# Patient Record
Sex: Female | Born: 1945 | Race: White | Hispanic: No | Marital: Married | Smoking: Former smoker
Health system: Southern US, Community
[De-identification: ages and names within clinical notes are randomized; demographics above are authoritative.]

## PROBLEM LIST (undated history)

## (undated) DIAGNOSIS — C801 Malignant (primary) neoplasm, unspecified: Secondary | ICD-10-CM

## (undated) DIAGNOSIS — R945 Abnormal results of liver function studies: Secondary | ICD-10-CM

## (undated) DIAGNOSIS — K5730 Diverticulosis of large intestine without perforation or abscess without bleeding: Secondary | ICD-10-CM

## (undated) DIAGNOSIS — R7989 Other specified abnormal findings of blood chemistry: Secondary | ICD-10-CM

## (undated) DIAGNOSIS — I10 Essential (primary) hypertension: Secondary | ICD-10-CM

## (undated) DIAGNOSIS — Z853 Personal history of malignant neoplasm of breast: Secondary | ICD-10-CM

## (undated) DIAGNOSIS — K219 Gastro-esophageal reflux disease without esophagitis: Secondary | ICD-10-CM

## (undated) DIAGNOSIS — I719 Aortic aneurysm of unspecified site, without rupture: Secondary | ICD-10-CM

## (undated) DIAGNOSIS — E785 Hyperlipidemia, unspecified: Secondary | ICD-10-CM

## (undated) HISTORY — PX: UPPER GASTROINTESTINAL ENDOSCOPY: SHX188

## (undated) HISTORY — DX: Gastro-esophageal reflux disease without esophagitis: K21.9

## (undated) HISTORY — DX: Aortic aneurysm of unspecified site, without rupture: I71.9

## (undated) HISTORY — PX: COLONOSCOPY: SHX174

## (undated) HISTORY — DX: Essential (primary) hypertension: I10

## (undated) HISTORY — DX: Personal history of malignant neoplasm of breast: Z85.3

## (undated) HISTORY — DX: Hyperlipidemia, unspecified: E78.5

## (undated) HISTORY — DX: Other specified abnormal findings of blood chemistry: R79.89

## (undated) HISTORY — DX: Abnormal results of liver function studies: R94.5

## (undated) HISTORY — DX: Diverticulosis of large intestine without perforation or abscess without bleeding: K57.30

---

## 1993-09-02 DIAGNOSIS — Z853 Personal history of malignant neoplasm of breast: Secondary | ICD-10-CM

## 1993-09-02 HISTORY — DX: Personal history of malignant neoplasm of breast: Z85.3

## 1993-09-02 HISTORY — PX: MASTECTOMY: SHX3

## 1998-07-05 ENCOUNTER — Other Ambulatory Visit: Admission: RE | Admit: 1998-07-05 | Discharge: 1998-07-05 | Payer: Self-pay | Admitting: *Deleted

## 1998-12-27 ENCOUNTER — Encounter: Admission: RE | Admit: 1998-12-27 | Discharge: 1999-03-27 | Payer: Self-pay | Admitting: Orthopedic Surgery

## 1999-06-06 ENCOUNTER — Other Ambulatory Visit: Admission: RE | Admit: 1999-06-06 | Discharge: 1999-06-06 | Payer: Self-pay | Admitting: *Deleted

## 1999-08-16 ENCOUNTER — Encounter: Payer: Self-pay | Admitting: *Deleted

## 1999-08-16 ENCOUNTER — Encounter: Admission: RE | Admit: 1999-08-16 | Discharge: 1999-08-16 | Payer: Self-pay | Admitting: *Deleted

## 2000-06-11 ENCOUNTER — Other Ambulatory Visit: Admission: RE | Admit: 2000-06-11 | Discharge: 2000-06-11 | Payer: Self-pay | Admitting: *Deleted

## 2001-05-25 ENCOUNTER — Other Ambulatory Visit: Admission: RE | Admit: 2001-05-25 | Discharge: 2001-05-25 | Payer: Self-pay | Admitting: *Deleted

## 2002-06-25 ENCOUNTER — Other Ambulatory Visit: Admission: RE | Admit: 2002-06-25 | Discharge: 2002-06-25 | Payer: Self-pay | Admitting: *Deleted

## 2002-07-27 ENCOUNTER — Encounter: Payer: Self-pay | Admitting: Internal Medicine

## 2002-07-27 ENCOUNTER — Encounter: Admission: RE | Admit: 2002-07-27 | Discharge: 2002-07-27 | Payer: Self-pay | Admitting: Internal Medicine

## 2003-08-09 ENCOUNTER — Encounter: Admission: RE | Admit: 2003-08-09 | Discharge: 2003-08-09 | Payer: Self-pay | Admitting: Sports Medicine

## 2003-08-12 ENCOUNTER — Other Ambulatory Visit: Admission: RE | Admit: 2003-08-12 | Discharge: 2003-08-12 | Payer: Self-pay | Admitting: *Deleted

## 2003-09-20 ENCOUNTER — Encounter: Admission: RE | Admit: 2003-09-20 | Discharge: 2003-09-20 | Payer: Self-pay | Admitting: Sports Medicine

## 2004-06-28 ENCOUNTER — Encounter: Payer: Self-pay | Admitting: Internal Medicine

## 2004-07-18 ENCOUNTER — Other Ambulatory Visit: Admission: RE | Admit: 2004-07-18 | Discharge: 2004-07-18 | Payer: Self-pay | Admitting: *Deleted

## 2004-08-08 ENCOUNTER — Ambulatory Visit: Payer: Self-pay | Admitting: Internal Medicine

## 2004-10-29 ENCOUNTER — Encounter: Admission: RE | Admit: 2004-10-29 | Discharge: 2004-10-29 | Payer: Self-pay | Admitting: Orthopaedic Surgery

## 2005-06-06 ENCOUNTER — Other Ambulatory Visit: Admission: RE | Admit: 2005-06-06 | Discharge: 2005-06-06 | Payer: Self-pay | Admitting: *Deleted

## 2005-07-04 ENCOUNTER — Ambulatory Visit: Payer: Self-pay | Admitting: Internal Medicine

## 2005-07-17 ENCOUNTER — Ambulatory Visit: Payer: Self-pay | Admitting: Internal Medicine

## 2005-12-23 ENCOUNTER — Ambulatory Visit: Payer: Self-pay | Admitting: Internal Medicine

## 2006-06-02 LAB — CONVERTED CEMR LAB: Pap Smear: NORMAL

## 2006-06-17 ENCOUNTER — Ambulatory Visit: Payer: Self-pay | Admitting: Internal Medicine

## 2006-06-17 LAB — CONVERTED CEMR LAB
ALT: 29 units/L (ref 0–40)
AST: 32 units/L (ref 0–37)
Albumin: 4 g/dL (ref 3.5–5.2)
Alkaline Phosphatase: 51 units/L (ref 39–117)
BUN: 17 mg/dL (ref 6–23)
Basophils Absolute: 0 10*3/uL (ref 0.0–0.1)
Basophils Relative: 0.6 % (ref 0.0–1.0)
CO2: 27 meq/L (ref 19–32)
Calcium: 9.6 mg/dL (ref 8.4–10.5)
Chloride: 106 meq/L (ref 96–112)
Chol/HDL Ratio, serum: 3.2
Cholesterol: 237 mg/dL (ref 0–200)
Creatinine, Ser: 1 mg/dL (ref 0.4–1.2)
Eosinophil percent: 1.6 % (ref 0.0–5.0)
GFR calc non Af Amer: 60 mL/min
Glomerular Filtration Rate, Af Am: 73 mL/min/{1.73_m2}
Glucose, Bld: 82 mg/dL (ref 70–99)
HCT: 43.1 % (ref 36.0–46.0)
HDL: 73.7 mg/dL (ref >39.0)
Hemoglobin: 14.6 g/dL (ref 12.0–15.0)
LDL DIRECT: 147.7 mg/dL
Lymphocytes Relative: 30.9 % (ref 12.0–46.0)
MCHC: 33.9 g/dL (ref 30.0–36.0)
MCV: 92.7 fL (ref 78.0–100.0)
Monocytes Absolute: 0.2 10*3/uL (ref 0.2–0.7)
Monocytes Relative: 6 % (ref 3.0–11.0)
Neutro Abs: 1.9 10*3/uL (ref 1.4–7.7)
Neutrophils Relative %: 60.9 % (ref 43.0–77.0)
Platelets: 210 10*3/uL (ref 150–400)
Potassium: 4.3 meq/L (ref 3.5–5.1)
RBC: 4.65 M/uL (ref 3.87–5.11)
RDW: 11.9 % (ref 11.5–14.6)
Sodium: 141 meq/L (ref 135–145)
TSH: 1.88 microintl units/mL (ref 0.35–5.50)
Total Bilirubin: 1.7 mg/dL — ABNORMAL HIGH (ref 0.3–1.2)
Total Protein: 6.6 g/dL (ref 6.0–8.3)
Triglyceride fasting, serum: 48 mg/dL (ref 0–149)
VLDL: 10 mg/dL (ref 0–40)
WBC: 3.2 10*3/uL — ABNORMAL LOW (ref 4.5–10.5)

## 2006-06-23 ENCOUNTER — Ambulatory Visit: Payer: Self-pay | Admitting: Internal Medicine

## 2006-06-23 DIAGNOSIS — E785 Hyperlipidemia, unspecified: Secondary | ICD-10-CM | POA: Insufficient documentation

## 2006-06-23 DIAGNOSIS — Z853 Personal history of malignant neoplasm of breast: Secondary | ICD-10-CM | POA: Insufficient documentation

## 2006-07-28 ENCOUNTER — Other Ambulatory Visit: Admission: RE | Admit: 2006-07-28 | Discharge: 2006-07-28 | Payer: Self-pay | Admitting: Obstetrics & Gynecology

## 2007-06-17 ENCOUNTER — Ambulatory Visit: Payer: Self-pay | Admitting: Internal Medicine

## 2007-06-17 LAB — CONVERTED CEMR LAB
ALT: 25 units/L (ref 0–35)
AST: 30 units/L (ref 0–37)
Albumin: 3.9 g/dL (ref 3.5–5.2)
Alkaline Phosphatase: 66 units/L (ref 39–117)
BUN: 14 mg/dL (ref 6–23)
Basophils Absolute: 0 10*3/uL (ref 0.0–0.1)
Basophils Relative: 0.6 % (ref 0.0–1.0)
Bilirubin Urine: NEGATIVE
Bilirubin, Direct: 0.3 mg/dL (ref 0.0–0.3)
Blood in Urine, dipstick: NEGATIVE
CO2: 26 meq/L (ref 19–32)
Calcium: 9.2 mg/dL (ref 8.4–10.5)
Chloride: 107 meq/L (ref 96–112)
Cholesterol: 209 mg/dL (ref 0–200)
Creatinine, Ser: 1 mg/dL (ref 0.4–1.2)
Direct LDL: 123.8 mg/dL
Eosinophils Absolute: 0.3 10*3/uL (ref 0.0–0.6)
Eosinophils Relative: 9.2 % — ABNORMAL HIGH (ref 0.0–5.0)
GFR calc Af Amer: 72 mL/min
GFR calc non Af Amer: 60 mL/min
Glucose, Bld: 87 mg/dL (ref 70–99)
Glucose, Urine, Semiquant: NEGATIVE
HCT: 41.4 % (ref 36.0–46.0)
HDL: 68 mg/dL (ref >39.0)
Hemoglobin: 14.2 g/dL (ref 12.0–15.0)
Ketones, urine, test strip: NEGATIVE
Lymphocytes Relative: 43.4 % (ref 12.0–46.0)
MCHC: 34.4 g/dL (ref 30.0–36.0)
MCV: 92 fL (ref 78.0–100.0)
Monocytes Absolute: 0.3 10*3/uL (ref 0.2–0.7)
Monocytes Relative: 6.8 % (ref 3.0–11.0)
Neutro Abs: 1.5 10*3/uL (ref 1.4–7.7)
Neutrophils Relative %: 40 % — ABNORMAL LOW (ref 43.0–77.0)
Nitrite: NEGATIVE
Platelets: 176 10*3/uL (ref 150–400)
Potassium: 4.3 meq/L (ref 3.5–5.1)
Protein, U semiquant: NEGATIVE
RBC: 4.5 M/uL (ref 3.87–5.11)
RDW: 11.8 % (ref 11.5–14.6)
Sodium: 141 meq/L (ref 135–145)
Specific Gravity, Urine: 1.02
TSH: 2.52 microintl units/mL (ref 0.35–5.50)
Total Bilirubin: 1.8 mg/dL — ABNORMAL HIGH (ref 0.3–1.2)
Total CHOL/HDL Ratio: 3.1
Total Protein: 5.9 g/dL — ABNORMAL LOW (ref 6.0–8.3)
Triglycerides: 63 mg/dL (ref 0–149)
Urobilinogen, UA: 0.2
VLDL: 13 mg/dL (ref 0–40)
WBC: 3.7 10*3/uL — ABNORMAL LOW (ref 4.5–10.5)
pH: 6

## 2007-06-25 ENCOUNTER — Ambulatory Visit: Payer: Self-pay | Admitting: Internal Medicine

## 2007-06-29 ENCOUNTER — Other Ambulatory Visit: Admission: RE | Admit: 2007-06-29 | Discharge: 2007-06-29 | Payer: Self-pay | Admitting: Obstetrics & Gynecology

## 2007-07-08 ENCOUNTER — Encounter: Admission: RE | Admit: 2007-07-08 | Discharge: 2007-07-08 | Payer: Self-pay | Admitting: Obstetrics & Gynecology

## 2007-07-08 ENCOUNTER — Encounter: Payer: Self-pay | Admitting: Internal Medicine

## 2007-09-14 ENCOUNTER — Ambulatory Visit: Payer: Self-pay | Admitting: Gastroenterology

## 2007-09-24 ENCOUNTER — Ambulatory Visit: Payer: Self-pay | Admitting: Gastroenterology

## 2007-09-24 ENCOUNTER — Encounter: Payer: Self-pay | Admitting: Internal Medicine

## 2008-05-30 ENCOUNTER — Ambulatory Visit: Payer: Self-pay | Admitting: Family Medicine

## 2008-06-23 ENCOUNTER — Ambulatory Visit: Payer: Self-pay | Admitting: Internal Medicine

## 2008-06-23 LAB — CONVERTED CEMR LAB
Albumin: 3.9 g/dL (ref 3.5–5.2)
Alkaline Phosphatase: 63 units/L (ref 39–117)
BUN: 16 mg/dL (ref 6–23)
Bilirubin Urine: NEGATIVE
Calcium: 9.3 mg/dL (ref 8.4–10.5)
Cholesterol: 216 mg/dL (ref 0–200)
Direct LDL: 106.3 mg/dL
Eosinophils Absolute: 0.2 10*3/uL (ref 0.0–0.7)
Eosinophils Relative: 5.5 % — ABNORMAL HIGH (ref 0.0–5.0)
GFR calc Af Amer: 81 mL/min
GFR calc non Af Amer: 67 mL/min
Glucose, Bld: 83 mg/dL (ref 70–99)
HCT: 38.2 % (ref 36.0–46.0)
Hemoglobin: 13.6 g/dL (ref 12.0–15.0)
Ketones, urine, test strip: NEGATIVE
MCV: 90 fL (ref 78.0–100.0)
Monocytes Absolute: 0.3 10*3/uL (ref 0.1–1.0)
Neutro Abs: 1.7 10*3/uL (ref 1.4–7.7)
Platelets: 155 10*3/uL (ref 150–400)
Potassium: 3.8 meq/L (ref 3.5–5.1)
RDW: 11.9 % (ref 11.5–14.6)
Specific Gravity, Urine: 1.02
TSH: 2.25 microintl units/mL (ref 0.35–5.50)
Total Protein: 6.5 g/dL (ref 6.0–8.3)
Triglycerides: 47 mg/dL (ref 0–149)
WBC: 3.7 10*3/uL — ABNORMAL LOW (ref 4.5–10.5)

## 2008-06-30 ENCOUNTER — Ambulatory Visit: Payer: Self-pay | Admitting: Internal Medicine

## 2008-06-30 ENCOUNTER — Other Ambulatory Visit: Admission: RE | Admit: 2008-06-30 | Discharge: 2008-06-30 | Payer: Self-pay | Admitting: Obstetrics & Gynecology

## 2009-06-13 ENCOUNTER — Ambulatory Visit: Payer: Self-pay | Admitting: Internal Medicine

## 2009-06-14 LAB — CONVERTED CEMR LAB
Albumin: 3.9 g/dL (ref 3.5–5.2)
Alkaline Phosphatase: 45 units/L (ref 39–117)
BUN: 16 mg/dL (ref 6–23)
Basophils Absolute: 0 10*3/uL (ref 0.0–0.1)
Basophils Relative: 1 % (ref 0.0–3.0)
Bilirubin, Direct: 0.3 mg/dL (ref 0.0–0.3)
CO2: 26 meq/L (ref 19–32)
Calcium: 9.1 mg/dL (ref 8.4–10.5)
Chloride: 106 meq/L (ref 96–112)
Cholesterol: 220 mg/dL — ABNORMAL HIGH (ref 0–200)
Creatinine, Ser: 0.9 mg/dL (ref 0.4–1.2)
Eosinophils Absolute: 0.3 10*3/uL (ref 0.0–0.7)
Glucose, Bld: 83 mg/dL (ref 70–99)
Hemoglobin, Urine: NEGATIVE
Lymphocytes Relative: 51.1 % — ABNORMAL HIGH (ref 12.0–46.0)
MCHC: 34.3 g/dL (ref 30.0–36.0)
MCV: 93.3 fL (ref 78.0–100.0)
Monocytes Absolute: 0.3 10*3/uL (ref 0.1–1.0)
Neutrophils Relative %: 30.2 % — ABNORMAL LOW (ref 43.0–77.0)
Nitrite: NEGATIVE
RBC: 4.33 M/uL (ref 3.87–5.11)
RDW: 11.9 % (ref 11.5–14.6)
Specific Gravity, Urine: 1.015 (ref 1.000–1.030)
Total Protein, Urine: NEGATIVE mg/dL
Total Protein: 6.4 g/dL (ref 6.0–8.3)
Triglycerides: 45 mg/dL (ref 0.0–149.0)
Urine Glucose: NEGATIVE mg/dL
Urobilinogen, UA: 0.2 (ref 0.0–1.0)

## 2009-06-19 ENCOUNTER — Ambulatory Visit: Payer: Self-pay | Admitting: Internal Medicine

## 2009-06-20 ENCOUNTER — Encounter: Payer: Self-pay | Admitting: Internal Medicine

## 2009-06-21 ENCOUNTER — Ambulatory Visit: Payer: Self-pay | Admitting: Internal Medicine

## 2010-09-02 DIAGNOSIS — I10 Essential (primary) hypertension: Secondary | ICD-10-CM

## 2010-09-02 HISTORY — DX: Essential (primary) hypertension: I10

## 2010-09-22 ENCOUNTER — Encounter: Payer: Self-pay | Admitting: Orthopaedic Surgery

## 2010-11-08 ENCOUNTER — Encounter: Payer: Self-pay | Admitting: Gastroenterology

## 2010-11-14 ENCOUNTER — Telehealth: Payer: Self-pay | Admitting: Gastroenterology

## 2010-11-19 ENCOUNTER — Telehealth: Payer: Self-pay | Admitting: Gastroenterology

## 2010-11-20 ENCOUNTER — Ambulatory Visit (INDEPENDENT_AMBULATORY_CARE_PROVIDER_SITE_OTHER): Payer: BC Managed Care – PPO | Admitting: Gastroenterology

## 2010-11-20 ENCOUNTER — Encounter: Payer: Self-pay | Admitting: Gastroenterology

## 2010-11-20 ENCOUNTER — Other Ambulatory Visit (INDEPENDENT_AMBULATORY_CARE_PROVIDER_SITE_OTHER): Payer: BC Managed Care – PPO | Admitting: Gastroenterology

## 2010-11-20 DIAGNOSIS — I10 Essential (primary) hypertension: Secondary | ICD-10-CM

## 2010-11-20 DIAGNOSIS — R079 Chest pain, unspecified: Secondary | ICD-10-CM

## 2010-11-20 DIAGNOSIS — K589 Irritable bowel syndrome without diarrhea: Secondary | ICD-10-CM

## 2010-11-20 DIAGNOSIS — K219 Gastro-esophageal reflux disease without esophagitis: Secondary | ICD-10-CM

## 2010-11-20 MED ORDER — DEXLANSOPRAZOLE 60 MG PO CPDR: 60.0000 mg | DELAYED_RELEASE_CAPSULE | Freq: Every day | ORAL | Status: DC

## 2010-11-20 NOTE — Patient Instructions (Addendum)
We have referred you to [facility] your appt is with Dr Valera Castle on 12/10/2010 at 10:00am, If this appt is not good for you and you need to reschedule please call there office at [phone]. Their address is [address] Your Patricia Flynn is scheduled for 11/22/2010 at 3pm please have nothing to eat or drink 6 hours prior to your abdominal ultrasound, Please go to [facility]. Please go to the basement today for your labs.  Take Dexilant once a day.

## 2010-11-20 NOTE — Progress Notes (Signed)
History of Present Illness:  This is a  close friend of mine who is a 65 year old patient female. 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  She  Been evaluated by Dr. Waynard Edwards  in internal medicine and placed   on Bystolic  10 mg a day with good control of her hypertension. Previously   she had a negative colonoscopy with Dr. Arlyce Dice several years ago. He is very active and exercises regularly. There is a history of coronary artery disease in her father,andshe does have a history of hyperlipidemia. She has not had previous exercise treadmill testing.    Her atypical chest pain is associated with some xiphoid discomfort  and regurgitation and a sensation of burping. There is no history of dysphasia or any specific hepatobiliary complaints. Because of abdominal gas and bloating she has been placed on Phillip's Colon Health  Probiotics with good success. She specifically denies melena or  rectal bleeding, anorexia or weight loss. There are no associated skin rashes, joint pains, oral stomatitis, or hepatobiliary symptoms. She denies abuse of cigarettes,NSAID's  and uses alcohol socially. There is no previous history of hepatitis or pancreatitis.  The remainder of the 8 point ROS is negative but she does describe dysthesia of her skin, but no palpitations, or other cardiopulmonary symptoms.    Physical Exam: General well developed well nourished patient in no acute distress, appearing their stated age Eyes PERRLA, no icterus fundoscopic exam per opthamologist Skin no lesions noted Neck supple, no adenopathy, no thyroid enlargement, no tenderness Chest clear to percussion and auscultation Heart no significant murmurs, gallops or rubs noted Abdomen no hepatosplenomegaly masses or tenderness, BS normal.   Extremities no acute joint lesions,  edema, phlebitis or evidence of cellulitis. Neurologic patient oriented x 3, cranial nerves intact, no focal neurologic deficits noted. Psychological mental status normal and normal affect.Teary eyed.Marland KitchenMarland KitchenAnxious.  assessment and plan: I suspect some of her symptomatology is related to a hiatal hernia and gastroesophageal reflux disease. I placed her on daily PPI therapy with standard antireflux maneuvers and will check upper abdominal ultrasound exam and screening laboratory parameters to exclude other possible etiologies of her abdominal pain. Her gas and bloating has responded nicely to probiotic therapy, and she does travel extensively. In fact, she recently returned from a prolonged trip to [place]. I'm concerned about her children and hypertension and have ordered 24-hour urines for metanephrines and catecholamines exams. Have advised her to continue her antihypertensive medication and also to use Ativan 1-2 times a day for probable free-floating anxiety. Because of her cardiology appointment possible exercise stress testing to exclude atypical presentation of coronary artery disease despite the fact she had a recent electrocardiogram. She may need upper GI panendoscopy to exclude H. Pylori infection. H. Pylori antibodies have be en ordered. Amylase, lipase, and celiac serologies also ordered. Past Medical History  Diagnosis Date  . Diverticulosis of colon (without mention of hemorrhage)   . History of breast cancer 1995  . Hypertension 2012    newly diagnosed   Past Surgical History  Procedure Date  . Mastectomy 1995    left    reports that she quit smoking about 32 years ago. Her smoking use included Cigarettes. She does not have any smokeless tobacco history on file. She reports that she drinks alcohol. She reports that she does not use illicit drugs. family history  includes Breast cancer (age of onset:82) in her mother and Heart disease in her father. No Known Allergies

## 2010-11-20 NOTE — Progress Notes (Addendum)
Summary: Appts   Phone Note Call from Patient Call back at [phone]   Caller: Patient Call For: Dr. Jarold Motto Reason for Call: Talk to Nurse Summary of Call: Patient says that she is a personal friend of Dr. Jarold Motto and wants to come in to see him on Monday 10/21/10 since he is out of town this week, says she is having some GI issues and needs to discuss them with  him, looks like patient had a colon with Dr. Arlyce Dice in 2009 Initial call taken by: [place] Johnson,  November 14, 2010 1:15 PM  Follow-up for Phone Call        Left a message on patients machine to call back. Harlow Mares CMA (AAMA)  November 14, 2010 1:21 PM Left a message on patients machine to call back. Harlow Mares CMA Duncan Dull)  November 14, 2010 3:07 PM  patient had a colonoscopy with Dr. Arlyce Dice and she states that she is a personal friend of Dr. Janese Banks and she is demanding to be seen on 11/19/2010 by Dr Jarold Motto I have advised her if she is in need of a sooner appt I can send her message to the Nurse to have her triaged and maybe get her in with an extender next week she refuses and states that Dr. Jarold Motto told her to call and get in next week. i have advised her per our Director that we can not over book since we are going live on a new computer system and that we would have to work on the change of provider for her since she is a Magazine features editor patient. She states that she would like me to start the process of changing providers and she will discuss being seen next week with Dr. Jarold Motto since she is a good friend. I told  his first open slot of 12/20/2010 i would be glad to put her in that slot and send her to a nurse to be triaged and she refused.   Dr Arlyce Dice do you ok the switch?? Follow-up by: Harlow Mares CMA Duncan Dull),  November 14, 2010 3:18 PM  Additional Follow-up for Phone Call Additional follow up Details #1::        yes Additional Follow-up by: Louis Meckel MD,  November 16, 2010 2:24 PM     Appended Document: Appts Dr  Jarold Motto do you accept this change to You fom Arlyce Dice?  Appended Document: Appts fdit her in to see me 4;00.Marland KitchenMarland KitchenI will change dr.appt

## 2010-11-21 ENCOUNTER — Ambulatory Visit: Payer: BC Managed Care – PPO

## 2010-11-21 ENCOUNTER — Ambulatory Visit (INDEPENDENT_AMBULATORY_CARE_PROVIDER_SITE_OTHER): Payer: BC Managed Care – PPO | Admitting: Gastroenterology

## 2010-11-21 DIAGNOSIS — E538 Deficiency of other specified B group vitamins: Secondary | ICD-10-CM

## 2010-11-21 DIAGNOSIS — R7989 Other specified abnormal findings of blood chemistry: Secondary | ICD-10-CM

## 2010-11-21 LAB — IBC PANEL: Saturation Ratios: 24.8 % (ref 20.0–50.0)

## 2010-11-21 MED ORDER — CYANOCOBALAMIN 1000 MCG/ML IJ SOLN: 1000.0000 ug | INTRAMUSCULAR | Status: DC

## 2010-11-21 MED ORDER — CYANOCOBALAMIN 1000 MCG/ML IJ SOLN
1000.0000 ug | INTRAMUSCULAR | Status: AC
  Administered 2010-11-21 – 2010-12-05 (×3): 1000 ug via INTRAMUSCULAR

## 2010-11-21 NOTE — Progress Notes (Signed)
Addended by: Harlow Mares on: 11/21/2010 08:38 AM   Modules accepted: Orders

## 2010-11-22 ENCOUNTER — Encounter: Payer: Self-pay | Admitting: Gastroenterology

## 2010-11-22 ENCOUNTER — Ambulatory Visit (HOSPITAL_COMMUNITY)
Admission: RE | Admit: 2010-11-22 | Discharge: 2010-11-22 | Disposition: A | Discharge status: Home / Self Care | Payer: BC Managed Care – PPO | Source: Ambulatory Visit | Attending: Gastroenterology | Admitting: Gastroenterology

## 2010-11-22 ENCOUNTER — Telehealth: Payer: Self-pay | Admitting: Gastroenterology

## 2010-11-22 ENCOUNTER — Other Ambulatory Visit (HOSPITAL_COMMUNITY): Payer: BC Managed Care – PPO

## 2010-11-22 DIAGNOSIS — R079 Chest pain, unspecified: Secondary | ICD-10-CM

## 2010-11-22 DIAGNOSIS — K8020 Calculus of gallbladder without cholecystitis without obstruction: Secondary | ICD-10-CM | POA: Insufficient documentation

## 2010-11-22 DIAGNOSIS — K219 Gastro-esophageal reflux disease without esophagitis: Secondary | ICD-10-CM

## 2010-11-22 DIAGNOSIS — R143 Flatulence: Secondary | ICD-10-CM | POA: Insufficient documentation

## 2010-11-22 DIAGNOSIS — R141 Gas pain: Secondary | ICD-10-CM | POA: Insufficient documentation

## 2010-11-22 DIAGNOSIS — Z853 Personal history of malignant neoplasm of breast: Secondary | ICD-10-CM | POA: Insufficient documentation

## 2010-11-22 DIAGNOSIS — R142 Eructation: Secondary | ICD-10-CM | POA: Insufficient documentation

## 2010-11-22 DIAGNOSIS — K589 Irritable bowel syndrome without diarrhea: Secondary | ICD-10-CM

## 2010-11-22 DIAGNOSIS — I10 Essential (primary) hypertension: Secondary | ICD-10-CM | POA: Insufficient documentation

## 2010-11-22 LAB — CBC WITH DIFFERENTIAL/PLATELET
Basophils Absolute: 0 10*3/uL (ref 0.0–0.1)
Eosinophils Absolute: 0.1 10*3/uL (ref 0.0–0.7)
HCT: 40.3 % (ref 36.0–46.0)
Lymphs Abs: 1.3 10*3/uL (ref 0.7–4.0)
MCV: 92.3 fl (ref 78.0–100.0)
Monocytes Absolute: 0.3 10*3/uL (ref 0.1–1.0)
Platelets: 187 10*3/uL (ref 150.0–400.0)
RDW: 12.7 % (ref 11.5–14.6)

## 2010-11-22 LAB — TSH: TSH: 2.31 u[IU]/mL (ref 0.35–5.50)

## 2010-11-22 LAB — FOLATE: Folate: 19.6 ng/mL (ref >5.9)

## 2010-11-22 LAB — SEDIMENTATION RATE: Sed Rate: 7 mm/hr (ref 0–22)

## 2010-11-22 LAB — HIGH SENSITIVITY CRP: CRP, High Sensitivity: 0.29 mg/L (ref 0.00–5.00)

## 2010-11-22 LAB — BASIC METABOLIC PANEL
Chloride: 104 mEq/L (ref 96–112)
GFR: 66.7 mL/min (ref >60.00)
Glucose, Bld: 90 mg/dL (ref 70–99)
Potassium: 4.2 mEq/L (ref 3.5–5.1)
Sodium: 137 mEq/L (ref 135–145)

## 2010-11-22 LAB — HEPATIC FUNCTION PANEL
ALT: 82 U/L — ABNORMAL HIGH (ref 0–35)
AST: 58 U/L — ABNORMAL HIGH (ref 0–37)
Alkaline Phosphatase: 116 U/L (ref 39–117)
Total Bilirubin: 1.9 mg/dL — ABNORMAL HIGH (ref 0.3–1.2)

## 2010-11-22 NOTE — Telephone Encounter (Signed)
I spoke at length with Patricia Flynn about her labs and radiographic findings. Also spoke with her primary care physician Dr.Perini  And he agrees with our plans. Labs are pending , and we will repeat her liver profile after she avoids ethanol completely for the next 2 weeks.

## 2010-11-22 NOTE — Progress Notes (Signed)
  This encounter was created in error - please disregard This encounter was created in error - please disregard. This encounter was created in error - please disregard. This encounter was created in error - please disregard. This encounter was created in error - please disregard. 

## 2010-11-23 ENCOUNTER — Telehealth: Payer: Self-pay | Admitting: *Deleted

## 2010-11-23 NOTE — Telephone Encounter (Signed)
Per Dr Jarold Motto patient needs LFTs when she comes in for her 3rd b12 injection. I have entered the order.

## 2010-11-28 ENCOUNTER — Ambulatory Visit (INDEPENDENT_AMBULATORY_CARE_PROVIDER_SITE_OTHER): Payer: BC Managed Care – PPO | Admitting: Gastroenterology

## 2010-11-28 DIAGNOSIS — E538 Deficiency of other specified B group vitamins: Secondary | ICD-10-CM

## 2010-11-29 ENCOUNTER — Encounter: Payer: Self-pay | Admitting: Physician Assistant

## 2010-11-29 NOTE — Progress Notes (Signed)
Summary: appt   ---- Converted from flag ---- ---- 11/19/2010 10:58 AM, Mardella Layman MD [facility] wrote: TOMORROW 3;30  ---- 11/19/2010 9:39 AM, Patricia Flynn CMA (AAMA) wrote: per Aurea Graff you can see her tomorrow at 11:30 and you have 6 patients on for that morning or you can see her tomorrow afternoon at 3:15 or 3:30pm since your schedule stopped at 2:45pm...not sure if your off tomorrow afternoon for a meeting. You do not have time today. When would you like to see her tomorrow??  Patricia Flynn ------------------------------  Phone Note Outgoing Call Call back at [phone]   Call placed by: Patricia Flynn CMA Patricia Flynn),  November 19, 2010 11:16 AM Call placed to: Patient Summary of Call: Left a message on patients machine to call back if she has questions but Dr. Jarold Motto will see her on 11/20/2010 at 3:30pm. she was advised to be here at 3:15pm for new pt paperwork.  Initial call taken by: Patricia Flynn CMA (AAMA),  November 19, 2010 11:16 AM

## 2010-11-30 ENCOUNTER — Encounter: Payer: Self-pay | Admitting: Internal Medicine

## 2010-11-30 ENCOUNTER — Encounter: Payer: Self-pay | Admitting: Physician Assistant

## 2010-11-30 ENCOUNTER — Other Ambulatory Visit: Payer: Self-pay | Admitting: *Deleted

## 2010-11-30 ENCOUNTER — Ambulatory Visit (INDEPENDENT_AMBULATORY_CARE_PROVIDER_SITE_OTHER): Payer: BC Managed Care – PPO | Admitting: Physician Assistant

## 2010-11-30 ENCOUNTER — Encounter: Payer: Self-pay | Admitting: Gastroenterology

## 2010-11-30 DIAGNOSIS — I10 Essential (primary) hypertension: Secondary | ICD-10-CM

## 2010-11-30 DIAGNOSIS — R079 Chest pain, unspecified: Secondary | ICD-10-CM | POA: Insufficient documentation

## 2010-11-30 NOTE — Progress Notes (Signed)
History of Present Illness: Primary Cardiologist:  Dr. Valera Castle  Patricia Flynn is a 65 y.o. female referred by Dr. Jarold Motto for chest pain.  She has no known h/o CAD.  She recently returned from a trip to [place] a few weeks ago.  A few days after returning, she developed stomach discomfort with radiation up into her chest.  She felt anxious at times and a feeling of "surging" in her body.  She eventually saw her PCP and was given xanax as she was quite anxious about her symptoms.  Her blood pressure was high.  When she returned for follow up, her BP was still high (140/100) and she was placed on bystolic.  She saw Dr. Jarold Motto and was placed on probiotics and Dexilant.  Her symptoms improved with this.  She is quite active and exercises daily.  She denies any exertional chest pain or dyspnea.  No syncope.  No orthopnea or pnd.  No edema.  She had symptoms again yesterday that occurred shortly after eating.  She is anxious about all of her symptoms.  She has a long h/o left chest pain that she has had since her left mastectomy for breast cancer.  She had blood work with Dr. Jarold Motto and her B12 is low and she is now on replacement.  Her LFTs are also elevated and she has been asked to discontinue all etoh with repeat labs in a couple weeks.  Of note, her creatinine, hgb and tsh were all normal.  Also, ESR and CRP were normal.  Past Medical History  Diagnosis Date  . Diverticulosis of colon (without mention of hemorrhage)   . History of breast cancer 1995  . Hypertension 2012    newly diagnosed  . Elevated LFTs     Current Outpatient Prescriptions  Medication Sig Dispense Refill  . ALPRAZolam (XANAX) 0.5 MG tablet Take 0.5 mg by mouth at bedtime as needed. Take  1/2 (half) qhs prn       . dexlansoprazole (DEXILANT) 60 MG capsule Take 1 capsule (60 mg total) by mouth daily.  30 capsule  6  . Multiple Vitamin (MULTIVITAMIN) tablet Take 1 tablet by mouth as needed.        . nebivolol (BYSTOLIC)  5 MG tablet Take 5 mg by mouth daily.        . Probiotic Product (PHILLIPS COLON HEALTH) CAPS Take by mouth. Take 1 capsule by mouth once daily       . Estradiol 10 MCG TABS Place vaginally as directed. Use two times weekly       . ibuprofen (ADVIL,MOTRIN) 200 MG tablet Take 200 mg by mouth 2 (two) times daily. Take 2 tablets bid        Current Facility-Administered Medications  Medication Dose Route Frequency Provider Last Rate Last Dose  . cyanocobalamin ((VITAMIN B-12)) injection 1,000 mcg  1,000 mcg Intramuscular Weekly Sheryn Bison, MD   1,000 mcg at 11/28/10 0908  . cyanocobalamin ((VITAMIN B-12)) injection 1,000 mcg  1,000 mcg Intramuscular Q30 days Sheryn Bison, MD        No Known Allergies  History  Substance Use Topics  . Smoking status: Former Smoker    Types: Cigarettes    Quit date: 09/02/1978  . Smokeless tobacco: Not on file  . Alcohol Use: 0.0 oz/week     2  0.4 ounces glass red wine per day   Family History  Problem Relation Age of Onset  . Breast cancer Mother 69  . Heart  disease Father     2 MIs    ROS:  See HPI.  No fever, chills, cough, melena, hematochezia, weight changes, skin changes.  All other systems reviewed and negative.  Vital Signs: BP 127/85  Pulse 65  Ht 5\' 6"  (1.676 m)  Wt 139 lb (63.05 kg)  BMI 22.44 kg/m2  PHYSICAL EXAM: Well nourished, well developed, in no acute distress HEENT: normal Neck: no JVD Endocrine: no thyromegaly Lymph: no cervical adenopathy Cardiac:  normal S1, S2; RRR; no murmur Lungs:  clear to auscultation bilaterally, no wheezing, rhonchi or rales Abd: soft, nontender, no hepatomegaly Ext: no edema Skin: warm and dry Neuro:  CNs 2-12 intact, no focal abnormalities noted Vascular: no carotid bruits; DP/PT 2+ bilaterally Psych: normal affect  EKG:  NSR, HR 65, normal axis, NSSTTW changes, RSR prime.  ASSESSMENT AND PLAN:

## 2010-11-30 NOTE — Patient Instructions (Signed)
Your physician has requested that you have en exercise stress myoview. For further information please visit [email or web address]. Please follow instruction sheet, as given.  Needs to have this ASAP per Dr. Daleen Squibb.  Patient prefers Monday or Tuesday of next week.  Your physician recommends that you return for lab work in: D-Dimer to be drawn today.

## 2010-11-30 NOTE — Assessment & Plan Note (Signed)
The patient was seen and evaluated with Dr. Daleen Squibb today.  Her symptoms of chest discomfort are atypical for ischemia.  However, she does have cardiac risk factors of prior smoking, hypertension and family history.  We will proceed with a stress Myoview study to rule out ischemic heart disease.  She does travel quite a bit and her symptoms did start after returning from [place].  We will also get a d-dimer.  If this is abnormal, she will require a chest CT with contrast rule out pulmonary embolism.

## 2010-12-03 ENCOUNTER — Telehealth: Payer: Self-pay | Admitting: Gastroenterology

## 2010-12-03 ENCOUNTER — Ambulatory Visit (HOSPITAL_COMMUNITY): Payer: BC Managed Care – PPO | Attending: Internal Medicine | Admitting: Radiology

## 2010-12-03 ENCOUNTER — Telehealth: Payer: Self-pay | Admitting: *Deleted

## 2010-12-03 DIAGNOSIS — R0789 Other chest pain: Secondary | ICD-10-CM

## 2010-12-03 DIAGNOSIS — R079 Chest pain, unspecified: Secondary | ICD-10-CM | POA: Insufficient documentation

## 2010-12-03 MED ORDER — TECHNETIUM TC 99M TETROFOSMIN IV KIT
11.0000 | PACK | Freq: Once | INTRAVENOUS | Status: AC | PRN
  Administered 2010-12-03: 11 via INTRAVENOUS

## 2010-12-03 MED ORDER — REGADENOSON 0.4 MG/5ML IV SOLN
0.4000 mg | Freq: Once | INTRAVENOUS | Status: AC
  Administered 2010-12-03: 0.4 mg via INTRAVENOUS

## 2010-12-03 MED ORDER — TECHNETIUM TC 99M TETROFOSMIN IV KIT
33.0000 | PACK | Freq: Once | INTRAVENOUS | Status: AC | PRN
  Administered 2010-12-03: 33 via INTRAVENOUS

## 2010-12-03 NOTE — Progress Notes (Signed)
[facility] SITE 3 NUCLEAR MED [address] [place] [address] [id number]  Cardiology Nuclear Med Study  Patricia Flynn is a 65 y.o. female [id number] [birth year] 66 y.o.   Nuclear Med Background Indication for Stress Test:  Evaluation for Ischemia History:  No documented CAD Cardiac Risk Factors: Family History - CAD, History of Smoking, Hypertension and Lipids  Symptoms:  Chest Pain/Pressure with Exertion (last date of chest discomfort was three days ago)   Nuclear Pre-Procedure Caffeine/Decaff Intake:  None NPO After: 8:00pm   Lungs:  Clear IV 0.9% NS with Angio Cath:  20g  IV Site: R Forearm  IV Started by:  Stanton Kidney, EMT-P  Chest Size (in):  34 Cup Size: B  Height: 5\' 6"  (1.676 m)  Weight:  140 lb (63.504 kg)  BMI:  Body mass index is 22.60 kg/(m^2). Tech Comments:  Bystolic held > 36 hours, per patient.    Nuclear Med Study 1 or 2 day study: 1 day  Stress Test Type:  Treadmill/Lexiscan  Reading MD: Dietrich Pates, MD  Order Authorizing Provider:  Valera Castle, MD  Resting Radionuclide: Technetium 61m Tetrofosmin  Resting Radionuclide Dose11 mCi   Stress Radionuclide:  Technetium 69m Tetrofosmin  Stress Radionuclide Dose: 33 mCi           Stress Protocol Rest HR: 60 Stress HR: 97  Rest BP: 133/91 Stress BP: 175/110  Exercise Time (min): 7:31 METS: 4.6          Dose of Adenosine (mg):  n/a Dose of Lexiscan: 0.4 mg  Dose of Atropine (mg): n/a Dose of Dobutamine:  n/a  Stress Test Technologist: [employer], CMA-N  Nuclear Technologist:  Doyne Keel, CNMT     Rest Procedure:  Myocardial perfusion imaging was performed at rest 45 minutes following the intravenous administration of Technetium 81m Tetrofosmin. Rest ECG: Nonspecific T-wave changes.  Stress Procedure:  The patient initially walked the treadmill utilizing the Bruce protocol for 5:30, but was unable to obtain her target heart rate due to a hypertensive response, 175/110.  She  then received IV Lexiscan 0.4 mg over 15-seconds with concurrent low level exercise and then Technetium 63m Tetrofosmin was injected at 30-seconds while the patient continued walking one more minute.  There were no significant changes with Lexiscan.  Quantitative spect images were obtained after a 45-minute delay. Stress ECG: No significant ST segment change suggestive of ischemia.  QPS Raw Data Images:  soft tissue (bowel activity, diaphragm, breast) surround heart. Stress Images:  Normal homogeneous uptake in all areas of the myocardium. Rest Images:  Normal homogeneous uptake in all areas of the myocardium. Subtraction (SDS):  No evidence of ischemia. Transient Ischemic Dilatation (Normal <1.22):  1.09 Lung/Heart Ratio (Normal <0.45):  0.33  Quantitative Gated Spect Images QGS EDV:  86 ml QGS ESV:  30 ml QGS cine images:  NL LV Function; NL Wall Motion QGS EF: 65%  Impression Exercise Capacity:  Lexiscan with low level exercise. BP Response:  Normal blood pressure response. Clinical Symptoms:  No chest pain. ECG Impression:  No significant ST segment change suggestive of ischemia. Comparison with Prior Nuclear Study: No previous nuclear study performed  Overall Impression:  Normal stress nuclear study.  Dietrich Pates

## 2010-12-03 NOTE — Telephone Encounter (Signed)
Pt was waiting for a scheduled Stress Test when called on her cell phone. [phone]. Pt reports muscle weakness and fatigue and twitching since starting Dexilant- she thinks. Pt stated this is the 1st PPI she's been on and that Dr Jarold Motto mentioned [facility] while she was here.  She then asked if the B12 injections could have caused the symptoms. Please advise.

## 2010-12-03 NOTE — Telephone Encounter (Signed)
I DOUBT FROM MEDS.SCHEDULE NEURO APPT ASAP.Marland KitchenMarland KitchenDRP

## 2010-12-04 ENCOUNTER — Telehealth: Payer: Self-pay | Admitting: *Deleted

## 2010-12-04 NOTE — Telephone Encounter (Signed)
Dr Jarold Motto referred pt to Dr Anne Hahn, Neurologist.

## 2010-12-04 NOTE — Progress Notes (Signed)
NUC REPORT ROUTED TO DR. WALL.Mirna Mires

## 2010-12-04 NOTE — Telephone Encounter (Signed)
Pt returning your call

## 2010-12-05 ENCOUNTER — Ambulatory Visit (INDEPENDENT_AMBULATORY_CARE_PROVIDER_SITE_OTHER): Payer: BC Managed Care – PPO | Admitting: Gastroenterology

## 2010-12-05 ENCOUNTER — Other Ambulatory Visit: Payer: Self-pay | Admitting: Gastroenterology

## 2010-12-05 ENCOUNTER — Other Ambulatory Visit (INDEPENDENT_AMBULATORY_CARE_PROVIDER_SITE_OTHER): Payer: BC Managed Care – PPO

## 2010-12-05 DIAGNOSIS — E538 Deficiency of other specified B group vitamins: Secondary | ICD-10-CM

## 2010-12-05 LAB — HEPATIC FUNCTION PANEL
ALT: 38 U/L — ABNORMAL HIGH (ref 0–35)
AST: 32 U/L (ref 0–37)
Albumin: 3.9 g/dL (ref 3.5–5.2)
Alkaline Phosphatase: 86 U/L (ref 39–117)
Total Protein: 6.3 g/dL (ref 6.0–8.3)

## 2010-12-05 MED ORDER — CYANOCOBALAMIN 500 MCG/0.1ML NA SOLN: NASAL | Status: DC

## 2010-12-06 ENCOUNTER — Telehealth: Payer: Self-pay | Admitting: *Deleted

## 2010-12-06 ENCOUNTER — Ambulatory Visit: Payer: BC Managed Care – PPO | Admitting: Physician Assistant

## 2010-12-06 NOTE — Telephone Encounter (Signed)
Left message on personal voice mail nuc med results were normal. Mylo Red RN

## 2010-12-07 NOTE — Progress Notes (Signed)
Addended by: Danielle Rankin on: 12/07/2010 01:51 PM   Modules accepted: Orders

## 2010-12-10 ENCOUNTER — Encounter: Payer: Self-pay | Admitting: Gastroenterology

## 2010-12-10 ENCOUNTER — Institutional Professional Consult (permissible substitution): Payer: BC Managed Care – PPO | Admitting: Cardiology

## 2010-12-11 LAB — CARBOHYDRATE DEFICIENT TRANSFERRIN: CDT: 1.1 % (ref <=1.6)

## 2010-12-13 ENCOUNTER — Ambulatory Visit (INDEPENDENT_AMBULATORY_CARE_PROVIDER_SITE_OTHER): Payer: BC Managed Care – PPO | Admitting: Cardiology

## 2010-12-13 ENCOUNTER — Encounter: Payer: Self-pay | Admitting: Cardiology

## 2010-12-13 DIAGNOSIS — R079 Chest pain, unspecified: Secondary | ICD-10-CM

## 2010-12-13 DIAGNOSIS — I10 Essential (primary) hypertension: Secondary | ICD-10-CM | POA: Insufficient documentation

## 2010-12-13 NOTE — Telephone Encounter (Signed)
done

## 2010-12-13 NOTE — Progress Notes (Signed)
   Patient ID: Patricia Flynn, female    DOB: 08-Feb-1946, 65 y.o.   MRN: [id number]  HPI  Patricia Flynn returns for follow up of her chest pain and hypertension. Her stress Myoview was normal except for a hypertensive response to exercise. I reviewed the results with her today. She rarelly has chest discomfort which responds to prn Pepcid. Her BP has been good except for the fact she thinks the Bystolic is causing her to twitch. Dr Waynard Edwards is aware of this and nuerological evaluation by Dr Anne Hahn was unremarkable according to her.   Review of Systems  All other systems reviewed and are negative.      Physical Exam  Constitutional: She is oriented to person, place, and time. She appears well-developed and well-nourished. No distress.  HENT:  Head: Normocephalic and atraumatic.  Eyes: EOM are normal. Pupils are equal, round, and reactive to light.  Neck: Neck supple. No JVD present. No tracheal deviation present. No thyromegaly present.  Cardiovascular: Normal rate, regular rhythm, normal heart sounds and intact distal pulses.   Musculoskeletal: Normal range of motion. She exhibits no edema.  Neurological: She is alert and oriented to person, place, and time.  Skin: Skin is warm.  Psychiatric:       Anxious

## 2010-12-13 NOTE — Patient Instructions (Signed)
Your physician recommends that you schedule a follow-up appointment in: follow up as needed as per Dr. Daleen Squibb

## 2010-12-13 NOTE — Assessment & Plan Note (Signed)
GERD. Asked her to take Pepcid daily for one month.

## 2010-12-13 NOTE — Assessment & Plan Note (Signed)
Good control. Reassured her that her twitching most likely not related. Follow up with Dr Waynard Edwards.

## 2010-12-20 NOTE — Progress Notes (Signed)
Addended by: Danielle Rankin on: 12/20/2010 03:33 PM   Modules accepted: Orders

## 2011-01-09 ENCOUNTER — Encounter: Payer: Self-pay | Admitting: Gastroenterology

## 2011-07-03 ENCOUNTER — Other Ambulatory Visit: Payer: Self-pay | Admitting: Gastroenterology

## 2011-07-09 ENCOUNTER — Other Ambulatory Visit: Payer: Self-pay | Admitting: Gastroenterology

## 2011-07-09 NOTE — Telephone Encounter (Signed)
Addended by: Florene Glen on: 07/09/2011 02:46 PM   Modules accepted: Orders

## 2011-07-09 NOTE — Telephone Encounter (Signed)
I only received a fax today, but [facility] stated they have sent several faxes. Gave verbal fax to pharmacist.

## 2012-01-14 ENCOUNTER — Telehealth: Payer: Self-pay | Admitting: *Deleted

## 2012-01-14 ENCOUNTER — Encounter: Payer: Self-pay | Admitting: Gastroenterology

## 2012-01-14 MED ORDER — SUCRALFATE 1 GM/10ML PO SUSP: ORAL | Status: DC

## 2012-01-14 MED ORDER — HYOSCYAMINE SULFATE 0.125 MG PO TABS: 0.1250 mg | ORAL_TABLET | Freq: Four times a day (QID) | ORAL | Status: DC | PRN

## 2012-01-14 NOTE — Telephone Encounter (Signed)
rxs called in per Dr Jarold Motto, pt advised she is also in need of a endo in June and she was transferred to [place] to make her direct egd and pre visit appt.

## 2012-02-10 ENCOUNTER — Ambulatory Visit (AMBULATORY_SURGERY_CENTER): Payer: BC Managed Care – PPO | Admitting: *Deleted

## 2012-02-10 ENCOUNTER — Encounter: Payer: Self-pay | Admitting: Gastroenterology

## 2012-02-10 VITALS — Ht 66.0 in | Wt 142.0 lb

## 2012-02-10 DIAGNOSIS — K219 Gastro-esophageal reflux disease without esophagitis: Secondary | ICD-10-CM

## 2012-02-10 DIAGNOSIS — R079 Chest pain, unspecified: Secondary | ICD-10-CM

## 2012-02-17 ENCOUNTER — Ambulatory Visit (AMBULATORY_SURGERY_CENTER): Payer: BC Managed Care – PPO | Admitting: Gastroenterology

## 2012-02-17 ENCOUNTER — Encounter: Payer: BC Managed Care – PPO | Admitting: Gastroenterology

## 2012-02-17 ENCOUNTER — Encounter: Payer: Self-pay | Admitting: Gastroenterology

## 2012-02-17 VITALS — BP 122/91 | HR 66 | Temp 98.0°F | Resp 11 | Ht 66.0 in | Wt 142.0 lb

## 2012-02-17 DIAGNOSIS — K219 Gastro-esophageal reflux disease without esophagitis: Secondary | ICD-10-CM

## 2012-02-17 DIAGNOSIS — K2990 Gastroduodenitis, unspecified, without bleeding: Secondary | ICD-10-CM

## 2012-02-17 DIAGNOSIS — K2970 Gastritis, unspecified, without bleeding: Secondary | ICD-10-CM

## 2012-02-17 DIAGNOSIS — K8020 Calculus of gallbladder without cholecystitis without obstruction: Secondary | ICD-10-CM

## 2012-02-17 DIAGNOSIS — R079 Chest pain, unspecified: Secondary | ICD-10-CM

## 2012-02-17 DIAGNOSIS — D1330 Benign neoplasm of unspecified part of small intestine: Secondary | ICD-10-CM

## 2012-02-17 MED ORDER — SODIUM CHLORIDE 0.9 % IV SOLN: 500.0000 mL | INTRAVENOUS | Status: DC

## 2012-02-17 MED ORDER — SUCRALFATE 1 GM/10ML PO SUSP: ORAL | Status: DC

## 2012-02-17 NOTE — Progress Notes (Signed)
Patient did not have preoperative order for IV antibiotic SSI prophylaxis. (G8918)  Patient did not experience any of the following events: a burn prior to discharge; a fall within the facility; wrong site/side/patient/procedure/implant event; or a hospital transfer or hospital admission upon discharge from the facility. (G8907)  

## 2012-02-17 NOTE — Op Note (Signed)
[facility] [address] [place], [place]  [address]  ENDOSCOPY PROCEDURE REPORT  PATIENT:  Patricia Flynn, Patricia Flynn  MR#:  [id number] BIRTHDATE:  1946/01/23, 65 yrs. old  GENDER:  female  ENDOSCOPIST:  Vania Rea. Jarold Motto, MD, [facility] Referred by:  PROCEDURE DATE:  02/17/2012 PROCEDURE:  EGD with biopsy for H. pylori 62130, EGD with biopsy, 43239 ASA CLASS:  Class II INDICATIONS:  EPIGASTRIC PAIN,HX. OF GALLSTONES.NO RESPONSE TO PPI RX OR ANTISPASMOTICS,CURRENTLY ON LIQUID CARAFATE.  MEDICATIONS:   propofol (Diprivan) 300 mg IV TOPICAL ANESTHETIC:  DESCRIPTION OF PROCEDURE:   After the risks and benefits of the procedure were explained, informed consent was obtained.  The LB GIF-H180 [id number] endoscope was introduced through the mouth and advanced to the second portion of the duodenum.  The instrument was slowly withdrawn as the mucosa was fully examined. <<PROCEDUREIMAGES>>  Normal duodenal folds were noted. SMALL BOWEL BIOPSIES DONE.  Mild gastritis was found in the body and the antrum of the stomach. CLO AND REGULAR BIOPSIES DONE.  irregular Z-line at the gastroesophageal junction. BIOPSIES OF DISTAL ESOPHAGUS DONE.R/O BARRETT'S MUCOSA.NO SIGNIFICENT SIZED HH NOTED.  Otherwise normal esophagus.    Retroflexed views revealed no abnormalities.    The scope was then withdrawn from the patient and the procedure completed.  COMPLICATIONS:  None  ENDOSCOPIC IMPRESSION: 1) Normal duodenal folds 2) Mild gastritis in the body and the antrum of the stomach 3) Irregular Z-line at the gastroesophageal junction 4) Otherwise normal esophagus 1.R/O BARRETT'S MUCOSA FROM CHRONIC GERD 2.R/O H.PYLORI WITH MILD GASTRITIS 3.PROBABLE SYMPTOMATIC GALLSTONES. RECOMMENDATIONS: 1) Await pathology results 2) Rx CLO if positive 3) continue current medications CONSIDER LAPROSCOPIC CHOLECYSTECTOMY.  ______________________________ Vania Rea. Jarold Motto, MD, Clementeen Graham  CC:  Rodrigo Ran, MD, Gaylord Shih, MD  n. Rosalie Doctor:   Vania Rea. Aaliayah Miao at 02/17/2012 11:49 AM  Ruthell Rummage, [phone]

## 2012-02-17 NOTE — Patient Instructions (Addendum)
YOU HAD AN ENDOSCOPIC PROCEDURE TODAY AT THE [facility]: Refer to the procedure report that was given to you for any specific questions about what was found during the examination.  If the procedure report does not answer your questions, please call your gastroenterologist to clarify.  If you requested that your care partner not be given the details of your procedure findings, then the procedure report has been included in a sealed envelope for you to review at your convenience later.  YOU SHOULD EXPECT: Some feelings of bloating in the abdomen. Passage of more gas than usual.  Walking can help get rid of the air that was put into your GI tract during the procedure and reduce the bloating. If you had a lower endoscopy (such as a colonoscopy or flexible sigmoidoscopy) you may notice spotting of blood in your stool or on the toilet paper. If you underwent a bowel prep for your procedure, then you may not have a normal bowel movement for a few days.  DIET: Your first meal following the procedure should be a light meal and then it is ok to progress to your normal diet.  A half-sandwich or bowl of soup is an example of a good first meal.  Heavy or fried foods are harder to digest and may make you feel nauseous or bloated.  Likewise meals heavy in dairy and vegetables can cause extra gas to form and this can also increase the bloating.  Drink plenty of fluids but you should avoid alcoholic beverages for 24 hours.  ACTIVITY: Your care partner should take you home directly after the procedure.  You should plan to take it easy, moving slowly for the rest of the day.  You can resume normal activity the day after the procedure however you should NOT DRIVE or use heavy machinery for 24 hours (because of the sedation medicines used during the test).    SYMPTOMS TO REPORT IMMEDIATELY: A gastroenterologist can be reached at any hour.  During normal business hours, 8:30 AM to 5:00 PM Monday through Friday,  call [phone].  After hours and on weekends, please call the GI answering service at [phone] who will take a message and have the physician on call contact you.  Following upper endoscopy (EGD)  Vomiting of blood or coffee ground material  New chest pain or pain under the shoulder blades  Painful or persistently difficult swallowing  New shortness of breath  Fever of 100F or higher  Black, tarry-looking stools  FOLLOW UP: If any biopsies were taken you will be contacted by phone or by letter within the next 1-3 weeks.  Call your gastroenterologist if you have not heard about the biopsies in 3 weeks.  Our staff will call the home number listed on your records the next business day following your procedure to check on you and address any questions or concerns that you may have at that time regarding the information given to you following your procedure. This is a courtesy call and so if there is no answer at the home number and we have not heard from you through the emergency physician on call, we will assume that you have returned to your regular daily activities without incident.  SIGNATURES/CONFIDENTIALITY: You and/or your care partner have signed paperwork which will be entered into your electronic medical record.  These signatures attest to the fact that that the information above on your After Visit Summary has been reviewed and is understood.  Full responsibility of   the confidentiality of this discharge information lies with you and/or your care-partner. 

## 2012-02-17 NOTE — Progress Notes (Signed)
Pt needed a refill on her Carafate.  Ok per Dr. Jarold Motto received and refill sent to [facility].  Message left on her machine that rx was sent to pharmacy

## 2012-02-18 ENCOUNTER — Telehealth: Payer: Self-pay | Admitting: *Deleted

## 2012-02-18 NOTE — Telephone Encounter (Signed)
  Follow up Call-  Call back number 02/17/2012  Post procedure Call Back phone  # [phone]  Permission to leave phone message Yes     Patient questions:  Do you have a fever, pain , or abdominal swelling? no Pain Score  0 *  Have you tolerated food without any problems? yes  Have you been able to return to your normal activities? yes  Do you have any questions about your discharge instructions: Diet   no Medications  no Follow up visit  no  Do you have questions or concerns about your Care? no  Actions: * If pain score is 4 or above: No action needed, pain <4.

## 2012-02-21 ENCOUNTER — Telehealth: Payer: Self-pay | Admitting: *Deleted

## 2012-02-21 ENCOUNTER — Encounter: Payer: Self-pay | Admitting: Gastroenterology

## 2012-02-21 ENCOUNTER — Telehealth (INDEPENDENT_AMBULATORY_CARE_PROVIDER_SITE_OTHER): Payer: Self-pay | Admitting: General Surgery

## 2012-02-21 NOTE — Telephone Encounter (Signed)
Dr Jamey Ripa is on vacation all next week and unable to see this patient in those time constraints.

## 2012-02-21 NOTE — Telephone Encounter (Signed)
Letter from: Mardella Layman   Please call Mrs. Patricia Flynn and schedule her to see Dr. Jamey Ripa surgery for laparoscopic cholecystectomy.     Informed pt of Dr Jarold Motto' recommendation for a Lap Chole with Dr Maud Deed. Pt had several questions about the surgeons and really wanted to be seen next week with possible surgery the week of the 4th of July.  Informed pt the earliest appt Dr Jamey Ripa has is 03/24/12 at 09:50am, be there at 09:20am, but she is on a cancellation list. Gave pt the number to call to coordinate her schedule.  Dr Jarold Motto, pt wants to know if she continues to take the Carafate? Thanks.

## 2012-02-21 NOTE — Telephone Encounter (Signed)
As needed

## 2012-02-21 NOTE — Telephone Encounter (Signed)
lmom for pt that she may take the Carafate when she needs to; she may call back for questions.

## 2012-02-21 NOTE — Telephone Encounter (Signed)
Message copied by Liliana Cline on Fri Feb 21, 2012 10:51 AM ------      Message from: Marnette Burgess      Created: Fri Feb 21, 2012 10:08 AM      Contact: Dicie Beam from Dr. Norval Gable office called to schedule this patient who claims to be a friend of Dr. Tenna Child and she would really like to be seen sometime between the 02/26/12-02/28/12, [place] is at the beach right now but she will be in town those days and she will not be available anytime in July or August, I believe she will be out of town.  So I gave her the soonest available 03/24/12, and Aram Beecham said she would just tell her to call Dr. Jamey Ripa at home.

## 2012-03-18 ENCOUNTER — Ambulatory Visit (INDEPENDENT_AMBULATORY_CARE_PROVIDER_SITE_OTHER): Payer: Self-pay | Admitting: Surgery

## 2012-03-24 ENCOUNTER — Ambulatory Visit (INDEPENDENT_AMBULATORY_CARE_PROVIDER_SITE_OTHER): Payer: Self-pay | Admitting: Surgery

## 2012-04-07 ENCOUNTER — Encounter (INDEPENDENT_AMBULATORY_CARE_PROVIDER_SITE_OTHER): Payer: Self-pay | Admitting: Surgery

## 2012-04-07 ENCOUNTER — Ambulatory Visit (INDEPENDENT_AMBULATORY_CARE_PROVIDER_SITE_OTHER): Payer: BC Managed Care – PPO | Admitting: Surgery

## 2012-04-07 VITALS — BP 120/80 | HR 60 | Resp 14 | Ht 66.0 in | Wt 140.0 lb

## 2012-04-07 DIAGNOSIS — K8020 Calculus of gallbladder without cholecystitis without obstruction: Secondary | ICD-10-CM

## 2012-04-07 NOTE — Progress Notes (Signed)
Patient ID: Patricia Flynn, female   DOB: 09-Apr-1946, 66 y.o.   MRN: [id number]  Chief Complaint  Patient presents with  . Abdominal Pain    HPI Patricia Flynn is a 66 y.o. female.  She comes over here to discuss possible cholecystectomy. About a year ago she was having some problems with abdominal pain and was evaluated. She was found to have multiple gallstones without evidence of acute cholecystitis. She was also markedly B12 deficient and started on B12 replacement. She basically has done well since then until a few months ago when she started having more pain which is located epigastric. It wasn't necessarily related to food. She also noticed increased bloating and gassiness. She's had a cardiac workup which has been negative as the cause of her pain. The pain seems to be subxiphoid not right upper quadrant. Her gastroenterologist thought she ought t to have a cholecystectomy. However she's been essentially asymptomatic for the last 6 weeks, And is a bit reluctant. She notes that she started on Carafate when her pain began. At the endocervical weeks she had an endoscopy which was essentially negative and she stopped the Carafate but has not had further problems. She also stopped drinking coffee and switch to T. And thinks that may have helped as well. HPI  Past Medical History  Diagnosis Date  . Diverticulosis of colon (without mention of hemorrhage)   . History of breast cancer 1995  . Hypertension 2012    newly diagnosed  . Elevated LFTs     Past Surgical History  Procedure Date  . Mastectomy 1995    left    Family History  Problem Relation Age of Onset  . Breast cancer Mother 59  . Heart disease Father     2 MIs   . Colon cancer Neg Hx   . Stomach cancer Neg Hx     Social History History  Substance Use Topics  . Smoking status: Former Smoker    Types: Cigarettes    Quit date: 09/02/1978  . Smokeless tobacco: Never Used  . Alcohol Use: 4.2 oz/week    7 Glasses of wine  per week     2  0.4 ounces glass red OR WHITE wine per day    No Known Allergies  Current Outpatient Prescriptions  Medication Sig Dispense Refill  . ALPRAZolam (XANAX) 0.5 MG tablet Take 0.5 mg by mouth at bedtime as needed. Take  1/2 (half) qhs prn       . BYSTOLIC 5 MG tablet Take 2.5 mg by mouth Daily.       . Cyanocobalamin (NASCOBAL) 500 MCG/0.1ML SOLN Place 0.1 mLs (500 mcg total) into the nose once a week. Spray in nasal weekly  1.3 mL  3  . ibuprofen (ADVIL,MOTRIN) 200 MG tablet every 6 (six) hours as needed.       . Probiotic Product (PHILLIPS COLON HEALTH) CAPS as needed. Take 1 capsule by mouth once daily      . sucralfate (CARAFATE) 1 GM/10ML suspension Take 2 tsp three times a day as needed  420 mL  1  . VAGIFEM 10 MCG TABS Place 1 suppository vaginally 2 (two) times a week.      . hyoscyamine (LEVSIN) 0.125 MG tablet Take 1 tablet (0.125 mg total) by mouth every 6 (six) hours as needed for cramping.  60 tablet  1    Review of Systems Review of Systems  Constitutional: Negative for fever, chills and unexpected weight change.  HENT:  Negative for hearing loss, congestion, sore throat, trouble swallowing and voice change.   Eyes: Negative for visual disturbance.  Respiratory: Negative for cough and wheezing.   Cardiovascular: Negative for chest pain, palpitations and leg swelling.  Gastrointestinal: Negative for nausea, vomiting, abdominal pain, diarrhea, constipation, blood in stool, abdominal distention and anal bleeding.  Genitourinary: Negative for hematuria, vaginal bleeding and difficulty urinating.  Musculoskeletal: Negative for arthralgias.  Skin: Negative for rash and wound.  Neurological: Negative for seizures, syncope and headaches.  Hematological: Negative for adenopathy. Does not bruise/bleed easily.  Psychiatric/Behavioral: Negative for confusion.    Blood pressure 120/80, pulse 60, resp. rate 14, height 5\' 6"  (1.676 m), weight 140 lb (63.504  kg).  Physical Exam Physical Exam  Vitals reviewed. Constitutional: She is oriented to person, place, and time. She appears well-developed and well-nourished. No distress.  HENT:  Head: Normocephalic and atraumatic.  Mouth/Throat: Oropharynx is clear and moist.  Eyes: Conjunctivae and EOM are normal. Pupils are equal, round, and reactive to light. No scleral icterus.  Neck: Normal range of motion. Neck supple. No tracheal deviation present. No thyromegaly present.  Cardiovascular: Normal rate, regular rhythm, normal heart sounds and intact distal pulses.  Exam reveals no gallop and no friction rub.   No murmur heard. Pulmonary/Chest: Effort normal and breath sounds normal. No respiratory distress. She has no wheezes. She has no rales.  Abdominal: Soft. Bowel sounds are normal. She exhibits no distension and no mass. There is no tenderness. There is no rebound and no guarding.  Musculoskeletal: Normal range of motion. She exhibits no edema and no tenderness.  Neurological: She is alert and oriented to person, place, and time.  Skin: Skin is warm and dry. No rash noted. She is not diaphoretic. No erythema.  Psychiatric: She has a normal mood and affect. Her behavior is normal. Judgment and thought content normal.    Data Reviewed I reviewed her radiology reports, notes in the electronic medical record,And endoscopy notes.  Assessment     Gallstones, probably symptomatic    Plan    I had a long discussion with the patient about options. I favor cholecystectomy as I think some of her symptoms are biliary related although currently she is asymptomatic. We discussed risks and complications and expectations. She would like to defer any surgery until November. Therefore am going to plan to see her back in October to review the situation with her again. If she has any episodes in the meantime she will call me and we will reevaluate the decision to defer surgery.       Iley Breeden  J 04/07/2012, 3:27 PM

## 2012-04-07 NOTE — Patient Instructions (Signed)
See me again in October. If you have any more episodes of pain call me and we will review the situation again.

## 2012-05-25 ENCOUNTER — Encounter (INDEPENDENT_AMBULATORY_CARE_PROVIDER_SITE_OTHER): Payer: Self-pay | Admitting: Surgery

## 2012-05-25 ENCOUNTER — Ambulatory Visit (INDEPENDENT_AMBULATORY_CARE_PROVIDER_SITE_OTHER): Payer: BC Managed Care – PPO | Admitting: Surgery

## 2012-05-25 VITALS — BP 108/62 | HR 60 | Resp 14 | Ht 66.0 in | Wt 140.0 lb

## 2012-05-25 DIAGNOSIS — K8020 Calculus of gallbladder without cholecystitis without obstruction: Secondary | ICD-10-CM

## 2012-05-25 NOTE — Progress Notes (Signed)
Chief complaint: Gallstones  History of present illness: This patient came back to discuss again her gallbladder symptoms and discuss surgery. I saw her last month. She is considering surgery but wished to defer it for a while longer.  Since I saw her last she has had a few episodes of of pain. These are self-limited. One came after eating a Caesar salad and another at 3 right fish and chips. Is that he has a lot of postprandial gas and bloating.  Exam: Deferred today. This is primarily a visit to review her symptoms and discuss possible surgical interventions.  Impression: Gallstones symptomatic  Plan: I recommended that she proceed with cholecystectomy at her convenience. She wishes to wait until January but might have some time on her calendar in late October. She knows she does have some risk for developing acute cholecystitis but her symptoms haven't progressed and she been having it for quite a while so she can probably make it until January without problems.  Once she decides on that date had surgery she will call and we will go ahead and set up to date. She'll see me for a preoperative visit prior to surgery.

## 2012-05-25 NOTE — Patient Instructions (Signed)
Call when you decide to schedule surgery. If you start having more symptoms in the meantime, let me know

## 2012-06-08 IMAGING — US US ABDOMEN COMPLETE
1 series · 14 of 25 positions shown · non-contrast
Comparison: None.

CLINICAL DATA: Abdominal bloating, hypertension, history of breast
carcinoma diagnosed in 6007

COMPLETE ABDOMINAL ULTRASOUND

[Series 1: us abdomen complete · 0.23mm/px · 14 of 75 slices shown]
[im 1/75]
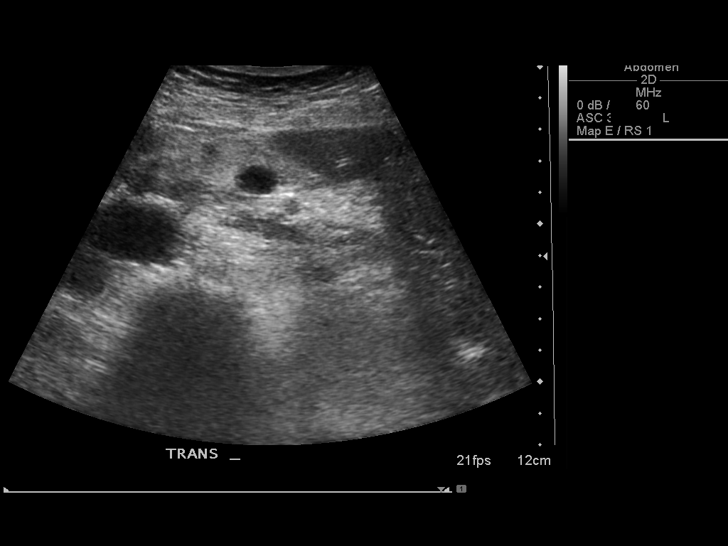
[im 7/75]
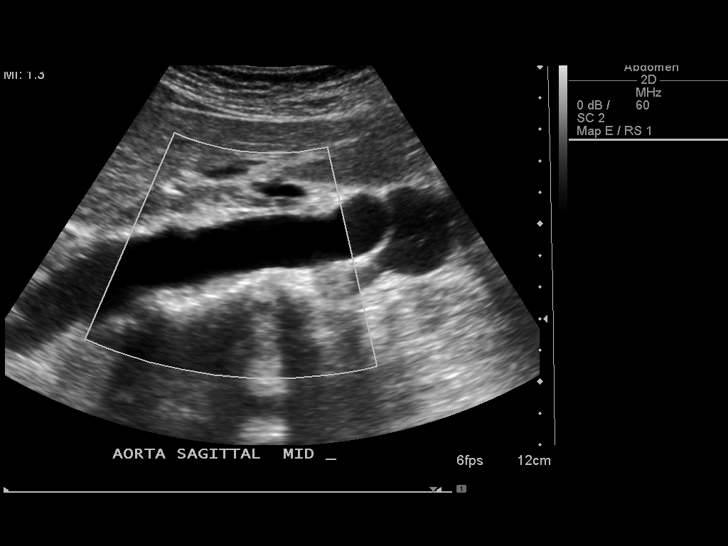
[im 13/75]
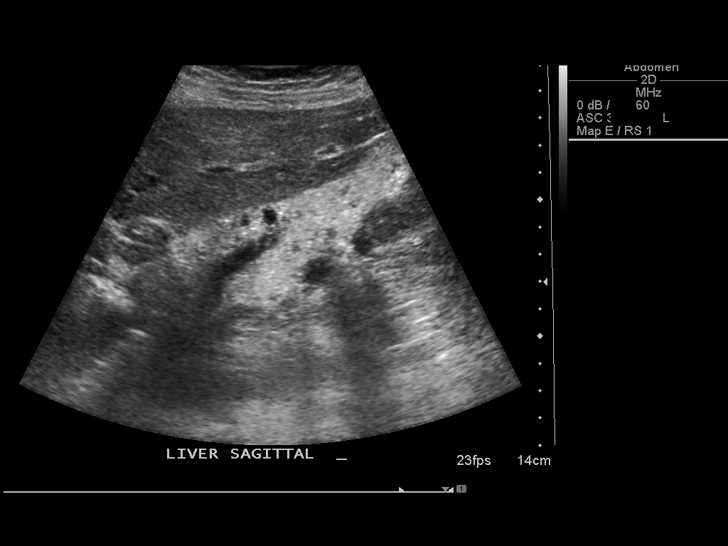
[im 19/75]
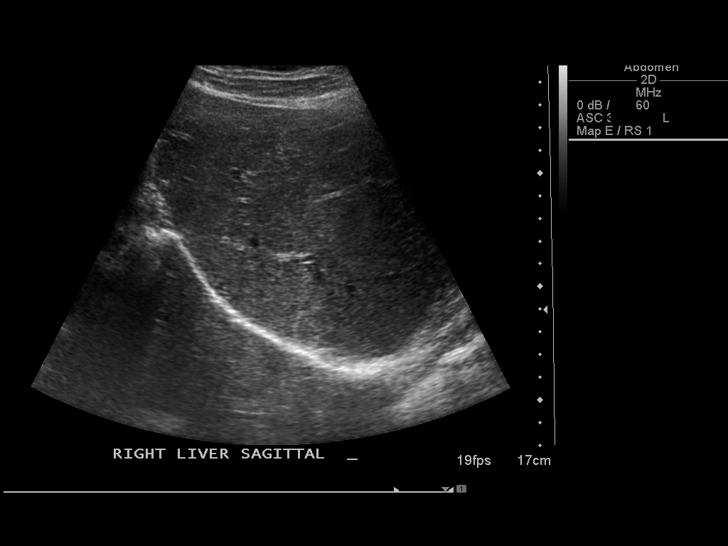
[im 25/75]
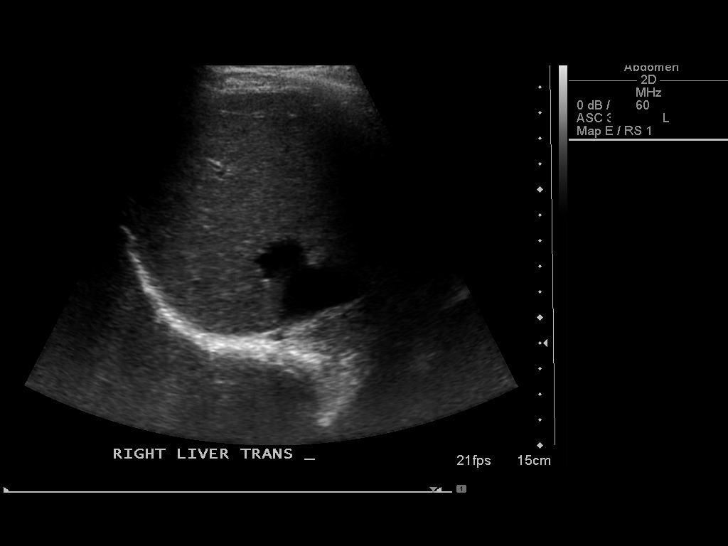
[im 28/75]
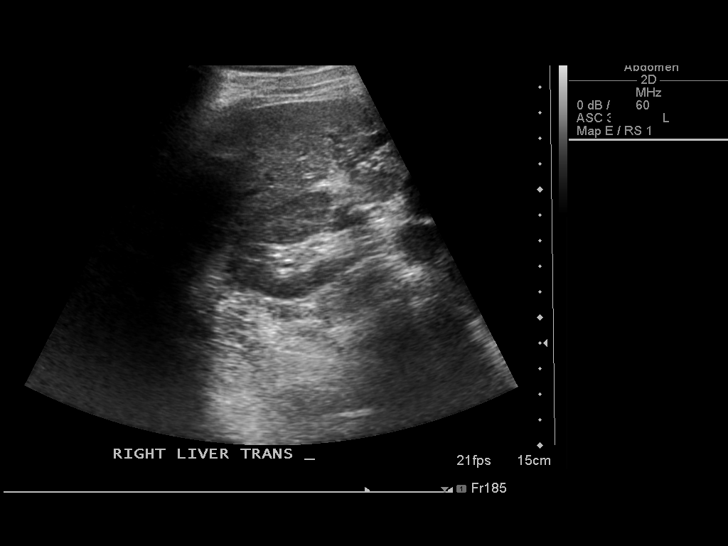
[im 34/75]
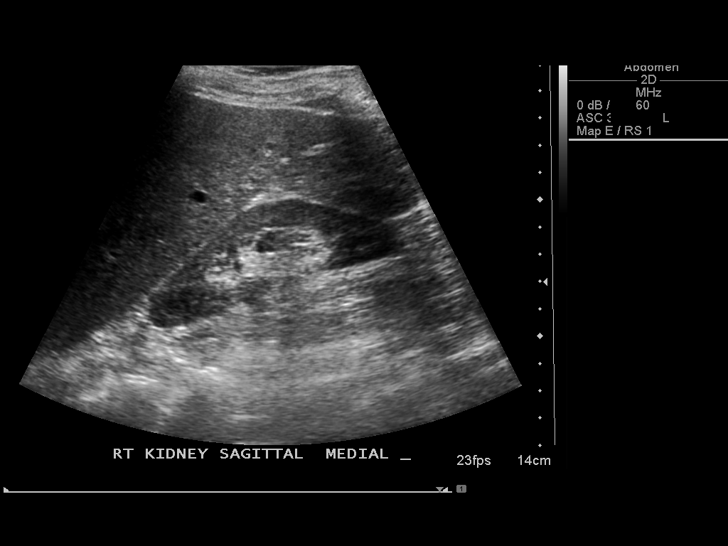
[im 41/75]
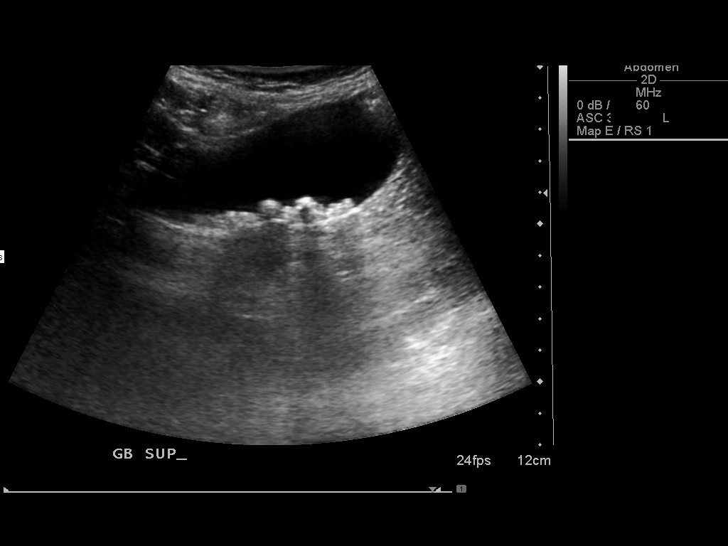
[im 47/75]
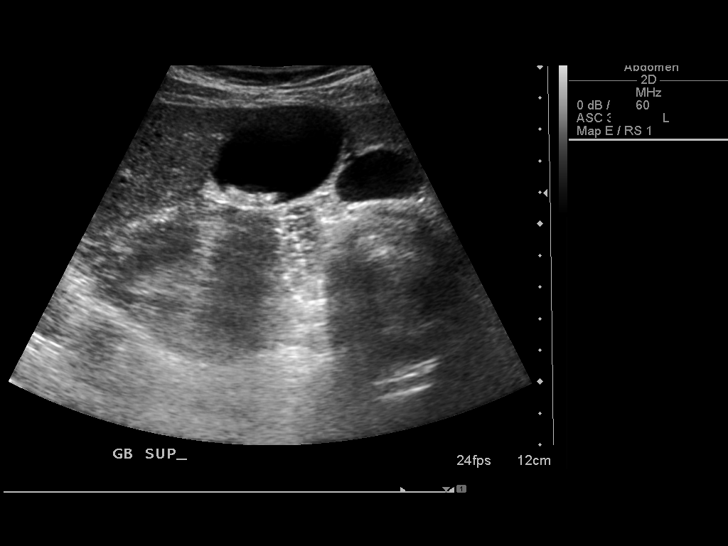
[im 50/75]
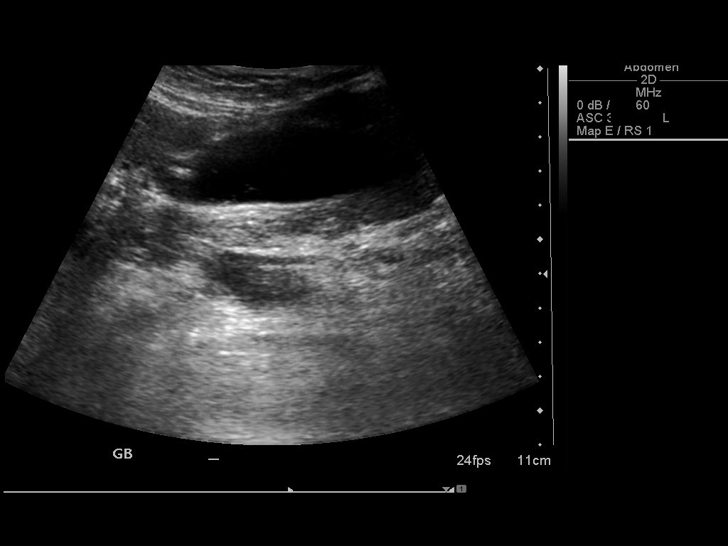
[im 56/75]
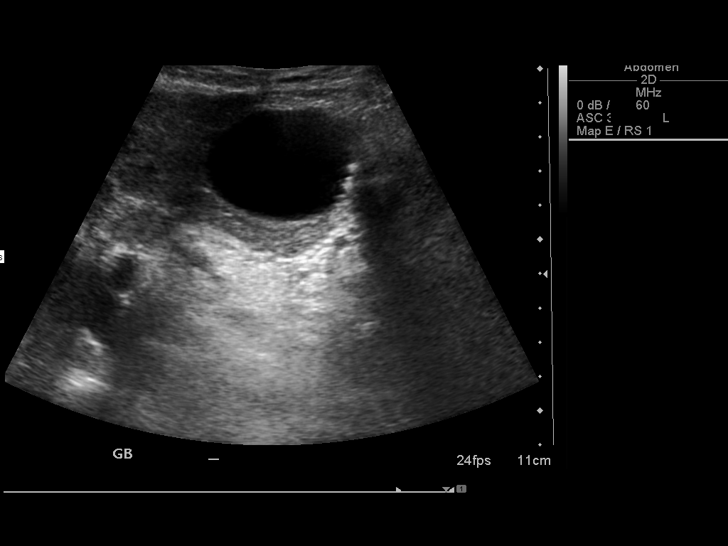
[im 62/75]
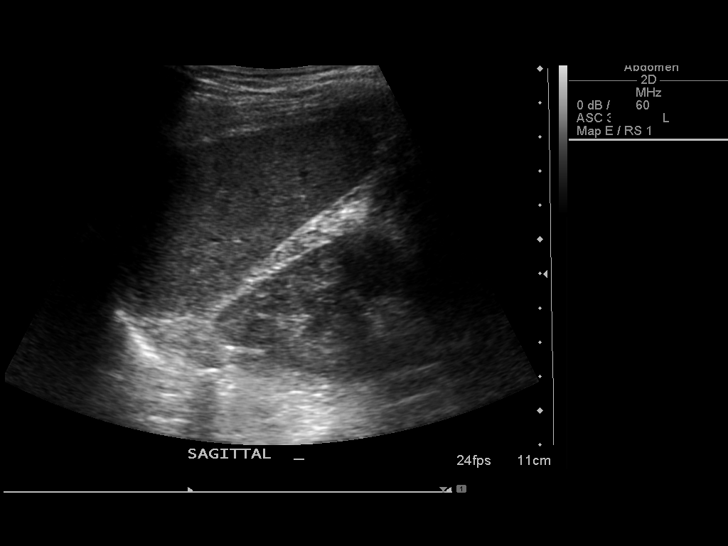
[im 68/75]
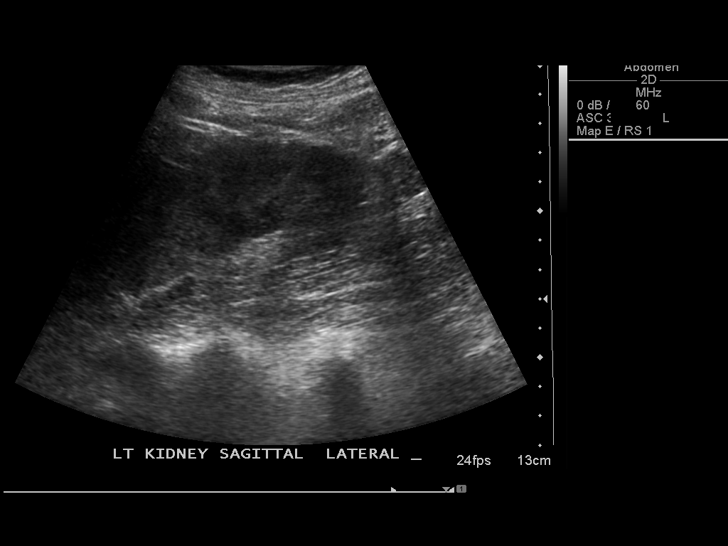
[im 75/75]
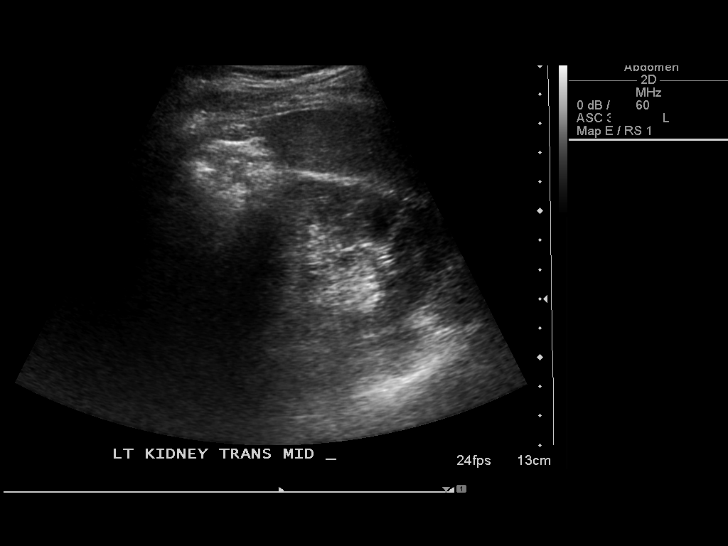

[14 of 25 positions shown; findings below may reference images not displayed]

FINDINGS: Gallbladder:  There are multiple gallstones within the gallbladder.
No pain is present over the gallbladder with compression.

Common bile duct:  The common bile duct is normal measuring 3.1 mm
in diameter.

Liver:  The liver has a normal echogenic pattern.  No ductal
dilatation is seen.

IVC:  Appears normal.

Pancreas:  No focal abnormality seen.

Spleen:  The spleen is normal measuring 7.2 cm sagittally.

Right Kidney:  No hydronephrosis is noted.  The right kidney
measures 10.5 cm sagittally.

Left Kidney:  No hydronephrosis.  The left kidney measures 11.3 cm.

Abdominal aorta:  The abdominal aorta is normal in caliber.
IMPRESSION: 1.  Multiple gallstones.  No pain over the gallbladder.
2.  No ductal dilatation.

## 2012-07-27 ENCOUNTER — Telehealth (INDEPENDENT_AMBULATORY_CARE_PROVIDER_SITE_OTHER): Payer: Self-pay | Admitting: General Surgery

## 2012-07-27 NOTE — Telephone Encounter (Signed)
Preop appt made with patient.  

## 2012-07-27 NOTE — Telephone Encounter (Signed)
Left message on machine for patient to call back and ask for me. Patient needs an appt pre-op. She wants surgery in January with Dr Jamey Ripa for gallbladder removal.

## 2012-09-01 ENCOUNTER — Other Ambulatory Visit (INDEPENDENT_AMBULATORY_CARE_PROVIDER_SITE_OTHER): Payer: Self-pay | Admitting: Surgery

## 2012-09-04 ENCOUNTER — Encounter (INDEPENDENT_AMBULATORY_CARE_PROVIDER_SITE_OTHER): Payer: Self-pay | Admitting: Surgery

## 2012-09-04 ENCOUNTER — Ambulatory Visit (INDEPENDENT_AMBULATORY_CARE_PROVIDER_SITE_OTHER): Payer: BC Managed Care – PPO | Admitting: Surgery

## 2012-09-04 VITALS — BP 104/64 | HR 70 | Temp 98.1°F | Resp 16 | Ht 66.0 in | Wt 141.1 lb

## 2012-09-04 DIAGNOSIS — K8020 Calculus of gallbladder without cholecystitis without obstruction: Secondary | ICD-10-CM

## 2012-09-04 NOTE — Progress Notes (Signed)
Patient ID: Patricia Flynn, female   DOB: 10/17/1945, 66 y.o.   MRN: [id number]  Chief Complaint  Patient presents with  . Pre-op Exam    Lap chole scheduled 09/15/12    HPI Patricia Flynn is a 66 y.o. female.  She comes todasy for preoperative evaluation, having decided to proceed to cholecystectomyShe has had no intercurrent illnesses.  HPI  Past Medical History  Diagnosis Date  . Diverticulosis of colon (without mention of hemorrhage)   . History of breast cancer 1995  . Hypertension 2012    newly diagnosed  . Elevated LFTs     Past Surgical History  Procedure Date  . Mastectomy 1995    left    Family History  Problem Relation Age of Onset  . Breast cancer Mother 82  . Heart disease Father     2 MIs   . Colon cancer Neg Hx   . Stomach cancer Neg Hx     Social History History  Substance Use Topics  . Smoking status: Former Smoker    Types: Cigarettes    Quit date: 09/02/1978  . Smokeless tobacco: Never Used  . Alcohol Use: 4.2 oz/week    7 Glasses of wine per week     Comment: 2  0.4 ounces glass red OR WHITE wine per day    No Known Allergies  Current Outpatient Prescriptions  Medication Sig Dispense Refill  . ALPRAZolam (XANAX) 0.5 MG tablet Take 0.5 mg by mouth at bedtime as needed. Take  1/2 (half) qhs prn      . BYSTOLIC 5 MG tablet Take 2.5 mg by mouth Daily.       . Cyanocobalamin (NASCOBAL) 500 MCG/0.1ML SOLN Place 0.1 mLs (500 mcg total) into the nose once a week. Spray in nasal weekly  1.3 mL  3  . ibuprofen (ADVIL,MOTRIN) 200 MG tablet Take 200 mg by mouth every 6 (six) hours as needed. For pain      . Probiotic Product (PHILLIPS COLON HEALTH) CAPS Take 1 capsule by mouth daily as needed. For bowels      . VAGIFEM 10 MCG TABS Place 1 suppository vaginally 2 (two) times a week.        Review of Systems Review of Systems  Constitutional: Negative for fever, chills and unexpected weight change.  HENT: Negative for hearing loss, congestion, sore  throat, trouble swallowing and voice change.   Eyes: Negative for visual disturbance.  Respiratory: Negative for cough and wheezing.   Cardiovascular: Negative for chest pain, palpitations and leg swelling.  Gastrointestinal: Negative for nausea, vomiting, abdominal pain, diarrhea, constipation, blood in stool, abdominal distention and anal bleeding.  Genitourinary: Negative for hematuria, vaginal bleeding and difficulty urinating.  Musculoskeletal: Negative for arthralgias.  Skin: Negative for rash and wound.  Neurological: Negative for seizures, syncope and headaches.  Hematological: Negative for adenopathy. Does not bruise/bleed easily.  Psychiatric/Behavioral: Negative for confusion.    Blood pressure 104/64, pulse 70, temperature 98.1 F (36.7 C), temperature source Temporal, resp. rate 16, height 5' 6" (1.676 m), weight 141 lb 2 oz (64.014 kg).  Physical Exam Physical Exam  Vitals reviewed. Constitutional: She is oriented to person, place, and time. She appears well-developed and well-nourished. No distress.  HENT:  Head: Normocephalic and atraumatic.  Mouth/Throat: Oropharynx is clear and moist.  Eyes: Conjunctivae and EOM are normal. Pupils are equal, round, and reactive to light. No scleral icterus.  Neck: Normal range of motion. Neck supple. No tracheal deviation present. No   thyromegaly present.  Cardiovascular: Normal rate, regular rhythm, normal heart sounds and intact distal pulses.  Exam reveals no gallop and no friction rub.   No murmur heard. Pulmonary/Chest: Effort normal and breath sounds normal. No respiratory distress. She has no wheezes. She has no rales.  Abdominal: Soft. Bowel sounds are normal. She exhibits no distension and no mass. There is no tenderness. There is no rebound and no guarding.  Musculoskeletal: Normal range of motion. She exhibits no edema and no tenderness.  Neurological: She is alert and oriented to person, place, and time.  Skin: Skin is warm  and dry. No rash noted. She is not diaphoretic. No erythema.  Psychiatric: She has a normal mood and affect. Her behavior is normal. Judgment and thought content normal.    Data Reviewed I reviewed her radiology reports, notes in the electronic medical record,And endoscopy notes.  Assessment     Gallstones, probably symptomatic    Plan    I had a long discussion with the patient about options. I favor cholecystectomy as I think some of her symptoms are biliary related although currently she is asymptomatic. We discussed risks and complications and expectations. She would like to defer any surgery until November. Therefore am going to plan to see her back in October to review the situation with her again. If she has any episodes in the meantime she will call me and we will reevaluate the decision to defer surgery.       Saverio Kader J 09/04/2012, 8:58 AM    

## 2012-09-04 NOTE — Patient Instructions (Signed)
I will see you at the hospital for surgery on the 14th. Call the office if you have any questions before then

## 2012-09-08 ENCOUNTER — Encounter (HOSPITAL_COMMUNITY)
Admission: RE | Admit: 2012-09-08 | Discharge: 2012-09-08 | Disposition: A | Discharge status: Home / Self Care | Payer: BC Managed Care – PPO | Source: Ambulatory Visit | Attending: Surgery | Admitting: Surgery

## 2012-09-08 ENCOUNTER — Encounter (HOSPITAL_COMMUNITY): Payer: Self-pay

## 2012-09-08 HISTORY — DX: Malignant (primary) neoplasm, unspecified: C80.1

## 2012-09-08 LAB — CBC WITH DIFFERENTIAL/PLATELET
Basophils Absolute: 0 10*3/uL (ref 0.0–0.1)
Basophils Relative: 0 % (ref 0–1)
Eosinophils Relative: 3 % (ref 0–5)
HCT: 40.1 % (ref 36.0–46.0)
MCH: 30.8 pg (ref 26.0–34.0)
MCHC: 34.7 g/dL (ref 30.0–36.0)
MCV: 88.7 fL (ref 78.0–100.0)
Monocytes Absolute: 0.4 10*3/uL (ref 0.1–1.0)
RDW: 12.6 % (ref 11.5–15.5)

## 2012-09-08 LAB — COMPREHENSIVE METABOLIC PANEL
AST: 24 U/L (ref 0–37)
Albumin: 3.7 g/dL (ref 3.5–5.2)
BUN: 17 mg/dL (ref 6–23)
Calcium: 9.3 mg/dL (ref 8.4–10.5)
Creatinine, Ser: 0.86 mg/dL (ref 0.50–1.10)
GFR calc non Af Amer: 68 mL/min — ABNORMAL LOW (ref >90)

## 2012-09-08 LAB — URINALYSIS, ROUTINE W REFLEX MICROSCOPIC
Glucose, UA: NEGATIVE mg/dL
Nitrite: NEGATIVE
Protein, ur: NEGATIVE mg/dL
pH: 7 (ref 5.0–8.0)

## 2012-09-08 LAB — SURGICAL PCR SCREEN: MRSA, PCR: NEGATIVE

## 2012-09-08 LAB — LIPASE, BLOOD: Lipase: 36 U/L (ref 11–59)

## 2012-09-08 LAB — URINE MICROSCOPIC-ADD ON

## 2012-09-08 NOTE — Pre-Procedure Instructions (Signed)
19 Patricia Flynn  09/08/2012   Your procedure is scheduled on:  09/15/2012  Report to [facility] at 5:30 AM.  Call this number if you have problems the morning of surgery: [phone]   Remember:   Do not eat food or drink liquid:After Midnight.- MONDAY  :    Take these medicines the morning of surgery with A SIP OF WATER: NOTHING   Do not wear jewelry, make-up or nail polish.  Do not wear lotions, powders, or perfumes. You may wear deodorant.  Do not shave 48 hours prior to surgery. Men may shave face and neck.  Do not bring valuables to the hospital.  Contacts, dentures or bridgework may not be worn into surgery.  Leave suitcase in the car. After surgery it may be brought to your room.  For patients admitted to the hospital, checkout time is 11:00 AM the day of discharge.   Patients discharged the day of surgery will not be allowed to drive home.  Name and phone number of your driver: /w spouse  Special Instructions: Shower using CHG 2 nights before surgery and the night before surgery.  If you shower the day of surgery use CHG.  Use special wash - you have one bottle of CHG for all showers.  You should use approximately 1/3 of the bottle for each shower.   Please read over the following fact sheets that you were given: Pain Booklet, Coughing and Deep Breathing, MRSA Information and Surgical Site Infection Prevention

## 2012-09-14 MED ORDER — CEFAZOLIN SODIUM-DEXTROSE 2-3 GM-% IV SOLR
2.0000 g | INTRAVENOUS | Status: AC
  Administered 2012-09-15: 2 g via INTRAVENOUS
  Filled 2012-09-14: qty 50

## 2012-09-15 ENCOUNTER — Ambulatory Visit (HOSPITAL_COMMUNITY)
Admission: RE | Admit: 2012-09-15 | Discharge: 2012-09-15 | Disposition: A | Discharge status: Home / Self Care | Payer: BC Managed Care – PPO | Source: Ambulatory Visit | Attending: Surgery | Admitting: Surgery

## 2012-09-15 ENCOUNTER — Ambulatory Visit (HOSPITAL_COMMUNITY): Payer: BC Managed Care – PPO | Admitting: Anesthesiology

## 2012-09-15 ENCOUNTER — Encounter (HOSPITAL_COMMUNITY): Payer: Self-pay | Admitting: Anesthesiology

## 2012-09-15 ENCOUNTER — Ambulatory Visit (HOSPITAL_COMMUNITY): Payer: BC Managed Care – PPO

## 2012-09-15 ENCOUNTER — Encounter (HOSPITAL_COMMUNITY)
Admission: RE | Disposition: A | Discharge status: Home / Self Care | Payer: Self-pay | Source: Ambulatory Visit | Attending: Surgery

## 2012-09-15 ENCOUNTER — Encounter (HOSPITAL_COMMUNITY): Payer: Self-pay | Admitting: *Deleted

## 2012-09-15 DIAGNOSIS — Z8249 Family history of ischemic heart disease and other diseases of the circulatory system: Secondary | ICD-10-CM | POA: Insufficient documentation

## 2012-09-15 DIAGNOSIS — Z87891 Personal history of nicotine dependence: Secondary | ICD-10-CM | POA: Insufficient documentation

## 2012-09-15 DIAGNOSIS — Z853 Personal history of malignant neoplasm of breast: Secondary | ICD-10-CM | POA: Insufficient documentation

## 2012-09-15 DIAGNOSIS — I10 Essential (primary) hypertension: Secondary | ICD-10-CM | POA: Insufficient documentation

## 2012-09-15 DIAGNOSIS — Z803 Family history of malignant neoplasm of breast: Secondary | ICD-10-CM | POA: Insufficient documentation

## 2012-09-15 DIAGNOSIS — K8010 Calculus of gallbladder with chronic cholecystitis without obstruction: Secondary | ICD-10-CM

## 2012-09-15 DIAGNOSIS — Z9010 Acquired absence of unspecified breast and nipple: Secondary | ICD-10-CM | POA: Insufficient documentation

## 2012-09-15 HISTORY — PX: CHOLECYSTECTOMY: SHX55

## 2012-09-15 SURGERY — LAPAROSCOPIC CHOLECYSTECTOMY WITH INTRAOPERATIVE CHOLANGIOGRAM
Anesthesia: General | Site: Abdomen | Wound class: Clean Contaminated

## 2012-09-15 MED ORDER — ROCURONIUM BROMIDE 100 MG/10ML IV SOLN
INTRAVENOUS | Status: DC | PRN
  Administered 2012-09-15: 40 mg via INTRAVENOUS

## 2012-09-15 MED ORDER — FENTANYL CITRATE 0.05 MG/ML IJ SOLN
INTRAMUSCULAR | Status: DC | PRN
  Administered 2012-09-15 (×3): 50 ug via INTRAVENOUS

## 2012-09-15 MED ORDER — SODIUM CHLORIDE 0.9 % IV SOLN: 250.0000 mL | INTRAVENOUS | Status: DC | PRN

## 2012-09-15 MED ORDER — ARTIFICIAL TEARS OP OINT
TOPICAL_OINTMENT | OPHTHALMIC | Status: DC | PRN
  Administered 2012-09-15: 1 via OPHTHALMIC

## 2012-09-15 MED ORDER — LACTATED RINGERS IV SOLN
INTRAVENOUS | Status: DC | PRN
  Administered 2012-09-15 (×2): via INTRAVENOUS

## 2012-09-15 MED ORDER — CHLORHEXIDINE GLUCONATE 4 % EX LIQD: 1.0000 "application " | Freq: Once | CUTANEOUS | Status: DC

## 2012-09-15 MED ORDER — ACETAMINOPHEN 650 MG RE SUPP: 650.0000 mg | RECTAL | Status: DC | PRN

## 2012-09-15 MED ORDER — ACETAMINOPHEN 325 MG PO TABS: 650.0000 mg | ORAL_TABLET | ORAL | Status: DC | PRN

## 2012-09-15 MED ORDER — BUPIVACAINE HCL (PF) 0.25 % IJ SOLN
INTRAMUSCULAR | Status: AC
  Filled 2012-09-15: qty 30

## 2012-09-15 MED ORDER — GLYCOPYRROLATE 0.2 MG/ML IJ SOLN
INTRAMUSCULAR | Status: DC | PRN
  Administered 2012-09-15: 0.4 mg via INTRAVENOUS
  Administered 2012-09-15: 0.2 mg via INTRAVENOUS
  Administered 2012-09-15: 0.4 mg via INTRAVENOUS

## 2012-09-15 MED ORDER — MIDAZOLAM HCL 5 MG/5ML IJ SOLN
INTRAMUSCULAR | Status: DC | PRN
  Administered 2012-09-15 (×2): 1 mg via INTRAVENOUS

## 2012-09-15 MED ORDER — OXYCODONE HCL 5 MG PO TABS
5.0000 mg | ORAL_TABLET | ORAL | Status: DC | PRN
Start: 2012-09-15 — End: 2012-09-17

## 2012-09-15 MED ORDER — PROPOFOL 10 MG/ML IV BOLUS
INTRAVENOUS | Status: DC | PRN
  Administered 2012-09-15: 150 mg via INTRAVENOUS
  Administered 2012-09-15: 50 mg via INTRAVENOUS

## 2012-09-15 MED ORDER — OXYCODONE-ACETAMINOPHEN 5-325 MG PO TABS: 1.0000 | ORAL_TABLET | ORAL | Status: DC | PRN

## 2012-09-15 MED ORDER — EPHEDRINE SULFATE 50 MG/ML IJ SOLN
INTRAMUSCULAR | Status: DC | PRN
  Administered 2012-09-15: 20 mg via INTRAVENOUS
  Administered 2012-09-15: 30 mg via INTRAVENOUS

## 2012-09-15 MED ORDER — SODIUM CHLORIDE 0.9 % IV SOLN
INTRAVENOUS | Status: DC | PRN
  Administered 2012-09-15: 08:00:00

## 2012-09-15 MED ORDER — SODIUM CHLORIDE 0.9 % IJ SOLN: 3.0000 mL | Freq: Two times a day (BID) | INTRAMUSCULAR | Status: DC

## 2012-09-15 MED ORDER — BUPIVACAINE HCL (PF) 0.25 % IJ SOLN
INTRAMUSCULAR | Status: DC | PRN
  Administered 2012-09-15: 22 mL

## 2012-09-15 MED ORDER — ATROPINE SULFATE 1 MG/ML IJ SOLN
INTRAMUSCULAR | Status: DC | PRN
  Administered 2012-09-15: .5 mg via INTRAVENOUS

## 2012-09-15 MED ORDER — LIDOCAINE HCL (CARDIAC) 20 MG/ML IV SOLN
INTRAVENOUS | Status: DC | PRN
  Administered 2012-09-15: 80 mg via INTRAVENOUS

## 2012-09-15 MED ORDER — SODIUM CHLORIDE 0.9 % IJ SOLN: 3.0000 mL | INTRAMUSCULAR | Status: DC | PRN

## 2012-09-15 MED ORDER — ONDANSETRON HCL 4 MG/2ML IJ SOLN
INTRAMUSCULAR | Status: DC | PRN
  Administered 2012-09-15: 4 mg via INTRAVENOUS

## 2012-09-15 MED ORDER — NEOSTIGMINE METHYLSULFATE 1 MG/ML IJ SOLN
INTRAMUSCULAR | Status: DC | PRN
  Administered 2012-09-15: 4 mg via INTRAVENOUS

## 2012-09-15 MED ORDER — MORPHINE SULFATE 2 MG/ML IJ SOLN: 2.0000 mg | INTRAMUSCULAR | Status: DC | PRN

## 2012-09-15 MED ORDER — HYDROMORPHONE HCL PF 1 MG/ML IJ SOLN: 0.2500 mg | INTRAMUSCULAR | Status: DC | PRN

## 2012-09-15 MED ORDER — ONDANSETRON HCL 4 MG/2ML IJ SOLN: 4.0000 mg | Freq: Four times a day (QID) | INTRAMUSCULAR | Status: DC | PRN

## 2012-09-15 MED ORDER — SODIUM CHLORIDE 0.9 % IR SOLN
Status: DC | PRN
  Administered 2012-09-15: 1000 mL

## 2012-09-15 SURGICAL SUPPLY — 39 items
ADH SKN CLS APL DERMABOND .7 (GAUZE/BANDAGES/DRESSINGS) ×1
APPLIER CLIP ROT 10 11.4 M/L (STAPLE) ×2
APR CLP MED LRG 11.4X10 (STAPLE) ×1
BAG SPEC RTRVL LRG 6X4 10 (ENDOMECHANICALS) ×1
BLADE SURG ROTATE 9660 (MISCELLANEOUS) IMPLANT
CANISTER SUCTION 2500CC (MISCELLANEOUS) ×2 IMPLANT
CHLORAPREP W/TINT 26ML (MISCELLANEOUS) ×2 IMPLANT
CLIP APPLIE ROT 10 11.4 M/L (STAPLE) ×1 IMPLANT
CLOTH BEACON ORANGE TIMEOUT ST (SAFETY) ×2 IMPLANT
COVER MAYO STAND STRL (DRAPES) ×2 IMPLANT
COVER SURGICAL LIGHT HANDLE (MISCELLANEOUS) ×2 IMPLANT
DECANTER SPIKE VIAL GLASS SM (MISCELLANEOUS) ×2 IMPLANT
DERMABOND ADVANCED (GAUZE/BANDAGES/DRESSINGS) ×1
DERMABOND ADVANCED .7 DNX12 (GAUZE/BANDAGES/DRESSINGS) ×1 IMPLANT
DRAPE C-ARM 42X72 X-RAY (DRAPES) ×2 IMPLANT
DRAPE UTILITY 15X26 W/TAPE STR (DRAPE) ×4 IMPLANT
ELECT REM PT RETURN 9FT ADLT (ELECTROSURGICAL) ×2
ELECTRODE REM PT RTRN 9FT ADLT (ELECTROSURGICAL) ×1 IMPLANT
FILTER SMOKE EVAC LAPAROSHD (FILTER) IMPLANT
GLOVE EUDERMIC 7 POWDERFREE (GLOVE) ×2 IMPLANT
GOWN PREVENTION PLUS XLARGE (GOWN DISPOSABLE) ×2 IMPLANT
GOWN STRL NON-REIN LRG LVL3 (GOWN DISPOSABLE) ×6 IMPLANT
KIT BASIN OR (CUSTOM PROCEDURE TRAY) ×2 IMPLANT
KIT ROOM TURNOVER OR (KITS) ×2 IMPLANT
NS IRRIG 1000ML POUR BTL (IV SOLUTION) ×2 IMPLANT
PAD ARMBOARD 7.5X6 YLW CONV (MISCELLANEOUS) ×2 IMPLANT
POUCH SPECIMEN RETRIEVAL 10MM (ENDOMECHANICALS) ×2 IMPLANT
SCISSORS LAP 5X35 DISP (ENDOMECHANICALS) IMPLANT
SET CHOLANGIOGRAPH 5 50 .035 (SET/KITS/TRAYS/PACK) ×2 IMPLANT
SET IRRIG TUBING LAPAROSCOPIC (IRRIGATION / IRRIGATOR) ×2 IMPLANT
SLEEVE ENDOPATH XCEL 5M (ENDOMECHANICALS) ×2 IMPLANT
SPECIMEN JAR SMALL (MISCELLANEOUS) ×2 IMPLANT
SUT MNCRL AB 4-0 PS2 18 (SUTURE) ×2 IMPLANT
TOWEL OR 17X24 6PK STRL BLUE (TOWEL DISPOSABLE) ×2 IMPLANT
TOWEL OR 17X26 10 PK STRL BLUE (TOWEL DISPOSABLE) ×2 IMPLANT
TRAY LAPAROSCOPIC (CUSTOM PROCEDURE TRAY) ×2 IMPLANT
TROCAR XCEL BLUNT TIP 100MML (ENDOMECHANICALS) ×2 IMPLANT
TROCAR XCEL NON-BLD 11X100MML (ENDOMECHANICALS) ×2 IMPLANT
TROCAR XCEL NON-BLD 5MMX100MML (ENDOMECHANICALS) ×2 IMPLANT

## 2012-09-15 NOTE — Interval H&P Note (Signed)
History and Physical Interval Note:  09/15/2012 7:10 AM  Patricia Flynn  has presented today for surgery, with the diagnosis of gallstones  The various methods of treatment have been discussed with the patient and family. After consideration of risks, benefits and other options for treatment, the patient has consented to  Procedure(s) (LRB) with comments: LAPAROSCOPIC CHOLECYSTECTOMY WITH INTRAOPERATIVE CHOLANGIOGRAM (N/A) as a surgical intervention .  The patient's history has been reviewed, patient examined, no change in status, stable for surgery.  I have reviewed the patient's chart and labs.  Questions were answered to the patient's satisfaction.     [name]

## 2012-09-15 NOTE — Anesthesia Postprocedure Evaluation (Signed)
  Anesthesia Post-op Note  Patient: Patricia Flynn  Procedure(s) Performed: Procedure(s) (LRB) with comments: LAPAROSCOPIC CHOLECYSTECTOMY WITH INTRAOPERATIVE CHOLANGIOGRAM (N/A)  Patient Location: PACU  Anesthesia Type:General  Level of Consciousness: awake  Airway and Oxygen Therapy: Patient Spontanous Breathing  Post-op Pain: mild  Post-op Assessment: Post-op Vital signs reviewed  Post-op Vital Signs: Reviewed  Complications: No apparent anesthesia complications

## 2012-09-15 NOTE — H&P (View-Only) (Signed)
Patient ID: ICESIS RENN, female   DOB: 1946-02-09, 67 y.o.   MRN: [id number]  Chief Complaint  Patient presents with  . Pre-op Exam    Lap chole scheduled 09/15/12    HPI Patricia Flynn is a 67 y.o. female.  She comes todasy for preoperative evaluation, having decided to proceed to cholecystectomyShe has had no intercurrent illnesses.  HPI  Past Medical History  Diagnosis Date  . Diverticulosis of colon (without mention of hemorrhage)   . History of breast cancer 1995  . Hypertension 2012    newly diagnosed  . Elevated LFTs     Past Surgical History  Procedure Date  . Mastectomy 1995    left    Family History  Problem Relation Age of Onset  . Breast cancer Mother 5  . Heart disease Father     2 MIs   . Colon cancer Neg Hx   . Stomach cancer Neg Hx     Social History History  Substance Use Topics  . Smoking status: Former Smoker    Types: Cigarettes    Quit date: 09/02/1978  . Smokeless tobacco: Never Used  . Alcohol Use: 4.2 oz/week    7 Glasses of wine per week     Comment: 2  0.4 ounces glass red OR WHITE wine per day    No Known Allergies  Current Outpatient Prescriptions  Medication Sig Dispense Refill  . ALPRAZolam (XANAX) 0.5 MG tablet Take 0.5 mg by mouth at bedtime as needed. Take  1/2 (half) qhs prn      . BYSTOLIC 5 MG tablet Take 2.5 mg by mouth Daily.       . Cyanocobalamin (NASCOBAL) 500 MCG/0.1ML SOLN Place 0.1 mLs (500 mcg total) into the nose once a week. Spray in nasal weekly  1.3 mL  3  . ibuprofen (ADVIL,MOTRIN) 200 MG tablet Take 200 mg by mouth every 6 (six) hours as needed. For pain      . Probiotic Product (PHILLIPS COLON HEALTH) CAPS Take 1 capsule by mouth daily as needed. For bowels      . VAGIFEM 10 MCG TABS Place 1 suppository vaginally 2 (two) times a week.        Review of Systems Review of Systems  Constitutional: Negative for fever, chills and unexpected weight change.  HENT: Negative for hearing loss, congestion, sore  throat, trouble swallowing and voice change.   Eyes: Negative for visual disturbance.  Respiratory: Negative for cough and wheezing.   Cardiovascular: Negative for chest pain, palpitations and leg swelling.  Gastrointestinal: Negative for nausea, vomiting, abdominal pain, diarrhea, constipation, blood in stool, abdominal distention and anal bleeding.  Genitourinary: Negative for hematuria, vaginal bleeding and difficulty urinating.  Musculoskeletal: Negative for arthralgias.  Skin: Negative for rash and wound.  Neurological: Negative for seizures, syncope and headaches.  Hematological: Negative for adenopathy. Does not bruise/bleed easily.  Psychiatric/Behavioral: Negative for confusion.    Blood pressure 104/64, pulse 70, temperature 98.1 F (36.7 C), temperature source Temporal, resp. rate 16, height 5\' 6"  (1.676 m), weight 141 lb 2 oz (64.014 kg).  Physical Exam Physical Exam  Vitals reviewed. Constitutional: She is oriented to person, place, and time. She appears well-developed and well-nourished. No distress.  HENT:  Head: Normocephalic and atraumatic.  Mouth/Throat: Oropharynx is clear and moist.  Eyes: Conjunctivae and EOM are normal. Pupils are equal, round, and reactive to light. No scleral icterus.  Neck: Normal range of motion. Neck supple. No tracheal deviation present. No  thyromegaly present.  Cardiovascular: Normal rate, regular rhythm, normal heart sounds and intact distal pulses.  Exam reveals no gallop and no friction rub.   No murmur heard. Pulmonary/Chest: Effort normal and breath sounds normal. No respiratory distress. She has no wheezes. She has no rales.  Abdominal: Soft. Bowel sounds are normal. She exhibits no distension and no mass. There is no tenderness. There is no rebound and no guarding.  Musculoskeletal: Normal range of motion. She exhibits no edema and no tenderness.  Neurological: She is alert and oriented to person, place, and time.  Skin: Skin is warm  and dry. No rash noted. She is not diaphoretic. No erythema.  Psychiatric: She has a normal mood and affect. Her behavior is normal. Judgment and thought content normal.    Data Reviewed I reviewed her radiology reports, notes in the electronic medical record,And endoscopy notes.  Assessment     Gallstones, probably symptomatic    Plan    I had a long discussion with the patient about options. I favor cholecystectomy as I think some of her symptoms are biliary related although currently she is asymptomatic. We discussed risks and complications and expectations. She would like to defer any surgery until November. Therefore am going to plan to see her back in October to review the situation with her again. If she has any episodes in the meantime she will call me and we will reevaluate the decision to defer surgery.       Rehmat Murtagh J 09/04/2012, 8:58 AM

## 2012-09-15 NOTE — Preoperative (Signed)
Beta Blockers   Reason not to administer Beta Blockers:Not Applicable 

## 2012-09-15 NOTE — Anesthesia Preprocedure Evaluation (Addendum)
Anesthesia Evaluation  Patient identified by MRN, date of birth, ID band Patient awake    Reviewed: Allergy & Precautions, H&P , NPO status , Patient's Chart, lab work & pertinent test results  Airway Mallampati: II      Dental   Pulmonary neg pulmonary ROS,  breath sounds clear to auscultation        Cardiovascular hypertension, Rhythm:Regular Rate:Normal     Neuro/Psych    GI/Hepatic Neg liver ROS, History of gallstones.   Endo/Other  negative endocrine ROS  Renal/GU negative Renal ROS     Musculoskeletal   Abdominal   Peds  Hematology   Anesthesia Other Findings   Reproductive/Obstetrics                          Anesthesia Physical Anesthesia Plan  ASA: II  Anesthesia Plan: General   Post-op Pain Management:    Induction: Intravenous  Airway Management Planned: Oral ETT  Additional Equipment:   Intra-op Plan:   Post-operative Plan: Extubation in OR  Informed Consent: I have reviewed the patients History and Physical, chart, labs and discussed the procedure including the risks, benefits and alternatives for the proposed anesthesia with the patient or authorized representative who has indicated his/her understanding and acceptance.   Dental advisory given  Plan Discussed with: CRNA, Anesthesiologist and Surgeon  Anesthesia Plan Comments:        Anesthesia Quick Evaluation

## 2012-09-15 NOTE — Progress Notes (Signed)
Discharged home with husband; incisions unremarkable; no c/o; denies pain at present

## 2012-09-15 NOTE — Transfer of Care (Signed)
Immediate Anesthesia Transfer of Care Note  Patient: Patricia Flynn  Procedure(s) Performed: Procedure(s) (LRB) with comments: LAPAROSCOPIC CHOLECYSTECTOMY WITH INTRAOPERATIVE CHOLANGIOGRAM (N/A)  Patient Location: PACU  Anesthesia Type:General  Level of Consciousness: awake, alert , oriented and sedated  Airway & Oxygen Therapy: Patient Spontanous Breathing and Patient connected to nasal cannula oxygen  Post-op Assessment: Report given to PACU RN, Post -op Vital signs reviewed and stable and Patient moving all extremities  Post vital signs: Reviewed and stable  Complications: No apparent anesthesia complications

## 2012-09-15 NOTE — Anesthesia Procedure Notes (Signed)
Procedure Name: Intubation Date/Time: 09/15/2012 7:31 AM Performed by: Fransisca Kaufmann Pre-anesthesia Checklist: Patient identified, Emergency Drugs available, Suction available, Patient being monitored and Timeout performed Patient Re-evaluated:Patient Re-evaluated prior to inductionOxygen Delivery Method: Circle system utilized Preoxygenation: Pre-oxygenation with 100% oxygen Intubation Type: IV induction Ventilation: Mask ventilation without difficulty Laryngoscope Size: Miller and 2 Grade View: Grade I Tube type: Oral Number of attempts: 1 Airway Equipment and Method: Stylet Placement Confirmation: ETT inserted through vocal cords under direct vision,  positive ETCO2 and breath sounds checked- equal and bilateral Secured at: 22 cm Tube secured with: Tape Dental Injury: Teeth and Oropharynx as per pre-operative assessment

## 2012-09-15 NOTE — Op Note (Signed)
BREEAN NANNINI March 19, 1946 [id number] 08/05/2012  Preoperative diagnosis: chronic calculus cholecystitis  Postoperative diagnosis: same  Procedure: laparoscopic cholecystectomy with intraoperative cholangiogram  Surgeon: Currie Paris, MD, FACS  Assistant surgeon: Dr. Axel Filler   Anesthesia: General  Clinical History and Indications: This patient has known gallstones and comes in today for cholecystectomy.  Description of procedure: The patient was seen in the preoperative area. I reviewed the plans for the procedure with her as well as the risks and complications. She had no further questions and wished to proceed.  The patient was taken to the operating room. After satisfactory general endotracheal anesthesia had been obtained the abdomen was prepped and draped. A time out was done.  0.25% plain Marcaine was used at all incisions. I made an umbilical incision, identified the fascia and opened that, and entered the peritoneal cavity under direct vision. A 0 Vicryl pursestring suture was placed and the Hasson cannula was introduced under direct vision and secured with the pursestring. The abdomen was inflated to 15 cm.  The camera was placed and there were no gross abnormalities. The patient was then placed in reverse Trendelenburg and tilted to the left. A 10/11 trocar was placed in the epigastrium and two 5 mm trochars placed laterally all under direct vision.  The gallbladder was retracted over the liver. The peritoneum overlying the cystic duct was opened. The appear branch of the cystic artery was identified crossing in front of the cystic duct so it was dissected out clipped and divided. A segment of cystic duct was then isolated. I had a nice window behind it. It was clipped once at its junction with the gallbladder.  An intraoperative cholangiogram was then performed. A Cook catheter was introduced percutaneously and placed in the cystic duct. The cholangiogram showed  good filling of the common duct and hepatic radicals and free flow into the duodenum. No abnormalities were noted.  The catheter was removed and 3 clips placed on the stay side of the cystic duct. The duct was then divided.  The posterior duct of the artery was identified clipped and divided. The gallbladder was then removed from below to above the coagulation current of the cautery. It was then placed in a bag to be retrieved later.  The abdomen was irrigated and a check for hemostasis along the bed of the gallbladder made. Once everything appeared to be dry we were able to move the camera to the epigastric port and removed the gallbladder through the umbilical port.  The abdomen was reinsufflated and a final check for hemostasis made. There is no evidence of bleeding or bile leakage. The lateral ports were removed under direct vision and there was no bleeding. The umbilical site was closed with a pursestring, watching with the camera in the epigastric port. The abdomen was then deflated through the epigastric port and that was removed. Skin was closed with 4-0 Monocryl subcuticular and Dermabond.  The patient tolerated the procedure well. There were no operative complications. EBL was minimal. All counts were correct.  Currie Paris, MD, FACS 09/15/2012 8:37 AM

## 2012-09-15 NOTE — Progress Notes (Signed)
Report given to stephanie rn as caregiver 

## 2012-09-17 ENCOUNTER — Encounter (HOSPITAL_COMMUNITY): Payer: Self-pay | Admitting: Surgery

## 2012-09-18 ENCOUNTER — Encounter (HOSPITAL_COMMUNITY)
Admission: RE | Admit: 2012-09-18 | Discharge: 2012-09-18 | Disposition: A | Discharge status: Home / Self Care | Payer: BC Managed Care – PPO | Source: Ambulatory Visit | Attending: Surgery | Admitting: Surgery

## 2012-09-18 ENCOUNTER — Other Ambulatory Visit (INDEPENDENT_AMBULATORY_CARE_PROVIDER_SITE_OTHER): Payer: Self-pay | Admitting: Surgery

## 2012-09-18 ENCOUNTER — Telehealth (INDEPENDENT_AMBULATORY_CARE_PROVIDER_SITE_OTHER): Payer: Self-pay

## 2012-09-18 ENCOUNTER — Other Ambulatory Visit (INDEPENDENT_AMBULATORY_CARE_PROVIDER_SITE_OTHER): Payer: Self-pay

## 2012-09-18 ENCOUNTER — Encounter (INDEPENDENT_AMBULATORY_CARE_PROVIDER_SITE_OTHER): Payer: Self-pay | Admitting: Surgery

## 2012-09-18 ENCOUNTER — Ambulatory Visit (INDEPENDENT_AMBULATORY_CARE_PROVIDER_SITE_OTHER): Payer: BC Managed Care – PPO | Admitting: Surgery

## 2012-09-18 VITALS — BP 118/82 | HR 60 | Resp 14 | Ht 66.0 in | Wt 142.0 lb

## 2012-09-18 DIAGNOSIS — R52 Pain, unspecified: Secondary | ICD-10-CM | POA: Insufficient documentation

## 2012-09-18 DIAGNOSIS — R1011 Right upper quadrant pain: Secondary | ICD-10-CM | POA: Insufficient documentation

## 2012-09-18 DIAGNOSIS — Z09 Encounter for follow-up examination after completed treatment for conditions other than malignant neoplasm: Secondary | ICD-10-CM

## 2012-09-18 DIAGNOSIS — Z9089 Acquired absence of other organs: Secondary | ICD-10-CM | POA: Insufficient documentation

## 2012-09-18 LAB — CBC W/MCH & 3 PART DIFF
HCT: 41.3 % (ref 36.0–46.0)
Hemoglobin: 13.6 g/dL (ref 12.0–15.0)
Lymphocytes Relative: 33 % (ref 12–46)
Lymphs Abs: 1.3 10*3/uL (ref 0.7–4.0)
MCH: 29.8 pg (ref 26.0–34.0)
MCV: 90.4 fL (ref 78.0–100.0)
Monocytes Relative: 7 % (ref 3–12)
RBC: 4.56 MIL/uL (ref 3.87–5.11)

## 2012-09-18 LAB — COMPREHENSIVE METABOLIC PANEL
Alkaline Phosphatase: 74 U/L (ref 39–117)
BUN: 12 mg/dL (ref 6–23)
Creat: 0.8 mg/dL (ref 0.50–1.10)
Glucose, Bld: 72 mg/dL (ref 70–99)
Sodium: 137 mEq/L (ref 135–145)
Total Bilirubin: 1.6 mg/dL — ABNORMAL HIGH (ref 0.3–1.2)

## 2012-09-18 MED ORDER — TECHNETIUM TC 99M MEBROFENIN IV KIT
5.0000 | PACK | Freq: Once | INTRAVENOUS | Status: AC | PRN
  Administered 2012-09-18: 5 via INTRAVENOUS

## 2012-09-18 NOTE — Telephone Encounter (Signed)
Pt called stating ruq abd pain started last night and has not resolved. No fever. No nausea. Had normal BM this am. Voiding well without pain. Tolerating normal diet. Reviewed with Dr Jamey Ripa. Per Dr Jamey Ripa, Hida scan, cmet. Cbc ordered stat. Pt will go to [facility] lab now and then to [facility] xray dept for Hida scan at 10:45am. Pt advised once tests are reviewed by Dr Jamey Ripa he will make recommendation on f/u. He will come over before or after OR to see pt today if indicated by test results. Pt to stay npo until after tests. Pt aware and states she understands. Jade advised to watch for results and given copy of orders. Orders to [facility] for precert.

## 2012-09-18 NOTE — Progress Notes (Signed)
CC: RUQ pain after lap chole three days ago  AVW:UJWJ patient underwent elective lap chole three days ago and went home after surgery doing well. She has taken essentially no pain meds. Last night she developed some RUQ pain, more along the costal margin than elsewhere and it has been persistent. She was not having this yesterday. No other associated sx as nausea, diarrhea.had a normal BM early this am,  Exam: BP 118/82  Pulse 60  Resp 14  Ht 5\' 6"  (1.676 m)  Wt 142 lb (64.411 kg)  BMI 22.92 kg/m2 General: alert, NAD Abd: Flat, soft, tender along the costal margin right side. wounds all clean and healing OK. There is a trocar site at the costal margin that is the area that is most tender, but her pain has been along the lateral half of the costal margin.BS wnl  Labs: wbc4.0K, hgb 13.6, K 3.4, Bili 1.6   Xray: HB scan negative for leak - good flow into duodenum  Imp: I think her pain is related to the trocar and not likely a more significant post op complication.  Plan: She will take some Advil and continue to monitor her pain levels. She wil call if worse and call Monday to let me know how she is doing. I reviewed the lab/xray reports with her

## 2012-09-18 NOTE — Patient Instructions (Signed)
Keep your current post op appointment as scheduled and call me Monday to let me know how you are doing

## 2012-09-21 ENCOUNTER — Encounter (INDEPENDENT_AMBULATORY_CARE_PROVIDER_SITE_OTHER): Payer: Self-pay

## 2012-09-24 ENCOUNTER — Telehealth (INDEPENDENT_AMBULATORY_CARE_PROVIDER_SITE_OTHER): Payer: Self-pay

## 2012-09-24 NOTE — Telephone Encounter (Signed)
Patient calling into office to report she's feeling better since her last office visit on 09/18/12.  Patient denies having any fever, nausea or vomiting.  Patient will call our office if her symptoms return or she has any questions or concerns.

## 2012-09-29 ENCOUNTER — Ambulatory Visit (INDEPENDENT_AMBULATORY_CARE_PROVIDER_SITE_OTHER): Payer: BC Managed Care – PPO | Admitting: Surgery

## 2012-09-29 ENCOUNTER — Encounter (INDEPENDENT_AMBULATORY_CARE_PROVIDER_SITE_OTHER): Payer: BC Managed Care – PPO | Admitting: Surgery

## 2012-09-29 ENCOUNTER — Encounter (INDEPENDENT_AMBULATORY_CARE_PROVIDER_SITE_OTHER): Payer: Self-pay | Admitting: Surgery

## 2012-09-29 VITALS — BP 118/76 | HR 68 | Temp 97.0°F | Resp 18 | Ht 66.0 in | Wt 142.0 lb

## 2012-09-29 DIAGNOSIS — Z09 Encounter for follow-up examination after completed treatment for conditions other than malignant neoplasm: Secondary | ICD-10-CM

## 2012-09-29 NOTE — Patient Instructions (Signed)
We will see you again on an as needed basis. Please call the office at [phone] if you have any questions or concerns. Thank you for allowing us to take care of you.  

## 2012-09-29 NOTE — Progress Notes (Signed)
NAME: Patricia Flynn       DOB: Nov 23, 1945           DATE: 09/29/2012       AVW:[id number]   CC: Postop laparoscopic cholecystectomy  Impression:  The patient appears to be doing well, with improvement in her symptoms.  Plan:  She may resume full activity and regular diet. She  will followup with [place] on a p.r.n. basis. I did tell her that she may still have some foods that cause indigestion and ask her to call us if there are any questions, problems or concerns.  HPI:  This patient underwent a laparoscopic cholecystectomy with operative cholangiogram on 09/15/12. She is in for her secondpostoperative visit. She notes that her incisional pain has resolved. Her preoperative symptoms have improved. She is not having problems with nausea, vomiting, diarrhea, fevers, chills, or urinary symptoms. She is tolerating diet. She feels that she is progressing well and nearly back to normal. PE:  VS: BP 118/76  Pulse 68  Temp 97 F (36.1 C) (Temporal)  Resp 18  Ht 5\' 6"  (1.676 m)  Wt 142 lb (64.411 kg)  BMI 22.92 kg/m2  General: The patient is alert and appears comfortable, NAD.  Abdomen: Soft and benign. The incisions are healing nicely. There are no apparent problems.  Data reviewed: IOC:  History: Cholelithiasis.  Findings: Gallbladder is been removed, and cystic duct has been  cannulated. The visualized intrahepatic ducts appear normal. The  common hepatic and common bile ducts appear within normal limits.  There does appear to be some spasm of the sphincter of Oddi. No  mass or calculus is seen. There is apparent free flow of contrast  via the common bile duct into the duodenum.  Conclusion: No mass or calculus appreciated. Distal common bile  duct spasm is apparent.  Original Report Authenticated By: Bretta Bang, M.D.  Pathology:  Diagnosis Gallbladder - CHRONIC CHOLECYSTITIS AND CHOLELITHIASIS. - THERE IS NO EVIDENCE OF MALIGNANCY. Pecola Leisure MD Pathologist, Electronic  Signature (Case signed 09/16/2012)

## 2012-12-11 ENCOUNTER — Ambulatory Visit (INDEPENDENT_AMBULATORY_CARE_PROVIDER_SITE_OTHER): Payer: BC Managed Care – PPO | Admitting: Gynecology

## 2012-12-11 ENCOUNTER — Encounter: Payer: Self-pay | Admitting: Gynecology

## 2012-12-11 VITALS — BP 120/78 | HR 72 | Resp 16 | Ht 65.75 in | Wt 140.0 lb

## 2012-12-11 DIAGNOSIS — Z01419 Encounter for gynecological examination (general) (routine) without abnormal findings: Secondary | ICD-10-CM

## 2012-12-11 MED ORDER — ESTRADIOL 10 MCG VA TABS: 10.0000 ug | ORAL_TABLET | VAGINAL | Status: DC

## 2012-12-11 NOTE — Patient Instructions (Addendum)

## 2012-12-11 NOTE — Progress Notes (Signed)
67 y.o.  Married  Caucasian female   No obstetric history on file. here for annual exam.  Pt reports vaginal dryness despite vagifem twice weekly, uses [place].  Pt has all labs and results including DEXA and mammograms at PCP  No LMP recorded. Patient is postmenopausal.          Sexually active:Yes yes  The current method of family planning is Nonepost menopausal status.    Exercising:Yes  Last mammogram:  2013 was done at [facility] Last pap smear:2013 History of abnormal pap: n/a Smoking:no has in the past Alcohol:1-2 glasses of wine a night Last colonoscopy: [place] 2009 normal Last Bone Density: Dr. Waynard Edwards done in his office in 2012 or 2013  Last tetanus shot:2005 Last cholesterol check: Dr. Waynard Edwards done in his office 2013 BSE: yes  Hgb:                Urine:    Health Maintenance  Topic Date Due  . Tetanus/tdap  06/22/1964  . Mammogram  06/23/1995  . Colonoscopy  06/23/1995  . Pneumococcal Polysaccharide Vaccine Age 66 And Over  06/22/2010  . Influenza Vaccine  05/03/2013  . Zostavax  Completed    Family History  Problem Relation Age of Onset  . Breast cancer Mother 72  . Heart disease Father     2 MIs   . Colon cancer Neg Hx   . Stomach cancer Neg Hx     Patient Active Problem List  Diagnosis  . HYPERLIPIDEMIA  . BREAST CANCER, HX OF  . Chest pain  . Essential hypertension    Past Medical History  Diagnosis Date  . Diverticulosis of colon (without mention of hemorrhage)   . History of breast cancer 1995  . Elevated LFTs   . Cancer     Left breast  . Hypertension 2012    newly diagnosed    Past Surgical History  Procedure Laterality Date  . Mastectomy  1995    left, followed by reconstruction   . Cholecystectomy  09/15/2012    Procedure: LAPAROSCOPIC CHOLECYSTECTOMY WITH INTRAOPERATIVE CHOLANGIOGRAM;  Surgeon: Currie Paris, MD;  Location: [facility] OR;  Service: General;  Laterality: N/A;    Allergies: Review of patient's allergies indicates no known  allergies.  Current Outpatient Prescriptions  Medication Sig Dispense Refill  . ALPRAZolam (XANAX) 0.5 MG tablet Take 0.5 mg by mouth at bedtime as needed. Take  1/2 (half) qhs prn      . BYSTOLIC 5 MG tablet Take 2.5 mg by mouth at bedtime.       . Cyanocobalamin (NASCOBAL) 500 MCG/0.1ML SOLN Place 0.1 mLs into the nose once a week. Spray in nasal weekly- Wednesday  1.3 mL  3  . Probiotic Product (PHILLIPS COLON HEALTH) CAPS Take 1 capsule by mouth daily as needed. For bowels      . VAGIFEM 10 MCG TABS Place 1 suppository vaginally 2 (two) times a week.       No current facility-administered medications for this visit.    ROS: Pertinent items are noted in HPI.  Social Hx:    Exam:    BP 120/78  Pulse 72  Resp 16  Ht 5' 5.75" (1.67 m)  Wt 140 lb (63.504 kg)  BMI 22.77 kg/m2   Wt Readings from Last 3 Encounters:  12/11/12 140 lb (63.504 kg)  09/29/12 142 lb (64.411 kg)  09/18/12 142 lb (64.411 kg)     Ht Readings from Last 3 Encounters:  12/11/12 5' 5.75" (1.67  m)  09/29/12 5\' 6"  (1.676 m)  09/18/12 5\' 6"  (1.676 m)    General appearance: alert, cooperative and appears stated age Head: Normocephalic, without obvious abnormality, atraumatic Neck: no adenopathy, supple, symmetrical, trachea midline and thyroid not enlarged, symmetric, no tenderness/mass/nodules Lungs: clear to auscultation bilaterally Breasts: Left surgically absent with implant in place, right s/p reduction mammoplasty.  Mass in upper outer quadrant on right breast, approx 10 o'clock. No nipple retraction or dimpling, No nipple discharge or bleeding, No axillary or supraclavicular adenopathy.  Left lingular region examined-no mass  Heart: regular rate and rhythm Abdomen: soft, non-tender; bowel sounds normal; no masses,  no organomegaly, surgical sites noted Extremities: extremities normal, atraumatic, no cyanosis or edema Skin: Skin color, texture, turgor normal. No rashes or lesions Lymph nodes: Cervical,  supraclavicular, and axillary nodes normal. No abnormal inguinal nodes palpated Neurologic: Grossly normal   Pelvic: External genitalia:  no lesions, post-menopausal changes              Urethra:  normal appearing urethra with no masses, tenderness or lesions              Bartholins and Skenes: normal                 Vagina: pale, smooth, no rugae, no lesions              Cervix: normal appearance              Pap taken: no        Bimanual Exam:  Uterus:  uterus is atrophic and nontender                                      Adnexa: no masses                                      Rectovaginal: Confirms                                      Anus:  normal sphincter tone, no lesions  A: normal gyn exam Postmenopausal vaginal atrophy History of breast cancer with reconstruction-20y out Breast mass on right     P: mammogram annually Breast mass present, evaluated and unchanged since reduction surgery-last evaluated at [facility], PCP follows Refill vagifem, recommend EVOO or Cocoanut oil prn dyspareunia  Labs at PCP F/u prn  return annually or prn     An After Visit Summary was printed and given to the patient.

## 2013-12-27 ENCOUNTER — Ambulatory Visit (INDEPENDENT_AMBULATORY_CARE_PROVIDER_SITE_OTHER): Payer: 59 | Admitting: Obstetrics & Gynecology

## 2013-12-27 ENCOUNTER — Encounter: Payer: Self-pay | Admitting: Obstetrics & Gynecology

## 2013-12-27 VITALS — BP 108/64 | HR 78 | Ht 65.5 in | Wt 140.2 lb

## 2013-12-27 DIAGNOSIS — J069 Acute upper respiratory infection, unspecified: Secondary | ICD-10-CM

## 2013-12-27 DIAGNOSIS — Z0000 Encounter for general adult medical examination without abnormal findings: Secondary | ICD-10-CM

## 2013-12-27 DIAGNOSIS — Z01419 Encounter for gynecological examination (general) (routine) without abnormal findings: Secondary | ICD-10-CM

## 2013-12-27 DIAGNOSIS — Z124 Encounter for screening for malignant neoplasm of cervix: Secondary | ICD-10-CM

## 2013-12-27 LAB — POCT URINALYSIS DIPSTICK
BILIRUBIN UA: NEGATIVE
Blood, UA: NEGATIVE
GLUCOSE UA: NEGATIVE
Ketones, UA: NEGATIVE
Nitrite, UA: NEGATIVE
Protein, UA: NEGATIVE
UROBILINOGEN UA: NEGATIVE
pH, UA: 5

## 2013-12-27 MED ORDER — ESTRADIOL 10 MCG VA TABS: 10.0000 ug | ORAL_TABLET | VAGINAL | Status: DC

## 2013-12-27 MED ORDER — ALPRAZOLAM 0.5 MG PO TABS: 0.5000 mg | ORAL_TABLET | Freq: Every evening | ORAL | Status: DC | PRN

## 2013-12-27 MED ORDER — AMOXICILLIN-POT CLAVULANATE 875-125 MG PO TABS: 1.0000 | ORAL_TABLET | Freq: Two times a day (BID) | ORAL | Status: DC

## 2013-12-27 NOTE — Progress Notes (Signed)
68 y.o. No obstetric history on file. Married White F here for annual exam.  Reports several days of cough with sinus drainage that is greenish.  No fever but feeling really tired and achy.  Was given three days of azithromax and was starting to feel better.  Finished the antibiotic yesterday and now feeling worse again today.  Eyes watery as well.  No SOB.  No palpitations.  Major gyn concern is her vagina atrophic changes.  Really wants to use estrogen.  H/O breast cancer 1995.  Going to [facility] for f/u.  Never really sees oncologist, usually the PA.  I advised her I feel she needs to have a frank discussion with them about quality of life issues but that they need to guide the use of estrogen.  Pt never had chemo or radiation.  Would really like to have a sex life with spouse, if possible.    No LMP recorded. Patient is postmenopausal.          Sexually active: no  The current method of family planning is abstinence.    Exercising: yes  Gym Smoker:  no  Health Maintenance: Pap:  Due today History of abnormal Pap:  no MMG:  At [facility], reviewed in Care everywhere Colonoscopy:  2009, [facility]:   UTD at [place]:  UTD at [place]: does with PCP   reports that she quit smoking about 35 years ago. Her smoking use included Cigarettes. She smoked 0.00 packs per day. She has never used smokeless tobacco. She reports that she drinks about 4.2 - 6 ounces of alcohol per week. She reports that she does not use illicit drugs.  Past Medical History  Diagnosis Date  . Diverticulosis of colon (without mention of hemorrhage)   . History of breast cancer 1995  . Elevated LFTs   . Cancer     Left breast  . Hypertension 2012    newly diagnosed    Past Surgical History  Procedure Laterality Date  . Mastectomy  1995    left, followed by reconstruction   . Cholecystectomy  09/15/2012    Procedure: LAPAROSCOPIC CHOLECYSTECTOMY WITH INTRAOPERATIVE CHOLANGIOGRAM;   Surgeon: Haywood Lasso, MD;  Location: [facility];  Service: General;  Laterality: N/A;    Current Outpatient Prescriptions  Medication Sig Dispense Refill  . ALPRAZolam (XANAX) 0.5 MG tablet Take 1 tablet (0.5 mg total) by mouth at bedtime as needed. Take  1/2 (half) qhs prn  30 tablet  1  . Ascorbic Acid (VITAMIN C) 1000 MG tablet Take 1,000 mg by mouth daily.      Marland Kitchen BYSTOLIC 5 MG tablet Take 2.5 mg by mouth at bedtime.       . Cyanocobalamin (NASCOBAL) 500 MCG/0.1ML SOLN Place 0.1 mLs into the nose once a week. Spray in nasal weekly- Wednesday  1.3 mL  3  . Estradiol (VAGIFEM) 10 MCG TABS vaginal tablet Place 1 tablet (10 mcg total) vaginally 2 (two) times a week.  8 tablet  12  . Probiotic Product ([place]) CAPS Take 1 capsule by mouth daily as needed. For bowels      . amoxicillin-clavulanate (AUGMENTIN) 875-125 MG per tablet Take 1 tablet by mouth 2 (two) times daily. Take one tablet BID x 7D  14 tablet  0   No current facility-administered medications for this visit.    Family History  Problem Relation Age of Onset  . Breast cancer Mother 66  . Heart disease  Father     2 MIs   . Colon cancer Neg Hx   . Stomach cancer Neg Hx     ROS:  Pertinent items are noted in HPI.  Otherwise, a comprehensive ROS was negative.  Exam:   BP 108/64  Pulse 78  Ht 5' 5.5" (1.664 m)  Wt 140 lb 3.2 oz (63.594 kg)  BMI 22.97 kg/m2   Height: 5' 5.5" (166.4 cm)  Ht Readings from Last 3 Encounters:  12/27/13 5' 5.5" (1.664 m)  12/11/12 5' 5.75" (1.67 m)  09/29/12 5\' 6"  (1.676 m)    General appearance: alert, cooperative and appears stated age Head: Normocephalic, without obvious abnormality, atraumatic Neck: no adenopathy, supple, symmetrical, trachea midline and thyroid normal to inspection and palpation Lungs: clear to auscultation bilaterally Breasts: normal appearance, no masses or tenderness, well healed scars with implant Heart: regular rate and rhythm Abdomen: soft,  non-tender; bowel sounds normal; no masses,  no organomegaly Extremities: extremities normal, atraumatic, no cyanosis or edema Skin: Skin color, texture, turgor normal. No rashes or lesions Lymph nodes: Cervical, supraclavicular, and axillary nodes normal. No abnormal inguinal nodes palpated Neurologic: Grossly normal   Pelvic: External genitalia:  no lesions              Urethra:  normal appearing urethra with no masses, tenderness or lesions              Bartholins and Skenes: normal                 Vagina: normal appearing vagina with normal color and discharge, no lesions              Cervix: no lesions              Pap taken: yes Bimanual Exam:  Uterus:  normal size, contour, position, consistency, mobility, non-tender              Adnexa: normal adnexa               Rectovaginal: Confirms               Anus:  normal sphincter tone, no lesions  A:  Well Woman with normal exam PMP, no HRT H/O breast cancer s/p mastectomy 1995.  Still being followed at [facility] Dyspareunia due to atrophic changes Sinusitis/URI  P:   Mammogram yearly at [facility] pap smear today Have encouraged pt to discuss vaginal estrogen use with providers at [facility].  May need dilators as well.  I could help coordinate that if needed. Augmentin 875 bid x 10 days to pharmacy Robitussin AC 1 tsp three times daily as needed return annually or prn  An After Visit Summary was printed and given to the patient.

## 2013-12-28 ENCOUNTER — Telehealth: Payer: Self-pay | Admitting: Obstetrics & Gynecology

## 2013-12-28 MED ORDER — GUAIFENESIN-CODEINE 100-10 MG/5ML PO SYRP
5.0000 mL | ORAL_SOLUTION | Freq: Three times a day (TID) | ORAL | Status: DC | PRN
Start: 2013-12-28 — End: 2014-06-23

## 2013-12-28 NOTE — Telephone Encounter (Signed)
Inform robitussin AC sent to [facility].  1tsp up to three times daily as needed for cough.  Will make her sleepy as it contains codeine.  Can close encounter if no questions.

## 2013-12-28 NOTE — Telephone Encounter (Signed)
Dr.Miller, would you like me to send a prescription in for cough syrup to patient's pharmacy? I do not see this medication listed in office visit notes. Would be glad to send over prescription per your approval.

## 2013-12-28 NOTE — Telephone Encounter (Signed)
Patient says she was in yesterday and [name] Heck was supposed to send a cough syrup to her pharmacy with the other prescriptions. It was the only one not send can you please check this

## 2013-12-29 NOTE — Telephone Encounter (Signed)
Spoke with patient. Informed robitussin AC sent to pharmacy. 1tsp up to three times daily as needed for cough. Advised will make her sleepy because it contains codeine. Patient states "I will mainly be taking it at night so that is okay." Patient will call back with any further needs, questions, or concerns.  Routing to provider for final review. Patient agreeable to disposition. Will close encounter

## 2013-12-30 LAB — IPS PAP SMEAR ONLY

## 2014-01-04 DIAGNOSIS — N898 Other specified noninflammatory disorders of vagina: Secondary | ICD-10-CM | POA: Insufficient documentation

## 2014-01-11 ENCOUNTER — Telehealth: Payer: Self-pay | Admitting: Obstetrics & Gynecology

## 2014-01-11 NOTE — Telephone Encounter (Signed)
Spoke with patient. Patient states that she came in to see Dr.Miller on 4/27 and the use of vaginal estrogen was discussed. Patient spoke with providers at [facility] and they gave patient the okay to try vaginal estrogen. Patient is calling to let Dr.Miller know and to see what she recommends and what she could prescribe. Advised would send a message over to [place] with update and to see what she recommends at this time and give patient a call back. Patient is agreeable.  Patient still using pharmacy listed [facility].

## 2014-01-11 NOTE — Telephone Encounter (Signed)
Pt was told that it was ok for her to try the vaginal estrogen from her doctor at [facility]. She would like to speak with nurse about it.

## 2014-01-12 NOTE — Telephone Encounter (Signed)
Probably start with Premarin cream vaginally 1/2 gram twice weekly.

## 2014-01-13 NOTE — Telephone Encounter (Signed)
Left message to call Sundra Haddix at [phone]. 

## 2014-01-14 MED ORDER — ESTROGENS, CONJUGATED 0.625 MG/GM VA CREA: TOPICAL_CREAM | VAGINAL | Status: DC

## 2014-01-14 NOTE — Telephone Encounter (Signed)
Rx sent to pharmacy.  Left message for patient to use 1/2 gram vaginally twice weekly.  Recheck after 8 weeks.

## 2014-01-14 NOTE — Telephone Encounter (Signed)
Spoke with patient. Patient would like to start using Premarin at this time. Advised patient would send over prescription to pharmacy of choice. Patient is agreeable. Will use premarin cream 1/2 gram twice weekly and give [place] a call back with an update on how she is doing on Premarin. Patient would like to know if Dr.Miller would like her to be seen for a follow up appointment after using Premarin for a while. Advised would send a message to Dr.Miller and give patient a call back if she would like to see patient for follow up. Patient agreeable.  Dr.Miller, Premarin rx pending your approval. Would you like patient to come in for follow up visit?

## 2014-01-17 NOTE — Telephone Encounter (Signed)
Left message to call Fiorella Hanahan at [phone]. 

## 2014-01-17 NOTE — Telephone Encounter (Signed)
Left message to call [place] at [phone].  Needs 8 week recheck scheduled with Dr.Miller.

## 2014-01-17 NOTE — Telephone Encounter (Signed)
Left message to call Kaitlyn at [phone]. 

## 2014-01-17 NOTE — Telephone Encounter (Signed)
Pt returning call

## 2014-01-17 NOTE — Telephone Encounter (Signed)
Spoke with patient. Patient states that she would like to call back to schedule 8 week recheck with Dr.Miller as the time gets closer.   Routing to provider for final review. Patient agreeable to disposition. Will close encounter

## 2014-01-17 NOTE — Telephone Encounter (Signed)
Patient is returning a call to Kaitlyn. °

## 2014-03-26 IMAGING — CR DG CHEST 2V
2 series · 2 of 2 positions shown · non-contrast
Comparison: None.

CLINICAL DATA: Preoperative chest radiograph.  Gallbladder removal.

CHEST - 2 VIEW

[view not recorded (1 of 2)]
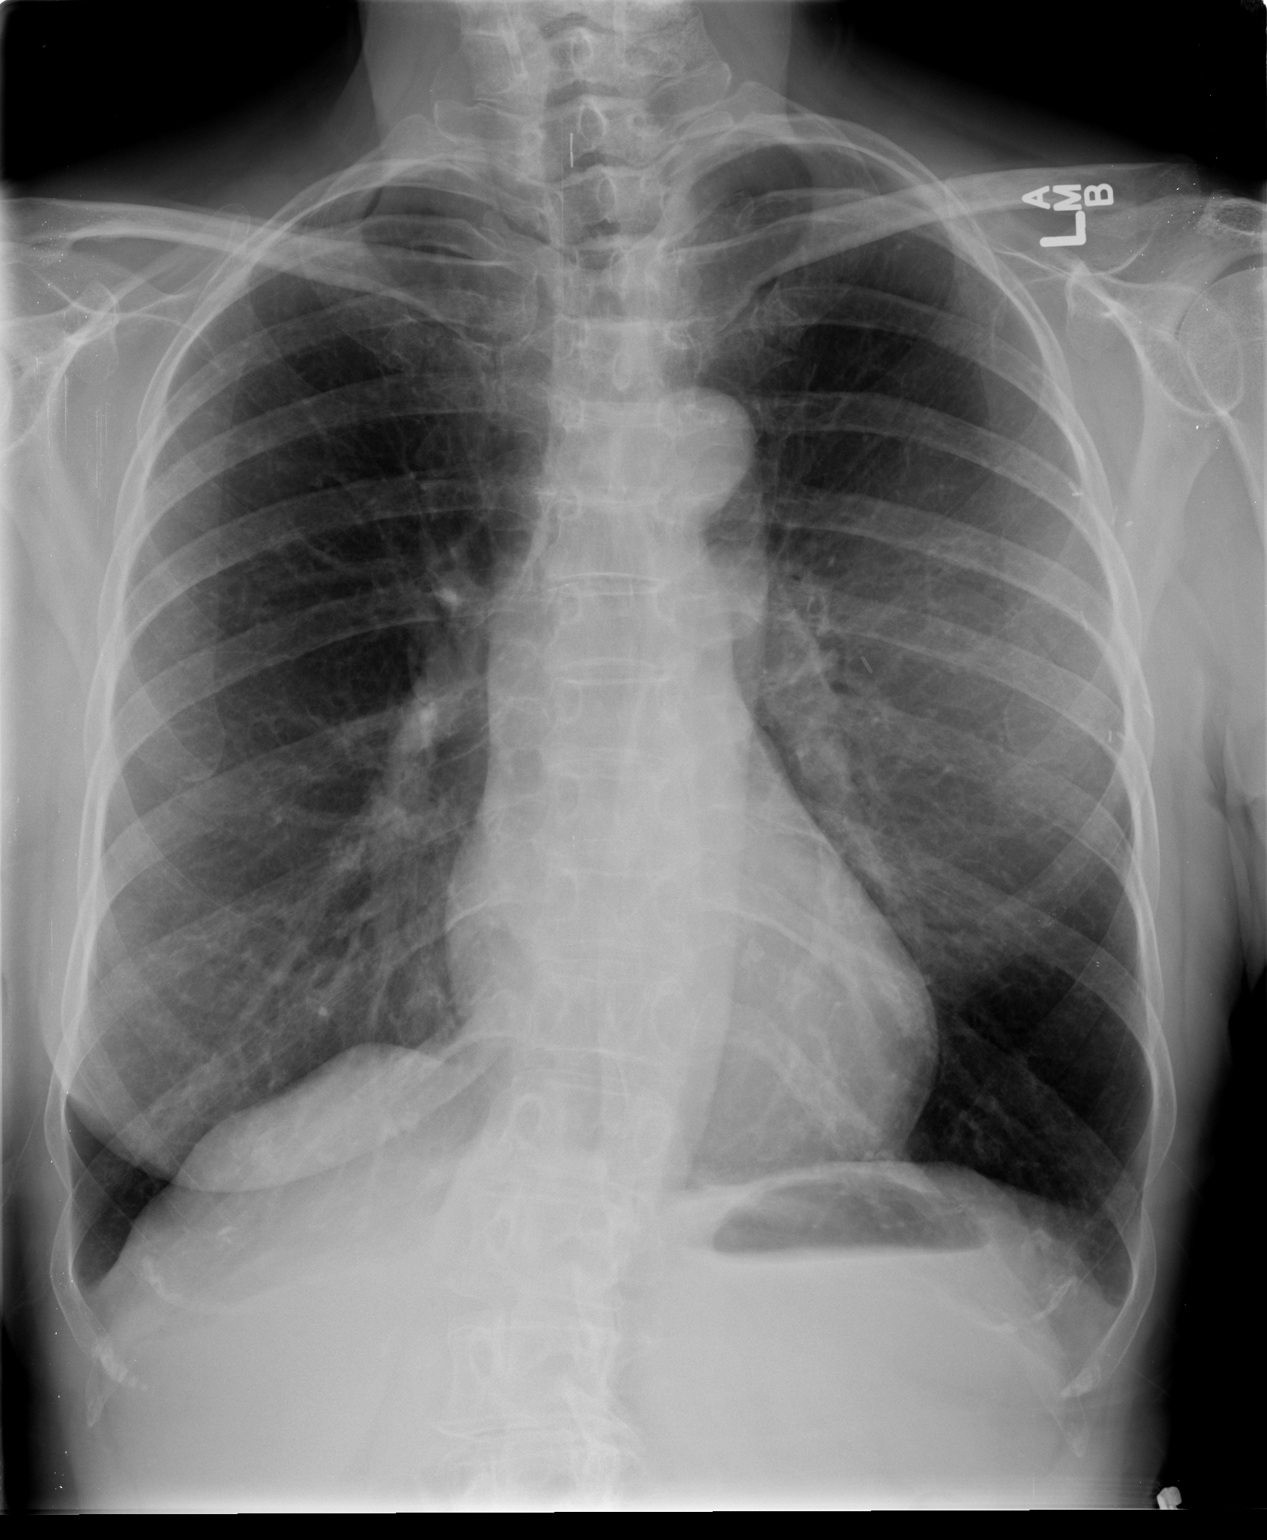

[view not recorded (2 of 2)]
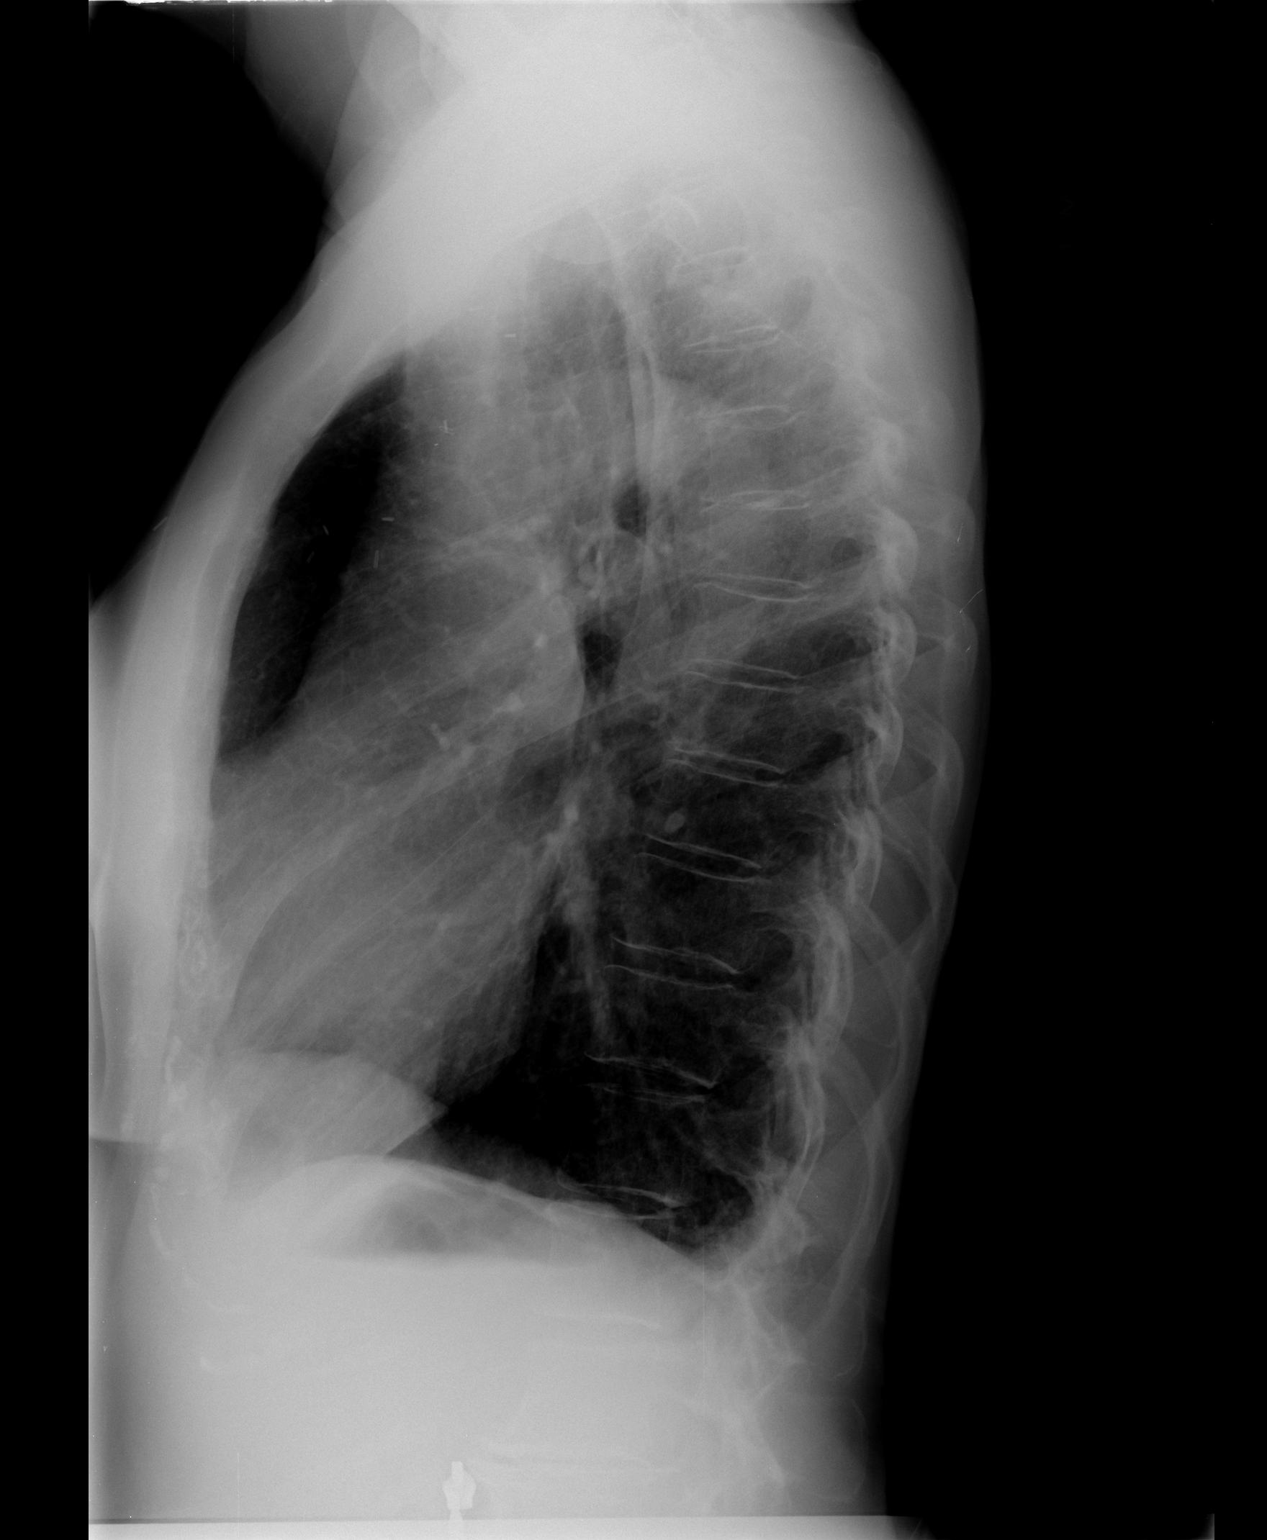

[2 of 2 positions shown; findings below may reference images not displayed]

FINDINGS: Left mastectomy.  Surgical clips extends along the left
chest towards the axilla.  Calcified granuloma projects over the
mid thoracic spine on the lateral view.  There is no airspace
disease.  Mild hyperinflation compatible with emphysema.  No
pleural effusion.  The cardiopericardial silhouette is within
normal limits.
IMPRESSION: No active cardiopulmonary disease.  Mild hyperinflation consistent
with emphysema.  Left mastectomy.

## 2014-06-23 ENCOUNTER — Ambulatory Visit (INDEPENDENT_AMBULATORY_CARE_PROVIDER_SITE_OTHER): Payer: 59 | Admitting: Obstetrics & Gynecology

## 2014-06-23 VITALS — BP 106/68 | HR 60 | Resp 16 | Wt 143.0 lb

## 2014-06-23 DIAGNOSIS — N898 Other specified noninflammatory disorders of vagina: Secondary | ICD-10-CM

## 2014-06-23 DIAGNOSIS — N952 Postmenopausal atrophic vaginitis: Secondary | ICD-10-CM

## 2014-06-23 NOTE — Progress Notes (Signed)
Subjective:     Patient ID: Patricia Flynn, female   DOB: 04-Jul-1946, 68 y.o.   MRN: [id number]  HPI 68 yo G3P3 here for follow up of vaginal atrophic changes.  Using premarin vaginal cream twice weekly.  Feels dryness is much, much better but hasn't tried to be sexually active.  Still very anxious about having pain.  Declines PT or need for therapy.  Just needs to attempt intercourse.  Pt knows if successful, can decrease to 1 time weekly use and see how this works for her.  No vaginal bleeding or discharge.   Review of Systems  All other systems reviewed and are negative.      Objective:   Physical Exam  Constitutional: She is oriented to person, place, and time. She appears well-developed and well-nourished.  Genitourinary: Vagina normal. There is no rash, tenderness, lesion or injury on the right labia. There is no rash, tenderness, lesion or injury on the left labia.  Vaginal tissue much improved.  More pink and improved moisture.  Medium Pederson speculum used without pain for pt.  Neurological: She is alert and oriented to person, place, and time.  Skin: Skin is warm and dry.  Psychiatric: She has a normal mood and affect.       Assessment:     Vaginal atrophic changes Dyspareunia     Plan:     Pt knows she just needs to attempt intercourse.  Lubricant recommendations made.  Questions answered.  Has rx for Premarin vaginal cream which she can use 1-2 times weekly.

## 2014-06-26 ENCOUNTER — Encounter: Payer: Self-pay | Admitting: Obstetrics & Gynecology

## 2014-06-26 DIAGNOSIS — N952 Postmenopausal atrophic vaginitis: Secondary | ICD-10-CM | POA: Insufficient documentation

## 2014-07-18 ENCOUNTER — Other Ambulatory Visit: Payer: Self-pay | Admitting: Dermatology

## 2014-11-07 ENCOUNTER — Telehealth: Payer: Self-pay | Admitting: *Deleted

## 2014-11-07 NOTE — Telephone Encounter (Signed)
Last AEX 12-27-13, scheduled for AEX 01-02-15. Return call to patient. States she has been in [place] since 10-06-14, thought she has beginning of UTI before she left but urine was negative at PCP. Symptoms increased and was seen my MD in [place] and treated for UTI. Thinks this has resolved but has not had follow-up testing. Insistent on appointment with Dr [name] Heck for next Tuesday to discuss several problems. Patient very hesitant about answering questions regarding symptoms.  States everything has changed with her GI system and she wants to start with Dr [name] Heck first. Discussed that GI system not really GYN issue and patient then states it is more related to change in sensation to void. Patient then asks if I am going to give her an appointment for Tuesday or not? She will call PCP if I dont want to help her. Advised that we are trying to assess her to determine the urgency of appointment. Due to surgery appointment not available on requested day, offered 11-11-14. patient declines as she is going out of town again. Can only come 3-15, 16 or 17. Appointment given 11-17-14 at 130, she is aware this is a work-in appointment. No actual assessment of patient symptoms obtained.    Routing to provider for final review.

## 2014-11-07 NOTE — Telephone Encounter (Signed)
Best [phone]  Patient calling from [place] wanting appt for next week with Dr. [name] Heck only. Wants to address a few problems and states it is really important to work her in next week.

## 2014-11-10 ENCOUNTER — Ambulatory Visit: Payer: Self-pay | Admitting: Obstetrics & Gynecology

## 2014-11-14 ENCOUNTER — Ambulatory Visit (INDEPENDENT_AMBULATORY_CARE_PROVIDER_SITE_OTHER): Payer: PRIVATE HEALTH INSURANCE | Admitting: Physician Assistant

## 2014-11-14 ENCOUNTER — Encounter: Payer: Self-pay | Admitting: Physician Assistant

## 2014-11-14 ENCOUNTER — Telehealth: Payer: Self-pay

## 2014-11-14 VITALS — BP 126/82 | HR 68 | Ht 66.0 in | Wt 139.4 lb

## 2014-11-14 DIAGNOSIS — K5730 Diverticulosis of large intestine without perforation or abscess without bleeding: Secondary | ICD-10-CM

## 2014-11-14 DIAGNOSIS — M25559 Pain in unspecified hip: Secondary | ICD-10-CM

## 2014-11-14 DIAGNOSIS — R938 Abnormal findings on diagnostic imaging of other specified body structures: Secondary | ICD-10-CM

## 2014-11-14 DIAGNOSIS — R9389 Abnormal findings on diagnostic imaging of other specified body structures: Secondary | ICD-10-CM

## 2014-11-14 MED ORDER — MOVIPREP 100 G PO SOLR: 1.0000 | ORAL | Status: DC

## 2014-11-14 NOTE — Progress Notes (Signed)
Agree with H&P as well as assessment and plans

## 2014-11-14 NOTE — Telephone Encounter (Signed)
Spoke with pt and scheduled her today at 3pm with Nicoletta Ba PA. Pt is aware of appt. Called Rodena Piety with [facility] and requested CT report.

## 2014-11-14 NOTE — Telephone Encounter (Signed)
-----   Message from Irene Shipper, MD sent at 11/13/2014  2:31 PM EDT ----- Regarding: needs to see AP Maylon Peppers by husband at home this weekend regarding [name]. She saw Dr Joylene Draft on Friday and had a CT scan (non [facility]) and was told that she "had a blockage and needed a colonoscopy". They are pleasant but anxious. Please have her see AP Monday if at all possible (if not, Tuesday) and get outside CT report. If she needs a colonoscopy, then set up with me in [place] on Thursday morning. Add on at 10:30 am. Thanks. You can reach her at [phone]. Dr. Henrene Pastor

## 2014-11-14 NOTE — Patient Instructions (Signed)
You have been scheduled for a colonoscopy. Please follow written instructions given to you at your visit today.  Please pick up your prep supplies at the pharmacy within the next 1-3 days. [place] If you use inhalers (even only as needed), please bring them with you on the day of your procedure. Your physician has requested that you go to www.startemmi.com and enter the access code given to you at your visit today. This web site gives a general overview about your procedure. However, you should still follow specific instructions given to you by our office regarding your preparation for the procedure.

## 2014-11-14 NOTE — Progress Notes (Signed)
Patient ID: Patricia Flynn, female   DOB: [birth year], 69 y.o.   MRN: [id number]   Subjective:    Patient ID: Patricia Flynn, female    DOB: 01-08-1946, 69 y.o.   MRN: [id number]  HPI Patricia Flynn is a 69 yo female referred today by Dr. Joylene Draft to Dr. Henrene Pastor for evaluation of an abnormal CT scan of the abdomen and pelvis. Patient had been seen here in 2013 by Dr. Sharlett Iles and had undergone EGD because of complaints of epigastric pain. She had some mild gastritis, biopsies were taken which were negative for Barrett's and also negative for H. pylori. Her last colonoscopy was done in 2009 and was pertinent for descending to sigmoid colon diverticulosis otherwise negative exam. Patient states that her current symptoms started a little over 5 weeks ago with some pelvic pressure or discomfort. She was checked for a urinary tract infection prior to leaving to go out of the country for several weeks and that was negative. She states about 3 or 4 days after she arrived in [place] she continued to have low pelvic pressure and some frequent urination and went to a physician there. Apparently was felt she did have a UTI and was started on a course of Cipro. She also had a pelvic ultrasound which she says was negative. She was given a sulfa antibiotic and took this and says her symptoms resolved. However week or so later symptoms recurred with pelvic pressure and she was given a course of Cipro and Pyridium which she completed and says her symptoms resolved again. She came back to the states last week to be evaluated was seen in Dr. Tempie Donning office. She had a UA done which showed 10-15 WBCs plus leukocytes and positive nitrate and culture is pending. She rarely also had a mildly elevated total bilirubin but has history of Joubert's. CT was done on 11 2016 through no vomiting with oral but no IV contrast and shows an area of luminal narrowing and wall thickening in the sigmoid colon over an area of about 3 cm there are no  inflammatory changes noted and no evidence of diverticular disease involving the sigmoid colon. Report red possible postinflammatory change versus neoplasm. She is obviously been quite anxious since learning of the abnormal CT. Her appetite has been fine her weight has been stable she continues with vague lower abdominal pressure-type sensation no melena or hematochezia and bowel movements have been normal once daily.  Review of Systems Pertinent positive and negative review of systems were noted in the above HPI section.  All other review of systems was otherwise negative.  Outpatient Encounter Prescriptions as of 11/14/2014  Medication Sig  . ALPRAZolam (XANAX) 0.5 MG tablet Take 1 tablet (0.5 mg total) by mouth at bedtime as needed. Take  1/2 (half) qhs prn  . Ascorbic Acid (VITAMIN C) 1000 MG tablet Take 1,000 mg by mouth daily.  Marland Kitchen BYSTOLIC 5 MG tablet Take 2.5 mg by mouth at bedtime.   . conjugated estrogens (PREMARIN) vaginal cream 1/2 gram vaginally twice weekly.  . Cyanocobalamin (NASCOBAL) 500 MCG/0.1ML SOLN Place 0.1 mLs into the nose once a week. Spray in nasal weekly- Wednesday  . Probiotic Product ([place]) CAPS Take 1 capsule by mouth daily as needed. For bowels  . MOVIPREP 100 G SOLR Take 1 kit (200 g total) by mouth as directed.   No Known Allergies Patient Active Problem List   Diagnosis Date Noted  . Dryness of vagina 01/04/2014  . Essential hypertension  12/13/2010  . Chest pain 11/30/2010  . HYPERLIPIDEMIA 06/23/2006  . BREAST CANCER, HX OF 06/23/2006   History   Social History  . Marital Status: Married    Spouse Name: N/A  . Number of Children: N/A  . Years of Education: N/A   Occupational History  . Not on file.   Social History Main Topics  . Smoking status: Former Smoker    Types: Cigarettes    Quit date: 09/02/1978  . Smokeless tobacco: Never Used  . Alcohol Use: 4.2 - 6.0 oz/week    7-10 Glasses of wine per week     Comment: 2  0.4  ounces glass red OR WHITE wine per day  . Drug Use: No  . Sexual Activity: Yes   Other Topics Concern  . Not on file   Social History Narrative    Ms. Galentine's family history includes Breast cancer (age of onset: 53) in her mother; Heart disease in her father. There is no history of Colon cancer or Stomach cancer.      Objective:    Filed Vitals:   11/14/14 1530  BP: 126/82  Pulse: 68    Physical Exam   well-developed older white female in no acute distress, anxious- blood pressure 126/82 pulse 68 height 5 foot 6 weight 139. HEENT ;nontraumatic normocephalic EOMI PERRLA sclera anicteric, Supple; no JVD, Cardiovascular ;regular rate and rhythm with S1-S2 no murmur or gallop, Pulmonary; clear bilaterally, Abdomen; soft nontender nondistended bowel sounds are active there is no palpable mass or hepatosplenomegaly, Rectal ;exam not done, Extremities; no clubbing cyanosis or edema skin warm and dry, Psych; mood and affect appropriate       Assessment & Plan:   #1 69 yo female with 5-6 week hx of pelvic pressure, questionable UTI, and abnormal CT scan with wall thickening and narrowing of sigmoid colon . R/o neoplasm,R/O possible recent diverticulitis with post inflammatory stricture (scan was done however with no IV contrast) #2 HTN #3 Hx of breast cancer  Plan; Pt is scheduled for Colonoscopy later this week 3/17 /2016  with Dr Henrene Pastor . Procedure discussed in detail  and she is agreeable to proceed. Pt had multiple questions most of which will require colonoscopy to answer accurately.[facility] Nurse Kindsey Eblin PA-C 11/14/2014   Cc: Crist Infante, MD

## 2014-11-15 ENCOUNTER — Telehealth: Payer: Self-pay | Admitting: Physician Assistant

## 2014-11-15 NOTE — Telephone Encounter (Signed)
Pt states she has some discomfort in the epigastric area from the contrast she had to drink for her MRI. Pt states she had diarrhea Friday after the MRI but does not have it now. Discussed with pt that she could try prilosec otc for the discomfort. Pt verbalized understanding.

## 2014-11-17 ENCOUNTER — Ambulatory Visit (AMBULATORY_SURGERY_CENTER): Payer: PRIVATE HEALTH INSURANCE | Admitting: Internal Medicine

## 2014-11-17 ENCOUNTER — Encounter: Payer: Self-pay | Admitting: Internal Medicine

## 2014-11-17 ENCOUNTER — Telehealth: Payer: Self-pay | Admitting: Internal Medicine

## 2014-11-17 ENCOUNTER — Ambulatory Visit: Payer: Self-pay | Admitting: Obstetrics & Gynecology

## 2014-11-17 VITALS — BP 115/76 | HR 59 | Temp 97.7°F | Resp 30 | Ht 66.0 in | Wt 139.0 lb

## 2014-11-17 DIAGNOSIS — R9389 Abnormal findings on diagnostic imaging of other specified body structures: Secondary | ICD-10-CM

## 2014-11-17 DIAGNOSIS — R933 Abnormal findings on diagnostic imaging of other parts of digestive tract: Secondary | ICD-10-CM

## 2014-11-17 MED ORDER — SODIUM CHLORIDE 0.9 % IV SOLN: 500.0000 mL | INTRAVENOUS | Status: DC

## 2014-11-17 NOTE — Patient Instructions (Signed)
YOU HAD AN ENDOSCOPIC PROCEDURE TODAY AT [facility]:   Refer to the procedure report that was given to you for any specific questions about what was found during the examination.  If the procedure report does not answer your questions, please call your gastroenterologist to clarify.  If you requested that your care partner not be given the details of your procedure findings, then the procedure report has been included in a sealed envelope for you to review at your convenience later.  YOU SHOULD EXPECT: Some feelings of bloating in the abdomen. Passage of more gas than usual.  Walking can help get rid of the air that was put into your GI tract during the procedure and reduce the bloating. If you had a lower endoscopy (such as a colonoscopy or flexible sigmoidoscopy) you may notice spotting of blood in your stool or on the toilet paper. If you underwent a bowel prep for your procedure, you may not have a normal bowel movement for a few days.  Please Note:  You might notice some irritation and congestion in your nose or some drainage.  This is from the oxygen used during your procedure.  There is no need for concern and it should clear up in a day or so.  SYMPTOMS TO REPORT IMMEDIATELY:   Following lower endoscopy (colonoscopy or flexible sigmoidoscopy):  Excessive amounts of blood in the stool  Significant tenderness or worsening of abdominal pains  Swelling of the abdomen that is new, acute  Fever of 100F or higher  For urgent or emergent issues, a gastroenterologist can be reached at any hour by calling [phone].   DIET: Your first meal following the procedure should be a small meal and then it is ok to progress to your normal diet. Heavy or fried foods are harder to digest and may make you feel nauseous or bloated.  Likewise, meals heavy in dairy and vegetables can increase bloating.  Drink plenty of fluids but you should avoid alcoholic beverages for 24  hours.  ACTIVITY:  You should plan to take it easy for the rest of today and you should NOT DRIVE or use heavy machinery until tomorrow (because of the sedation medicines used during the test).    FOLLOW UP: Our staff will call the number listed on your records the next business day following your procedure to check on you and address any questions or concerns that you may have regarding the information given to you following your procedure. If we do not reach you, we will leave a message.  However, if you are feeling well and you are not experiencing any problems, there is no need to return our call.  We will assume that you have returned to your regular daily activities without incident.  If any biopsies were taken you will be contacted by phone or by letter within the next 1-3 weeks.  Please call us at [phone] if you have not heard about the biopsies in 3 weeks.    SIGNATURES/CONFIDENTIALITY: You and/or your care partner have signed paperwork which will be entered into your electronic medical record.  These signatures attest to the fact that that the information above on your After Visit Summary has been reviewed and is understood.  Full responsibility of the confidentiality of this discharge information lies with you and/or your care-partner.  Recommendations Discharge instructions given to patient and/or care partner.

## 2014-11-17 NOTE — Telephone Encounter (Signed)
She generally does not have diverticulitis. In terms of her chronic GI complaints, it is best that she make a routine office visit and we can evaluate more thoroughly. Thanks

## 2014-11-17 NOTE — Telephone Encounter (Signed)
Pt gave permission to leave message on home phone of [phone]. Message left for pt regarding Dr. Blanch Media suggestions.

## 2014-11-17 NOTE — Op Note (Signed)
[facility]  [address]. [place], [address]   COLONOSCOPY PROCEDURE REPORT  PATIENT: Patricia Flynn, Patricia Flynn  MR#: [id number] BIRTHDATE: [birth year] , 75  yrs. old GENDER: female ENDOSCOPIST: Eustace Quail, MD REFERRED TM:[id number] Perini, M.D. PROCEDURE DATE:  11/17/2014 PROCEDURE:   Colonoscopy, diagnostic First Screening Colonoscopy - Avg.  risk and is 50 yrs.  old or older - No.  Prior Negative Screening - Now for repeat screening. N/A  History of Adenoma - Now for follow-up colonoscopy & has been > or = to 3 yrs.  N/A ASA CLASS:   Class I INDICATIONS:Evaluation of barium enema or other imaging study of an abnormality that is likely to be clinically significant, such as filling defect or stricture and Patient is not applicable for Colorectal Neoplasm Risk Assessment for this procedure.. Radiographic exam was CT scan suggesting left colon thickening asymmetrically. Patient had colonoscopy January 2009 which was negative. MEDICATIONS: Monitored anesthesia care and Propofol 250 mg IV  DESCRIPTION OF PROCEDURE:   After the risks benefits and alternatives of the procedure were thoroughly explained, informed consent was obtained.  The digital rectal exam revealed no abnormalities of the rectum.   The LB [id number]  endoscope was introduced through the anus and advanced to the cecum, which was identified by both the appendix and ileocecal valve. No adverse events experienced.   The quality of the prep was excellent. (MoviPrep was used)  The instrument was then slowly withdrawn as the colon was fully examined.     COLON FINDINGS: A normal appearing cecum, ileocecal valve, and appendiceal orifice were identified.  The ascending, transverse, descending, sigmoid colon, and rectum appeared unremarkable. Retroflexed views revealed no abnormalities. The time to cecum = 4.0 Withdrawal time = 10.0   The scope was withdrawn and the procedure completed.  COMPLICATIONS:  There were no immediate complications.  ENDOSCOPIC IMPRESSION: 1. Normal colonoscopy  RECOMMENDATIONS: 1. Return to the care of your primary provider.  GI follow up as needed  eSigned:  Eustace Quail, MD 11/17/2014 11:07 AM   cc: Crist Infante, MD and The Patient

## 2014-11-17 NOTE — Telephone Encounter (Signed)
Pt had colon today and had some questions for Dr. Henrene Pastor. Pt wanted to let Dr. Henrene Pastor know that some days she goes along and has no GI issues. States that something triggers her system and she will have lots of gas and her stomach just churns. States she will have acute gas pains and will have to lie down for 95min and then it gets better. Pt also states that at times she has a little ache in her lower abdomen and she wanted to know if she may have diverticulitis. States this comes and goes. Pt wanted to know if Dr. Henrene Pastor has any suggestions of what she can do for these symptoms and what she might be able to do to keep them from happening. Please advise.

## 2014-11-17 NOTE — Telephone Encounter (Signed)
Pt called back and does want to schedule an appt with Dr. Henrene Pastor but wants to be seen before 1st available which is April 21st. Do you want to work her in sooner? Please advise.

## 2014-11-18 ENCOUNTER — Telehealth: Payer: Self-pay | Admitting: *Deleted

## 2014-11-18 NOTE — Telephone Encounter (Signed)
Pt cannot come April 21st. Pt scheduled to see Dr. Henrene Pastor 12/27/14@10 :45am. Pt aware.

## 2014-11-18 NOTE — Telephone Encounter (Signed)
Let her know that If anyone cancels, she be offered the spot first.

## 2014-11-18 NOTE — Telephone Encounter (Signed)
  Follow up Call-  Call back number 11/17/2014  Post procedure Call Back phone  # [phone]  Permission to leave phone message Yes     Patient questions:  Do you have a fever, pain , or abdominal swelling? No. Pain Score  0 *  Have you tolerated food without any problems? Yes.    Have you been able to return to your normal activities? Yes.    Do you have any questions about your discharge instructions: Diet   No. Medications  No. Follow up visit  No.  Do you have questions or concerns about your Care? No.  Actions: * If pain score is 4 or above: No action needed, pain <4.

## 2014-11-18 NOTE — Telephone Encounter (Incomplete)
Let her know, If anyone cancels, she will be my first work in.

## 2014-12-07 ENCOUNTER — Telehealth: Payer: Self-pay | Admitting: Obstetrics & Gynecology

## 2014-12-07 NOTE — Telephone Encounter (Signed)
Spoke with patient. Patient states that she went out of the country and was seen with a gynecologist and treated for a UTI. Upon return to [place] saw Dr.Perini who recommended CT scan which patient states came back normal minus a are in her colon. Patient was seen with GI and had colonoscopy which she states was normal. Was then seen with urology with no abnormal findings. "I am still having the lower pelvic pain and I do not know where it could be coming from. I am worried it could be from my uterus. I do not know what to do but I know it is not normal." Denies any fever, back pain, chills, urinary symptoms, or bleeding. Offered patient appointment on Friday 4/8 with Dr.Miller but patient declines due to being out of town for work. Patient is requesting to see Dr.Miller today or tomorrow. Advised patient Dr.Miller is out of the office today. "Can she please see me tomorrow? I will rearrange my schedule." Advised patient will need to speak with Dr.Miller regarding scheduling and return call. Patient is agreeable.

## 2014-12-07 NOTE — Telephone Encounter (Signed)
Patient calling to check on the status of the call.

## 2014-12-07 NOTE — Telephone Encounter (Signed)
Spoke with patient. Advised patient I can get her scheduled with NP tomorrow for evaluation and Dr.Miller will be in the office if needed. Or can schedule with Dr.Miller for Friday. Patient requesting appointment tomorrow morning with NP. Appointment scheduled for tomorrow at 11:15am with Patricia Flynn, [place]. Patient is agreeable to date and time.  Routing to provider for final review. Patient agreeable to disposition. Will close encounter

## 2014-12-07 NOTE — Telephone Encounter (Signed)
Patient calling requesting to see Dr. [name] Heck for "discomfort" in the pelvic area she has "not been able to pinpoint the cause of." She said she has seen several different doctors for this already.

## 2014-12-08 ENCOUNTER — Telehealth: Payer: Self-pay | Admitting: Nurse Practitioner

## 2014-12-08 ENCOUNTER — Ambulatory Visit (INDEPENDENT_AMBULATORY_CARE_PROVIDER_SITE_OTHER): Payer: PRIVATE HEALTH INSURANCE | Admitting: Nurse Practitioner

## 2014-12-08 ENCOUNTER — Encounter: Payer: Self-pay | Admitting: Nurse Practitioner

## 2014-12-08 VITALS — BP 126/98 | HR 72 | Temp 97.3°F | Wt 138.8 lb

## 2014-12-08 DIAGNOSIS — R109 Unspecified abdominal pain: Secondary | ICD-10-CM | POA: Diagnosis not present

## 2014-12-08 LAB — CBC WITH DIFFERENTIAL/PLATELET
BASOS ABS: 0 10*3/uL (ref 0.0–0.1)
Basophils Relative: 0 % (ref 0–1)
Eosinophils Absolute: 0.1 10*3/uL (ref 0.0–0.7)
Eosinophils Relative: 2 % (ref 0–5)
HCT: 44.9 % (ref 36.0–46.0)
Hemoglobin: 14.7 g/dL (ref 12.0–15.0)
LYMPHS PCT: 36 % (ref 12–46)
Lymphs Abs: 1.4 10*3/uL (ref 0.7–4.0)
MCH: 30.2 pg (ref 26.0–34.0)
MCHC: 32.7 g/dL (ref 30.0–36.0)
MCV: 92.2 fL (ref 78.0–100.0)
MPV: 10.3 fL (ref 8.6–12.4)
Monocytes Absolute: 0.3 10*3/uL (ref 0.1–1.0)
Monocytes Relative: 7 % (ref 3–12)
NEUTROS ABS: 2.2 10*3/uL (ref 1.7–7.7)
Neutrophils Relative %: 55 % (ref 43–77)
PLATELETS: 184 10*3/uL (ref 150–400)
RBC: 4.87 MIL/uL (ref 3.87–5.11)
RDW: 13.5 % (ref 11.5–15.5)
WBC: 4 10*3/uL (ref 4.0–10.5)

## 2014-12-08 NOTE — Patient Instructions (Signed)
Will recheck in 2 weeks and follow with labs

## 2014-12-08 NOTE — Telephone Encounter (Signed)
Patient was seen today and forgot to ask about Vagifem. Patient is no sure if she was supposed to continue.

## 2014-12-08 NOTE — Progress Notes (Signed)
Subjective:     Patient ID: Patricia Flynn, female   DOB: [birth year], 69 y.o.   MRN: [id number]  HPI   This 69 yo W M Fe 1 month ago travel for a month ago in [place] and had a lower abdomina pressure.  She thought she had UTI and was treated with Sulfa. She then felt better until 5 days later. Same symptoms.  She took Cipro which she had on hand and still not better. Went back for health care in [place] and seen by a urologist who did bladder US and evaluation which was normal.  She continued to have pelvic pain and saw GYN in [place] which PUS also normal.  About this same time had increase in bowel sounds and 'grumbling' not related to appetite and not relieved with eating. She continues to have lower pelvic pressure. She had normal Bowel function during this time.  No N/V, fever/ chills, diarrhea, constipation.    Upon arrival to [facility] she saw Dr. Esau Grew who did a CT scan which was normal, then referred her to Urologist again - urine culture and eval was negative.  She also saw Dr. Henrene Pastor and had normal colonoscopy.  She continued to have symptoms despite nothing being found.  Last Thursday with a flare that she now thinks is related to red wine. Again yesterday had pain not related to food, wine, exercise, or bowel/bladder changes.  Review of Systems  Constitutional: Negative for fever, chills, diaphoresis, appetite change, fatigue and unexpected weight change.  Respiratory: Negative.   Cardiovascular: Negative.   Gastrointestinal: Positive for abdominal distention. Negative for nausea, vomiting, abdominal pain, diarrhea, constipation, blood in stool, anal bleeding and rectal pain.       Abdominal pressure more than pain, bloating was only on 1 episode.  Genitourinary: Positive for pelvic pain.  Musculoskeletal: Positive for myalgias.  Skin: Negative.   Neurological:       Twinges of muscle or nerve 'spasm'.  Hematological: Negative.   Psychiatric/Behavioral: The patient is nervous/anxious.         Objective:   Physical Exam  Constitutional: She is oriented to person, place, and time. She appears well-developed and well-nourished. She appears distressed.  Emotionally distressed and tearful at times.  Cardiovascular: Normal rate.   Pulmonary/Chest: Effort normal.  Abdominal: Soft. She exhibits no distension and no mass. There is tenderness. There is no rebound and no guarding.  Hyperactive BS in all quadrants  Genitourinary:  Normal atrophic vaginal changes.  No mass adnexa and unable to reproduce same pain or pressure.  Rectal exam is negative without mass.  Neurological: She is alert and oriented to person, place, and time.  Psychiatric: She has a normal mood and affect. Her behavior is normal. Judgment and thought content normal.  As noted anxiuos  pt. Is also seen and examined by Dr. [name] Heck Urine chemstrip today is negtive    Assessment:     Lower pelvic 'pressure' > 1 month with negative Uro, CT scan, Colonoscopy, PUS studies Possible... partially treated colitis that has taken longer to subside    Plan:     Dr. [name] Heck wishes to see pt. Back in 2 weeks Will obtain labs and follow Pt starting probiotics as well

## 2014-12-08 NOTE — Telephone Encounter (Signed)
Routing to Patricia Rolen-Grubb, FNP for review and advise. 

## 2014-12-09 LAB — MAGNESIUM: Magnesium: 1.9 mg/dL (ref 1.5–2.5)

## 2014-12-09 LAB — COMPREHENSIVE METABOLIC PANEL
ALBUMIN: 4.1 g/dL (ref 3.5–5.2)
ALK PHOS: 77 U/L (ref 39–117)
ALT: 37 U/L — ABNORMAL HIGH (ref 0–35)
AST: 35 U/L (ref 0–37)
BILIRUBIN TOTAL: 2.3 mg/dL — AB (ref 0.2–1.2)
BUN: 15 mg/dL (ref 6–23)
CALCIUM: 9.1 mg/dL (ref 8.4–10.5)
CHLORIDE: 106 meq/L (ref 96–112)
CO2: 24 meq/L (ref 19–32)
CREATININE: 0.74 mg/dL (ref 0.50–1.10)
Glucose, Bld: 89 mg/dL (ref 70–99)
POTASSIUM: 4.5 meq/L (ref 3.5–5.3)
Sodium: 140 mEq/L (ref 135–145)
Total Protein: 6.4 g/dL (ref 6.0–8.3)

## 2014-12-09 LAB — VITAMIN B12: Vitamin B-12: >2000 pg/mL — ABNORMAL HIGH (ref 211–911)

## 2014-12-09 LAB — VITAMIN D 25 HYDROXY (VIT D DEFICIENCY, FRACTURES): Vit D, 25-Hydroxy: 28 ng/mL — ABNORMAL LOW (ref 30–100)

## 2014-12-09 NOTE — Telephone Encounter (Signed)
Need to hold for [facility]

## 2014-12-12 ENCOUNTER — Telehealth: Payer: Self-pay

## 2014-12-12 ENCOUNTER — Telehealth: Payer: Self-pay | Admitting: Internal Medicine

## 2014-12-12 NOTE — Telephone Encounter (Signed)
Spoke with patient. Advised of message as seen below from Patricia Flynn, [place]. Patient is agreeable and verbalizes understanding. Patient is requesting refill on medication at this time.  Routing to [employer], FNP for orders to pharmacy on file.

## 2014-12-12 NOTE — Telephone Encounter (Signed)
Routing to [employer], FNP for review.

## 2014-12-12 NOTE — Telephone Encounter (Signed)
Good question - if she wants to continue with Vagifem it should be OK as we feel the symptoms are GI related.  But IF she uses the med's and has any increase in pain then stop.  When she comes back to see Dr. [name] Heck in 2 weeks they can discuss it further.

## 2014-12-12 NOTE — Telephone Encounter (Signed)
Pt states she has continued to have problems and would like to have her appt moved up. Pt scheduled to see Dr. Henrene Pastor tomorrow morning at 8:30am. Pt aware of appt.

## 2014-12-12 NOTE — Telephone Encounter (Signed)
Left message to call Kaitlyn at [phone]. 

## 2014-12-12 NOTE — Telephone Encounter (Signed)
-----   Message from Megan Salon, MD sent at 12/09/2014  5:13 PM EDT ----- Left detailed message with pt regarding labs.  Vit D mildly low.  Magnesium normal.  CBC normal.  CMP with elevated bilirubin which has been elevated for several years.  Has recently seen a GI.  I wanted to ask her if he addressed this at all.  Also, b12 was elevated and advised to decrease what she was taking (if any supplements have B12 in it).  Can Patricia Flynn or [place] call pt on Monday and check on her?  Thanks.    CC:  Edman Circle

## 2014-12-12 NOTE — Telephone Encounter (Signed)
Left message to call Nanami Whitelaw at [phone]. 

## 2014-12-13 ENCOUNTER — Other Ambulatory Visit (INDEPENDENT_AMBULATORY_CARE_PROVIDER_SITE_OTHER): Payer: PRIVATE HEALTH INSURANCE

## 2014-12-13 ENCOUNTER — Ambulatory Visit (INDEPENDENT_AMBULATORY_CARE_PROVIDER_SITE_OTHER): Payer: PRIVATE HEALTH INSURANCE | Admitting: Internal Medicine

## 2014-12-13 ENCOUNTER — Other Ambulatory Visit: Payer: Self-pay | Admitting: Nurse Practitioner

## 2014-12-13 ENCOUNTER — Encounter: Payer: Self-pay | Admitting: Internal Medicine

## 2014-12-13 VITALS — BP 116/90 | HR 68 | Ht 65.5 in | Wt 139.4 lb

## 2014-12-13 DIAGNOSIS — R143 Flatulence: Secondary | ICD-10-CM

## 2014-12-13 DIAGNOSIS — R1084 Generalized abdominal pain: Secondary | ICD-10-CM | POA: Diagnosis not present

## 2014-12-13 DIAGNOSIS — F32A Depression, unspecified: Secondary | ICD-10-CM

## 2014-12-13 DIAGNOSIS — IMO0001 Reserved for inherently not codable concepts without codable children: Secondary | ICD-10-CM

## 2014-12-13 DIAGNOSIS — F329 Major depressive disorder, single episode, unspecified: Secondary | ICD-10-CM

## 2014-12-13 DIAGNOSIS — F419 Anxiety disorder, unspecified: Secondary | ICD-10-CM

## 2014-12-13 DIAGNOSIS — F418 Other specified anxiety disorders: Secondary | ICD-10-CM

## 2014-12-13 LAB — IGA: IgA: 157 mg/dL (ref 68–378)

## 2014-12-13 MED ORDER — RIFAXIMIN 550 MG PO TABS: 550.0000 mg | ORAL_TABLET | Freq: Two times a day (BID) | ORAL | Status: DC

## 2014-12-13 MED ORDER — ESTRADIOL 10 MCG VA TABS: 1.0000 | ORAL_TABLET | VAGINAL | Status: DC

## 2014-12-13 NOTE — Progress Notes (Signed)
HISTORY OF PRESENT ILLNESS:  Patricia Flynn is a 69 y.o. female with a history of hypertension and breast cancer who presents today for evaluation of multiple GI complaints. Previous patient of Dr. Verl Flynn until his retirement. Evaluated in the office 11/14/2014 regarding a six-week history of pelvic pressure and abnormal CT scan of the abdomen and pelvis questioning sigmoid colon thickening or narrowing. See that dictation for details. She subsequently underwent complete colonoscopy 11/17/2014. The examination was normal throughout. She is status post cholecystectomy for cholelithiasis January 2014. As mentioned, she presents today with ongoing GI complaints. First, she reports hyperactive bowel sounds and churning particularly in the morning. She denies constipation or diarrhea. She also notices increased intestinal gas which causes cramping discomfort and is relieved with the passing of flatus. She also reports that she is "shaky" in the mornings. During the interview, she is tearful. She is planning a trip to [place] in 10 days and is worried to leave regarding her health. Current symptoms began after tripped [place] several months back. She does tell me that she had similar problems 2 years ago. Her primary care provider recommended antidepressant therapy but she declined and states "that's just not me". She has been avoiding peanuts for 5 days and feels this has been helpful. Gas-X seems to be helpful. She has been on probiotic Align for 5 days, at the recommendation of a friend. Symptoms are not necessarily exacerbated by meals. Often feels better after exercise. There has been no bleeding or weight loss. She does have a history of B12 deficiency. In waiting for this appointment, she scheduled evaluations with her gynecologist and urologist to rule out problems in those areas. She reports to me that the evaluations were negative. She did undergo blood work (reviewed) 12/08/2014. Comprehensive  metabolic panel was normal except for ALT of 37 and bilirubin of 2.3. 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 Vitamin D level was slightly decreased and B12 level elevated. CBC was normal. She did undergo upper endoscopy with Dr. Verl Flynn June 2013. Included were small bowel biopsies with no evidence for villous atrophy. She denies any of her medications are new. She will occasionally take one half alprazolam at night  REVIEW OF SYSTEMS:  All non-GI ROS negative by report upon comprehensive review of all systems  Past Medical History  Diagnosis Date  . Diverticulosis of colon (without mention of hemorrhage)   . History of breast cancer 1995  . Elevated LFTs   . Cancer     Left breast  . Hypertension 2012    newly diagnosed    Past Surgical History  Procedure Laterality Date  . Mastectomy  1995    left, followed by reconstruction   . Cholecystectomy  09/15/2012    Procedure: LAPAROSCOPIC CHOLECYSTECTOMY WITH INTRAOPERATIVE CHOLANGIOGRAM;  Surgeon: Patricia Lasso, MD;  Location: [facility];  Service: General;  Laterality: N/A;    Social History Patricia Flynn  reports that she quit smoking about 36 years ago. Her smoking use included Cigarettes. She has never used smokeless tobacco. She reports that she drinks about 4.2 - 6.0 oz of alcohol per week. She reports that she does not use illicit drugs.  family history includes Breast cancer (age of onset: 33) in her mother; Heart disease in her father. There is no history of Colon cancer or Stomach cancer.  No Known Allergies     PHYSICAL EXAMINATION: Vital signs: BP 116/90 mmHg  Pulse 68  Ht 5' 5.5" (1.664 m)  Wt 139 lb 6 oz (63.22 kg)  BMI 22.83 kg/m2 General: Well-developed, well-nourished, no acute distress HEENT: Sclerae are anicteric, conjunctiva pink. Oral mucosa intact Lungs: Clear Heart: Regular Abdomen: soft, nontender, nondistended, no obvious ascites, no peritoneal signs, normal bowel sounds. No  organomegaly. Extremities: No edema Psychiatric: alert and oriented x3. Cooperative. Anxious   ASSESSMENT:  #1. Chronic recurrent abdominal complaints as described. Suspect functional. Negative evaluations by PCP, urology, gynecology, and GI to date #2. Recent colonoscopy normal #3. Status post cholecystectomy January 2014. She did have cholelithiasis #4. Apparent Anxiety/depression. Patient denies #5. Upper endoscopy 2013 essentially unremarkable #6. History of breast cancer   PLAN:  #1. Tissue transglutaminase antibody and serum IgA to screen for celiac disease #2. Trial of Xifaxan 550 mg by mouth twice a day 2 weeks #3. Continue probiotic for 2 weeks total #4. Reassurance and counseling on functional abdominal complaints #5. Okay to continue Gas-X as this helps #6. Office follow-up after she returns from [place]  45 minutes was spent face-to-face with this patient. Greater than half that time was spent counseling regarding functional abdominal complaints and answering a myriad of questions and concerns

## 2014-12-13 NOTE — Patient Instructions (Signed)
Your physician has requested that you go to the basement for the following lab work before leaving today:  TTG, IGA  We have sent the following medications to your pharmacy for you to pick up at your convenience:  Xifaxan  Please follow up with Dr. Henrene Pastor on 02/01/2015 at 9:00am

## 2014-12-14 LAB — TISSUE TRANSGLUTAMINASE, IGG: Tissue Transglut Ab: 1 U/mL (ref <6)

## 2014-12-16 NOTE — Telephone Encounter (Signed)
Left message to call Advik Weatherspoon at [phone]. 

## 2014-12-22 NOTE — Telephone Encounter (Signed)
Left message to call Kaitlyn at [phone]. 

## 2014-12-26 NOTE — Telephone Encounter (Signed)
Dr.Miller, I have attempted to reach this patient x3 with no return call. Patient was seen with Dr.Perry at [facility] on 12/13/2014. Would you like me to send letter regarding lab results?

## 2014-12-27 ENCOUNTER — Ambulatory Visit: Payer: PRIVATE HEALTH INSURANCE | Admitting: Obstetrics & Gynecology

## 2014-12-27 ENCOUNTER — Ambulatory Visit: Payer: PRIVATE HEALTH INSURANCE | Admitting: Internal Medicine

## 2014-12-27 NOTE — Telephone Encounter (Signed)
Reviewed GI note.  Pt has follow up with both me and Dr. Henrene Pastor in May.  Ok to stop calling.  No letter needed.  Labs were reviewed with GI as well.  Can close encounter.  THanks.

## 2015-01-02 ENCOUNTER — Ambulatory Visit: Payer: 59 | Admitting: Obstetrics & Gynecology

## 2015-01-05 ENCOUNTER — Other Ambulatory Visit: Payer: Self-pay | Admitting: Obstetrics & Gynecology

## 2015-01-05 NOTE — Telephone Encounter (Signed)
Medication refill request: Xanax 0.5 mg  Last AEX:  12/27/13 with SM  Next AEX: No AEX scheduled Last MMG (if hormonal medication request): N/A Refill authorized: Please advise.  Called patient she says she was seen for AEX 12/08/14 with Ms. Patty, and that she is going out of town and is needing this rx, please advise Dr. [name] Heck.

## 2015-01-05 NOTE — Telephone Encounter (Signed)
Patient notified that rx has been sent to pharmacy  

## 2015-01-23 ENCOUNTER — Ambulatory Visit: Payer: PRIVATE HEALTH INSURANCE | Admitting: Internal Medicine

## 2015-01-25 ENCOUNTER — Encounter: Payer: Self-pay | Admitting: Internal Medicine

## 2015-01-25 ENCOUNTER — Ambulatory Visit (INDEPENDENT_AMBULATORY_CARE_PROVIDER_SITE_OTHER): Payer: PRIVATE HEALTH INSURANCE | Admitting: Internal Medicine

## 2015-01-25 VITALS — BP 102/80 | HR 68 | Ht 65.5 in | Wt 141.0 lb

## 2015-01-25 DIAGNOSIS — F419 Anxiety disorder, unspecified: Secondary | ICD-10-CM

## 2015-01-25 DIAGNOSIS — K589 Irritable bowel syndrome without diarrhea: Secondary | ICD-10-CM

## 2015-01-25 DIAGNOSIS — F418 Other specified anxiety disorders: Secondary | ICD-10-CM | POA: Diagnosis not present

## 2015-01-25 DIAGNOSIS — R1084 Generalized abdominal pain: Secondary | ICD-10-CM | POA: Diagnosis not present

## 2015-01-25 DIAGNOSIS — F329 Major depressive disorder, single episode, unspecified: Secondary | ICD-10-CM

## 2015-01-25 NOTE — Progress Notes (Signed)
HISTORY OF PRESENT ILLNESS:   Patricia Flynn is a 69 y.o. female initially evaluated in the office, for a myriad of GI complaints, 12/13/2014. See that dictation for details. The clinical impression was chronic recurrent abdominal complaints felt to be functional with negative evaluations by multiple specialists. As well, apparent anxiety/depression not appreciated by the patient. Testing for celiac disease was negative. Extensive counseling reassurance provided. Trial of Xifaxan 550 mg twice daily for 2 weeks prescribed. Routine follow-up today as recommended. Patient reports marked improvement in her symptoms shortly after her office visit and initiation of Xifaxan. She has successful trip to [place]. States currently that she is feeling well. She has several excellent questions regarding her condition and workup. No new complaints  REVIEW OF SYSTEMS:  All non-GI ROS negative upon review  Past Medical History  Diagnosis Date  . Diverticulosis of colon (without mention of hemorrhage)   . History of breast cancer 1995  . Elevated LFTs   . Cancer     Left breast  . Hypertension 2012    newly diagnosed    Past Surgical History  Procedure Laterality Date  . Mastectomy  1995    left, followed by reconstruction   . Cholecystectomy  09/15/2012    Procedure: LAPAROSCOPIC CHOLECYSTECTOMY WITH INTRAOPERATIVE CHOLANGIOGRAM;  Surgeon: Haywood Lasso, MD;  Location: [facility];  Service: General;  Laterality: N/A;    Social History Patricia Flynn  reports that she quit smoking about 36 years ago. Her smoking use included Cigarettes. She has never used smokeless tobacco. She reports that she drinks about 4.2 - 6.0 oz of alcohol per week. She reports that she does not use illicit drugs.  family history includes Breast cancer (age of onset: 65) in her mother; Heart disease in her father. There is no history of Colon cancer or Stomach cancer.  No Known Allergies     PHYSICAL EXAMINATION: Vital  signs: BP 102/80 mmHg  Pulse 68  Ht 5' 5.5" (1.664 m)  Wt 141 lb (63.957 kg)  BMI 23.10 kg/m2 General: Well-developed, well-nourished, no acute distress HEENT: Sclerae are anicteric, conjunctiva pink. Oral mucosa intact Lungs: Clear Heart: Regular Abdomen: soft, nontender, nondistended, no obvious ascites, no peritoneal signs, normal bowel sounds. No organomegaly. Extremities: No edema Psychiatric: alert and oriented x3. Cooperative   ASSESSMENT:  #1. Chronic functional abdominal complaints. Currently doing well #2. Health related anxiety #3. Possible underlying anxiety/depression #4. Normal colonoscopy 11/17/2014 #5. Status post cholecystectomy January 2014   PLAN:  #1. Review of her workup, discussion of her condition, and answering questions #2. GI follow-up as needed  15 minutes was spent with this patient's evaluation. Greater than 50% of the time use for counseling

## 2015-01-25 NOTE — Patient Instructions (Addendum)
Please follow up with Dr. Perry as needed 

## 2015-02-01 ENCOUNTER — Ambulatory Visit: Payer: PRIVATE HEALTH INSURANCE | Admitting: Internal Medicine

## 2015-02-15 NOTE — Telephone Encounter (Signed)
Okay to close encounter?  °

## 2015-04-24 ENCOUNTER — Other Ambulatory Visit: Payer: Self-pay | Admitting: Nurse Practitioner

## 2015-04-24 NOTE — Telephone Encounter (Signed)
Patient has hx of breast cancer and is taking a vaginal estrogen.   Needs AEX for RFs.  RX declined to pharmacy with note that she needs AEX.  OK to close encounter.

## 2015-04-24 NOTE — Telephone Encounter (Signed)
Medication refill request: Vagifem 10 mcg  Last AEX:  12/27/13 with SM  Next AEX: No AEX scheduled for 2016 Last MMG (if hormonal medication request): 01/23/15 [facility] bi-rads 2: benign (care everywhere) Refill authorized: ?  Left Message To Call Back needs AEX

## 2015-04-24 NOTE — Telephone Encounter (Signed)
Medication refill request: Vagifem  Last AEX:  12/27/13  Next AEX: has canceled 2 appts  Last MMG (if hormonal medication request): ? Refill authorized: please advise.

## 2015-04-24 NOTE — Telephone Encounter (Signed)
Patient called back she was not particularly nice to me, she said she doesn't understand why she needs to come in for another exam she just saw Ms. Patty 12/08/2014 for a problem visit and she considers that to be her annual exam. I tried to explain to patient our protocol in regards to her being on a hormone and this needs to be followed up but patient doesn't think she needs to come in for another exam, and wants her vagifem sent for a whole year.   Dr. [name] Heck please advise.

## 2015-04-25 ENCOUNTER — Telehealth: Payer: Self-pay | Admitting: Nurse Practitioner

## 2015-04-25 NOTE — Telephone Encounter (Signed)
Megan Salon, MD at 04/24/2015 4:34 PM     Status: Signed       Expand All Collapse All   Patient has hx of breast cancer and is taking a vaginal estrogen. Needs AEX for RFs. RX declined to pharmacy with note that she needs AEX. OK to close encounter.

## 2015-04-25 NOTE — Telephone Encounter (Signed)
Spoke with patient. Patient states she would like to talk about getting her Vagifem refilled because she was told yesterday she would need to be seen for an appointment before getting refills. Advised per note from [facility] as seen below from refill encounter on 04/24/2015 patient will need to be seen for aex. Patient is very upset and frustrated. Feels this recommendation is "unneccesary" since she was seen in April with Milford Cage, FNP. Advised this was for a problem visit and not an aex. "It turned out not to be a problem and was GI related. I do not see why I need to come in and spend extra money. This is ridiculous." Advised patient with history of breast cancer must be seen for an aex to receive further refills on vaginal estrogen. Patient states she is seeing multiple doctors and was seen recently with our office and this is not needed. Again advised patient this is important and protocol to provide her the best care. Patient is agreeable to schedule. Appointment scheduled for 05/15/2015 at 1:30pm with Dr.Miller. Patient is agreeable to date and time.  Routing to provider for final review. Patient agreeable to disposition. Will close encounter.   Patient aware provider will review message and nurse will return call if any additional advice or change of disposition.

## 2015-04-25 NOTE — Telephone Encounter (Signed)
Patient would like to speak with nurse about a refill for vagifem that she was told she needed to come in for.

## 2015-05-15 ENCOUNTER — Encounter: Payer: Self-pay | Admitting: Obstetrics & Gynecology

## 2015-05-15 ENCOUNTER — Ambulatory Visit (INDEPENDENT_AMBULATORY_CARE_PROVIDER_SITE_OTHER): Payer: PRIVATE HEALTH INSURANCE | Admitting: Obstetrics & Gynecology

## 2015-05-15 VITALS — BP 102/68 | HR 60 | Resp 16 | Ht 65.25 in | Wt 140.0 lb

## 2015-05-15 DIAGNOSIS — Z01419 Encounter for gynecological examination (general) (routine) without abnormal findings: Secondary | ICD-10-CM

## 2015-05-15 MED ORDER — ESTRADIOL 10 MCG VA TABS: 1.0000 | ORAL_TABLET | VAGINAL | Status: DC

## 2015-05-15 NOTE — Progress Notes (Addendum)
69 y.o.G3P3. MarriedCaucasianF here for annual exam.  Doing well.  Pt seen in April for abdominal pain.  This has been an on-going issue for her for years.  Pt traveled to [place] in early April and then started having increased pain.  This was also accompanied by increased.  Pt was ultimately seen by Dr. Henrene Pastor.  Treated with Xifaxan bid x 14. Pt reports she is feeling much better.    No vaginal bleeding.  She is using the Vagifem. Pt reports she has discussed this with oncologist at [facility] and they are "ok" with this.  Notes reviewed through Care Everywhere.  2015 note mentions Premarin cream to use with "time limited use".  2016 note does not specifically mention this, however, there are directions regarding use of estrogen cream so it is reasonable to assume this was part of their discussion.    Going to [place] for 69th birthday with entire family.    PCP:  Dr. Joylene Draft.  Has lab work tomorrow.  Will have BMD next visit.     No LMP recorded. Patient is postmenopausal.          Sexually active: Yes.    The current method of family planning is post menopausal status.    Exercising: Yes.    walking, bike, golf, and weights Smoker:  Former smoker  Health Maintenance: Pap:  12/27/13 WNL History of abnormal Pap:  no MMG:  5/16 at [facility], did 3D.  Reviewed while she was in office.   Colonoscopy:  5/16-repeat in 10 years BMD:   2013 [facility]  TDaP:  UTD Screening Labs: PCP, Hb today: PCP, Urine today: PCP   reports that she quit smoking about 36 years ago. Her smoking use included Cigarettes. She has never used smokeless tobacco. She reports that she drinks about 8.4 oz of alcohol per week. She reports that she does not use illicit drugs.  Past Medical History  Diagnosis Date  . Diverticulosis of colon (without mention of hemorrhage)   . History of breast cancer 1995  . Elevated LFTs   . Cancer     Left breast  . Hypertension 2012    newly diagnosed    Past Surgical History   Procedure Laterality Date  . Mastectomy  1995    left, followed by reconstruction   . Cholecystectomy  09/15/2012    Procedure: LAPAROSCOPIC CHOLECYSTECTOMY WITH INTRAOPERATIVE CHOLANGIOGRAM;  Surgeon: Haywood Lasso, MD;  Location: [place] [facility];  Service: General;  Laterality: N/A;    Current Outpatient Prescriptions  Medication Sig Dispense Refill  . ALPRAZolam (XANAX) 0.5 MG tablet TAKE ONE-HALF TABLET AT BEDTIME AS NEEDED 30 tablet 1  . Ascorbic Acid (VITAMIN C) 1000 MG tablet Take 1,000 mg by mouth daily.    . B Complex Vitamins (B COMPLEX PO) Take by mouth.    . BYSTOLIC 5 MG tablet Take 2.5 mg by mouth at bedtime.     . Cyanocobalamin (NASCOBAL) 500 MCG/0.1ML SOLN Place 0.1 mLs into the nose once a week. Spray in nasal weekly- Wednesday 1.3 mL 3  . Estradiol 10 MCG TABS vaginal tablet Place 1 tablet (10 mcg total) vaginally 2 (two) times a week. 24 tablet 0   No current facility-administered medications for this visit.    Family History  Problem Relation Age of Onset  . Breast cancer Mother 84  . Heart disease Father     2 MIs   . Colon cancer Neg Hx   . Stomach cancer Neg Hx  ROS:  Pertinent items are noted in HPI.  Otherwise, a comprehensive ROS was negative.  Exam:   BP 102/68 mmHg  Pulse 60  Resp 16  Ht 5' 5.25" (1.657 m)  Wt 140 lb (63.504 kg)  BMI 23.13 kg/m2  Weight change: no change  Height:   Height: 5' 5.25" (165.7 cm)  Ht Readings from Last 3 Encounters:  05/15/15 5' 5.25" (1.657 m)  01/25/15 5' 5.5" (1.664 m)  12/13/14 5' 5.5" (1.664 m)    General appearance: alert, cooperative and appears stated age Head: Normocephalic, without obvious abnormality, atraumatic Neck: no adenopathy, supple, symmetrical, trachea midline and thyroid normal to inspection and palpation Lungs: clear to auscultation bilaterally Breasts: absent left breast, implant present.  No masses, skin change, or LAD.  Normal right breast without nipple retraction, skin changes,  LAD. Heart: regular rate and rhythm Abdomen: soft, non-tender; bowel sounds normal; no masses,  no organomegaly Extremities: extremities normal, atraumatic, no cyanosis or edema Skin: Skin color, texture, turgor normal. No rashes or lesions Lymph nodes: Cervical, supraclavicular, and axillary nodes normal. No abnormal inguinal nodes palpated Neurologic: Grossly normal   Pelvic: External genitalia:  no lesions              Urethra:  normal appearing urethra with no masses, tenderness or lesions              Bartholins and Skenes: normal                 Vagina: normal appearing vagina with normal color and discharge, no lesions              Cervix: no lesions              Pap taken: No. Bimanual Exam:  Uterus:  normal size, contour, position, consistency, mobility, non-tender              Adnexa: normal adnexa and no mass, fullness, tenderness               Rectovaginal: Confirms               Anus:  normal sphincter tone, no lesions  Chaperone was present for exam.  A:  Well Woman with normal exam PMP, no HRT H/O breast cancer s/p mastectomy 1995. Does mammogram yearly at [place] Dyspareunia due to atrophic changes.  I continue to feel more comfortable with Vagifem use vs. Vaginal estrogen cream use.    P: Mammogram yearly at [facility].  Doing 3D as well.  Reviewed in Care Everywhere pap smear 2015.  No pap today. Will see Dr. Joylene Draft this week.  BMD is also scheduled with him as well. Vagifem 10 meq pv twice weekly.  #24/4RF. return annually or prn

## 2015-05-16 NOTE — Addendum Note (Signed)
Addended by: Megan Salon on: 05/16/2015 06:58 AM   Modules accepted: Miquel Dunn

## 2016-04-25 ENCOUNTER — Other Ambulatory Visit: Payer: Self-pay

## 2016-04-25 NOTE — Telephone Encounter (Signed)
Medication refill request: Alprazolam 0.5mg  Last AEX:  05/15/15 SM Next AEX: 09/13/16  Refill authorized: 01/05/15 #30 w/1 refill; today please advise, thank you

## 2016-04-26 MED ORDER — ALPRAZOLAM 0.5 MG PO TABS: ORAL_TABLET | ORAL | 0 refills | Status: DC

## 2016-04-26 NOTE — Telephone Encounter (Signed)
Dr. [name] Heck, please see note below. Patient is going out of town tomorrow and would like medication called in for pick up today.   Thanks, Summer

## 2016-04-26 NOTE — Telephone Encounter (Signed)
Patient is asking for the refill status of her Alprazolam. Patient is going out of the country tomorrow and needs to pick this up today.

## 2016-04-26 NOTE — Telephone Encounter (Signed)
Routed to triage by accident. °

## 2016-05-17 ENCOUNTER — Encounter: Payer: Self-pay | Admitting: Obstetrics & Gynecology

## 2016-05-17 ENCOUNTER — Ambulatory Visit: Payer: PRIVATE HEALTH INSURANCE | Admitting: Obstetrics & Gynecology

## 2016-05-17 VITALS — BP 110/88 | HR 68 | Resp 14 | Ht 65.25 in | Wt 143.0 lb

## 2016-05-17 DIAGNOSIS — Z124 Encounter for screening for malignant neoplasm of cervix: Secondary | ICD-10-CM

## 2016-05-17 DIAGNOSIS — Z01419 Encounter for gynecological examination (general) (routine) without abnormal findings: Secondary | ICD-10-CM

## 2016-05-17 MED ORDER — ESTRADIOL 10 MCG VA TABS: 1.0000 | ORAL_TABLET | VAGINAL | 4 refills | Status: DC

## 2016-05-17 MED ORDER — TRIAMCINOLONE ACETONIDE 0.025 % EX OINT: 1.0000 "application " | TOPICAL_OINTMENT | Freq: Two times a day (BID) | CUTANEOUS | 0 refills | Status: DC

## 2016-05-17 NOTE — Patient Instructions (Signed)
Just remember to get your Hep C testing done with your next blood work.

## 2016-05-17 NOTE — Progress Notes (Signed)
70 y.o. G3P3 MarriedCaucasianF here for annual exam.  Doing well.  Went to [place], alone, with two grandchildren who are both 56 and has just gotten back from trip to [place] with group of four couples for golf and touring [place].  Wonderful trip!  Has not had abdominal issues, of significance, since last year.  Is feeling really good.  Does report light rectal bleeding that she notes only on wiping from time to time.  This is always bright red and just with wiping.  This only occurs after she is feeling some rectal irritation, like she hasn't wiped well with a previous bowel movement.  Had completely negative 3/16.  Reviewed in chart.  Denies vaginal bleeding.    Does have a couple of questions.  Has noticed some tingling in her right breast.  H/O left breast cancer and right reduction.  No masses or skin changes.  No nipple discharge.  Did have a MMG in 6/17 at [facility].  Reviewed through [place].     PCP is Dr. Joylene Draft.  Recommended starting a statin.  She took a statin and she had achy joints, had knee pains.  She was 5mg  lipitor.  Has decided to stop it.  Wants my opinion.  Advised I am not an expert in this field.  Only advised this--she is in control of what she chooses to take or not take and if she does not want to take something, it is ultimately her decision.  No LMP recorded. Patient is postmenopausal.          Sexually active: Yes.    The current method of family planning is post menopausal status.    Exercising: Yes.    daily - walking and biking, weights Smoker:  no  Health Maintenance: Pap:  12/27/13 Neg  History of abnormal Pap:  no MMG: 02/29/16 Right Negative. [place]. Hx Left Mastectomy Colonoscopy:  11/17/14 Normal  BMD:   2016  TDaP:  Current with PCP Pneumonia vaccine(s):  Done Zostavax:   2007  Hep C testing: D/w pt having this done.  Will have done with next labs with Dr. Joylene Draft. Screening Labs: PCP   reports that she quit smoking about 37 years ago. Her  smoking use included Cigarettes. She has never used smokeless tobacco. She reports that she drinks about 8.4 oz of alcohol per week . She reports that she does not use drugs.  Past Medical History:  Diagnosis Date  . Cancer [facility])    Left breast  . Diverticulosis of colon (without mention of hemorrhage)   . Elevated LFTs   . History of breast cancer 1995  . Hypertension 2012   newly diagnosed    Past Surgical History:  Procedure Laterality Date  . CHOLECYSTECTOMY  09/15/2012   Procedure: LAPAROSCOPIC CHOLECYSTECTOMY WITH INTRAOPERATIVE CHOLANGIOGRAM;  Surgeon: Haywood Lasso, MD;  Location: [facility];  Service: General;  Laterality: N/A;  . [place]   left, followed by reconstruction     Current Outpatient Prescriptions  Medication Sig Dispense Refill  . ALPRAZolam (XANAX) 0.5 MG tablet TAKE ONE-HALF TABLET AT BEDTIME AS NEEDED 30 tablet 0  . Ascorbic Acid (VITAMIN C) 1000 MG tablet Take 1,000 mg by mouth daily.    . B Complex Vitamins (B COMPLEX PO) Take by mouth.    . BYSTOLIC 5 MG tablet Take 2.5 mg by mouth at bedtime.     . Cyanocobalamin (NASCOBAL) 500 MCG/0.1ML SOLN Place 0.1 mLs into the nose once a  week. Spray in nasal weekly- Wednesday 1.3 mL 3  . Estradiol 10 MCG TABS vaginal tablet Place 1 tablet (10 mcg total) vaginally 2 (two) times a week. 24 tablet 4   No current facility-administered medications for this visit.     Family History  Problem Relation Age of Onset  . Breast cancer Mother 70  . Heart disease Father     2 MIs   . Colon cancer Neg Hx   . Stomach cancer Neg Hx     ROS:  Pertinent items are noted in HPI.  Otherwise, a comprehensive ROS was negative.  Exam:   BP 110/88 (BP Location: Right Arm, Patient Position: Sitting, Cuff Size: Normal)   Pulse 68   Resp 14   Ht 5' 5.25" (1.657 m)   Wt 143 lb (64.9 kg)   BMI 23.61 kg/m     Height: 5' 5.25" (165.7 cm)  Ht Readings from Last 3 Encounters:  05/17/16 5' 5.25" (1.657 m)  05/15/15 5'  5.25" (1.657 m)  01/25/15 5' 5.5" (1.664 m)   General appearance: alert, cooperative and appears stated age Head: Normocephalic, without obvious abnormality, atraumatic Neck: no adenopathy, supple, symmetrical, trachea midline and thyroid normal to inspection and palpation Lungs: clear to auscultation bilaterally Breasts: s/p mammoplasty on right breast with well healed scars.  No masses.  No skin changes.  No nipple discharge.  Left breast has been reconstructed.  No masses.  No LAD either axilla. Heart: regular rate and rhythm Abdomen: soft, non-tender; bowel sounds normal; no masses,  no organomegaly Extremities: extremities normal, atraumatic, no cyanosis or edema Skin: Skin color, texture, turgor normal. No rashes or lesions Lymph nodes: Cervical, supraclavicular, and axillary nodes normal. No abnormal inguinal nodes palpated Neurologic: Grossly normal  Pelvic: External genitalia:  no lesions              Urethra:  normal appearing urethra with no masses, tenderness or lesions              Bartholins and Skenes: normal                 Vagina: normal appearing vagina with normal color and discharge, no lesions              Cervix: no lesions              Pap taken: Yes.   Bimanual Exam:  Uterus:  normal size, contour, position, consistency, mobility, non-tender              Adnexa: normal adnexa and no mass, fullness, tenderness               Rectovaginal: Confirms               Anus:  normal sphincter tone, no lesions, no hemorrhoids, no fissure, no skin changes.  Guaiac neg today. Chaperone was present for exam.  A:      Well Woman with normal exam PMP, no HRT H/O breast cancer s/p mastectomy 1995. Does mammogram yearly at [facility].  Now with recent right breast "tingling" Vaginal atrophic changes with associated dyspareunia    P: Pt is having yearly 3D MMG at [facility].  Do not feel diagnosted imaging needed at this point.  She will monitor and call in another month if the  sensation does not resolve. pap smear 2015.  Pap obtained today. Vagifem 10 meq pv twice weekly.  #24/4RF.  Pt has okayed this with her oncologist at [facility] 0.025%  ointment BID for two or three days when has rectal irritation.  No cause for bleeding noted on exam today.  If this continues, she knows to call.  I would have her follow up with GI at that point.  Rx to pharmacy. Labs and blood work done with Dr. Joylene Draft.

## 2016-05-20 LAB — IPS PAP SMEAR ONLY

## 2016-05-22 ENCOUNTER — Telehealth: Payer: Self-pay | Admitting: *Deleted

## 2016-05-22 NOTE — Telephone Encounter (Signed)
Patient returned call. Notified of results. Patient verbalized understanding.

## 2016-05-22 NOTE — Telephone Encounter (Signed)
Message left to return call to Daimien Patmon at [phone].    

## 2016-05-22 NOTE — Telephone Encounter (Signed)
-----   Message from Megan Salon, MD sent at 05/22/2016  5:41 AM EDT ----- Inform pt pap was normal.  When she was in the office, she had questions about alternative options for elevated cholesterol.  I couldn't think of Jaime at [facility] ([address] near [facility]).  This is a possibility of someone she talk with about possible other options for her cholesterol.  Just a thought.

## 2016-06-21 ENCOUNTER — Ambulatory Visit (INDEPENDENT_AMBULATORY_CARE_PROVIDER_SITE_OTHER): Payer: PRIVATE HEALTH INSURANCE | Admitting: Sports Medicine

## 2016-06-28 ENCOUNTER — Ambulatory Visit (INDEPENDENT_AMBULATORY_CARE_PROVIDER_SITE_OTHER): Payer: PRIVATE HEALTH INSURANCE | Admitting: Sports Medicine

## 2016-07-18 ENCOUNTER — Ambulatory Visit (INDEPENDENT_AMBULATORY_CARE_PROVIDER_SITE_OTHER): Payer: PRIVATE HEALTH INSURANCE | Admitting: Sports Medicine

## 2016-09-13 ENCOUNTER — Ambulatory Visit: Payer: PRIVATE HEALTH INSURANCE | Admitting: Obstetrics & Gynecology

## 2017-05-19 ENCOUNTER — Ambulatory Visit (INDEPENDENT_AMBULATORY_CARE_PROVIDER_SITE_OTHER): Payer: PRIVATE HEALTH INSURANCE | Admitting: Obstetrics & Gynecology

## 2017-05-19 ENCOUNTER — Encounter: Payer: Self-pay | Admitting: Obstetrics & Gynecology

## 2017-05-19 VITALS — BP 100/60 | HR 60 | Resp 16 | Ht 65.5 in | Wt 140.0 lb

## 2017-05-19 DIAGNOSIS — Z01419 Encounter for gynecological examination (general) (routine) without abnormal findings: Secondary | ICD-10-CM | POA: Diagnosis not present

## 2017-05-19 MED ORDER — ESTRADIOL 10 MCG VA TABS: 1.0000 | ORAL_TABLET | VAGINAL | 4 refills | Status: DC

## 2017-05-19 NOTE — Progress Notes (Signed)
71 y.o. G3P3 Married Caucasian F here for annual exam.  Doing well.  Denies vaginal bleeding.  PCP:  Has appt with Dr. Joylene Draft in December.    No LMP recorded. Patient is postmenopausal.          Sexually active: Yes.    The current method of family planning is post menopausal status.    Exercising: Yes.    biking/ walking/weights/ golfing Smoker:  Former smoker  Health Maintenance: Pap:  05/17/16 negative, 12/27/13 negative History of abnormal Pap:  no MMG: 09/17/16 BIRADS 1 negative   Colonoscopy:  11/17/14 normal, Dr. Henrene Pastor BMD:   2016  TDaP:  UTD with PCP  Pneumonia vaccine(s):  PCP Zostavax:   06/23/06  Hep C testing: PCP Screening Labs: PCP does labs   reports that she quit smoking about 38 years ago. Her smoking use included Cigarettes. She has never used smokeless tobacco. She reports that she drinks about 8.4 oz of alcohol per week . She reports that she does not use drugs.  Past Medical History:  Diagnosis Date  . Cancer [facility])    Left breast  . Diverticulosis of colon (without mention of hemorrhage)   . Elevated LFTs   . History of breast cancer 1995  . Hypertension 2012   newly diagnosed    Past Surgical History:  Procedure Laterality Date  . CHOLECYSTECTOMY  09/15/2012   Procedure: LAPAROSCOPIC CHOLECYSTECTOMY WITH INTRAOPERATIVE CHOLANGIOGRAM;  Surgeon: Haywood Lasso, MD;  Location: [place];  Service: General;  Laterality: N/A;  . [place]   left, followed by reconstruction     Current Outpatient Prescriptions  Medication Sig Dispense Refill  . ALPRAZolam (XANAX) 0.5 MG tablet TAKE ONE-HALF TABLET AT BEDTIME AS NEEDED 30 tablet 0  . Ascorbic Acid (VITAMIN C) 1000 MG tablet Take 1,000 mg by mouth daily.    . B Complex Vitamins (B COMPLEX PO) Take by mouth.    . BYSTOLIC 5 MG tablet Take 2.5 mg by mouth at bedtime.     . Cyanocobalamin (NASCOBAL) 500 MCG/0.1ML SOLN Place 0.1 mLs into the nose once a week. Spray in nasal weekly- Wednesday 1.3 mL 3  .  Estradiol 10 MCG TABS vaginal tablet Place 1 tablet (10 mcg total) vaginally 2 (two) times a week. 24 tablet 4  . triamcinolone (KENALOG) 0.025 % ointment Apply 1 application topically 2 (two) times daily. Use for no more than 7 days. 30 g 0   No current facility-administered medications for this visit.     Family History  Problem Relation Age of Onset  . Breast cancer Mother 67  . Heart disease Father        2 MIs   . Colon cancer Neg Hx   . Stomach cancer Neg Hx     ROS:  Pertinent items are noted in HPI.  Otherwise, a comprehensive ROS was negative.  Exam:   BP 100/60 (BP Location: Right Arm, Patient Position: Sitting, Cuff Size: Normal)   Pulse 60   Resp 16   Ht 5' 5.5" (1.664 m)   Wt 140 lb (63.5 kg)   BMI 22.94 kg/m     Height: 5' 5.5" (166.4 cm)  Ht Readings from Last 3 Encounters:  05/19/17 5' 5.5" (1.664 m)  05/17/16 5' 5.25" (1.657 m)  05/15/15 5' 5.25" (1.657 m)    General appearance: alert, cooperative and appears stated age Head: Normocephalic, without obvious abnormality, atraumatic Neck: no adenopathy, supple, symmetrical, trachea midline and thyroid normal to inspection  and palpation Lungs: clear to auscultation bilaterally Breasts: Right breast without masses, skin changes, nipple discharge, LAD; left breast s/p mastectomy with reconstruction, no masses, skin changes, LAD Heart: regular rate and rhythm Abdomen: soft, non-tender; bowel sounds normal; no masses,  no organomegaly Extremities: extremities normal, atraumatic, no cyanosis or edema Skin: Skin color, texture, turgor normal. No rashes or lesions Lymph nodes: Cervical, supraclavicular, and axillary nodes normal. No abnormal inguinal nodes palpated Neurologic: Grossly normal   Pelvic: External genitalia:  no lesions              Urethra:  normal appearing urethra with no masses, tenderness or lesions              Bartholins and Skenes: normal                 Vagina: normal appearing vagina with  normal color and discharge, no lesions              Cervix: no lesions              Pap taken: No. Bimanual Exam:  Uterus:  normal size, contour, position, consistency, mobility, non-tender              Adnexa: normal adnexa and no mass, fullness, tenderness               Rectovaginal: Confirms               Anus:  normal sphincter tone, no lesions  Chaperone was present for exam.  A:  Well Woman with normal exam PMP, no HRT H/O breast cancer s/p mastectomy 1995.  Does yearly MMG at [facility].   Vaginal atrophic changes with associated dyspareunia  P:   Mammogram yearly.  Pt doing at [facility] and doing 3D.  Can review in [place]. pap smear neg 9/17.  No pap smear obtained today. RF for vagifem 78meq pv twice weekly.  #24/4RF.  Pt has okayed this with her oncologist at [facility]. Does not need Kenalog RF.  Will call if does. return annually or prn

## 2017-05-22 ENCOUNTER — Encounter: Payer: Self-pay | Admitting: Obstetrics & Gynecology

## 2017-07-07 ENCOUNTER — Other Ambulatory Visit: Payer: Self-pay | Admitting: Obstetrics & Gynecology

## 2017-07-07 NOTE — Telephone Encounter (Signed)
Medication refill request: xanax 0.5mg   Last AEX:  05/19/17 SM  Next AEX: 07/24/18  Last MMG (if hormonal medication request): 09/17/16 BIRADS 1 negative  Refill authorized: 04/26/16 #30, 0RF. Today, please advise

## 2017-07-11 NOTE — Telephone Encounter (Signed)
Rx faxed today 

## 2018-06-08 ENCOUNTER — Other Ambulatory Visit: Payer: Self-pay | Admitting: Obstetrics & Gynecology

## 2018-06-08 NOTE — Telephone Encounter (Signed)
Medication refill request: Estradiol Last AEX:  05/19/2017 Next AEX: 07/24/2018 Last MMG (if hormonal medication request):  Refill authorized: #24, 4 refills

## 2018-07-03 ENCOUNTER — Telehealth: Payer: Self-pay | Admitting: Obstetrics & Gynecology

## 2018-07-03 NOTE — Telephone Encounter (Signed)
Patient is having symptoms of an infection and would like to see Dr [name] Heck Monday.

## 2018-07-03 NOTE — Telephone Encounter (Signed)
Spoke with patient and she is currently in [place].  States in August her PCP treated her for a UTI with Bactrim.  Feels she has the same symptoms now, but they do not feel like a typical UTI to her her. She c/o one day of lower pelvic pain that radiates to her back. No injuries. No gross hematuria. No pain with voiding. No frequency of urination.  Has Cipro that her PCP has given her for traveling. Wants to know if she should take it.  She would like to see Dr. [name] Heck.  Appointment scheduled with Dr. [name] Heck for Monday per patient request.  Pt states she would like to have a urine culture done because she is concerned about recurrent UTI sx.  Advised only to take Cipro per the instructions per her PCP.  Advised can seek care at urgent care while out of town and request that culture be sent and results from culture can be requested at her appointment on Monday. Did not recommend waiting for treatment if having flank/back pain. ER precautions discussed.  Patient accepts advice and appointment for Monday with Dr. [name] Heck.  Routing to Dr. [name] Heck and will close encounter.

## 2018-07-06 ENCOUNTER — Telehealth: Payer: Self-pay | Admitting: *Deleted

## 2018-07-06 ENCOUNTER — Ambulatory Visit (INDEPENDENT_AMBULATORY_CARE_PROVIDER_SITE_OTHER): Payer: PRIVATE HEALTH INSURANCE | Admitting: Obstetrics & Gynecology

## 2018-07-06 DIAGNOSIS — R399 Unspecified symptoms and signs involving the genitourinary system: Secondary | ICD-10-CM

## 2018-07-06 LAB — POCT URINALYSIS DIPSTICK
Bilirubin, UA: NEGATIVE
Glucose, UA: NEGATIVE
KETONES UA: NEGATIVE
NITRITE UA: NEGATIVE
PROTEIN UA: NEGATIVE
Urobilinogen, UA: 0.2 E.U./dL
pH, UA: 7 (ref 5.0–8.0)

## 2018-07-06 MED ORDER — SULFAMETHOXAZOLE-TRIMETHOPRIM 800-160 MG PO TABS: 1.0000 | ORAL_TABLET | Freq: Two times a day (BID) | ORAL | 0 refills | Status: AC

## 2018-07-06 NOTE — Progress Notes (Signed)
Called patient. Left voicemail to inform appt canceled for today. UA C&S sent.  Bactrim sent to pharmacy per Dr. [name] Heck.

## 2018-07-06 NOTE — Telephone Encounter (Signed)
Patient returned call to office, acknowledged message received regarding abx and OV.  Patient states she has already started Cipro abx prescribed by PCP. Patient is uncertain of dose. Took one dose last night and again this morning after UA. Patient states bactrim did not resolve her urinary symptoms previously, requesting to continue Cipro or alternative abx "to clear UTI completely".   Advised patient can take urine culture 3 days to return, will then make determination if change is needed to abx. Patient states she will be leaving out of town on Wed for 10 days, would like to see Dr. [name] Heck prior to leaving.   Patient request to cancel AEX for 07/24/18 due to work conflict. AEX rescheduled to 11/20/18 at 3:15pm. Patient placed on wait list as requested. States she will be traveling Jan -March.   Advised will review with Dr. [name] Heck and return call, patient agreeable.   Routing to Dr. [name] Heck to review and advise.

## 2018-07-07 LAB — URINE CULTURE: ORGANISM ID, BACTERIA: NO GROWTH

## 2018-07-07 LAB — URINALYSIS, MICROSCOPIC ONLY: Casts: NONE SEEN /lpf

## 2018-07-07 NOTE — Telephone Encounter (Signed)
Call reviewed with Dr. [name] Heck, call returned to patient. Patient has Rx for Cipro 500mg . Advised patient she can take Cipro 500mg  bid x3 days. Will return call once UC results return. Patient request OV on 07/20/18 at 4pm, OV scheduled with Dr. [name] Heck. Patient thankful for return call. Request results be called to her mobile number as she will be traveling.  Routing to provider for final review. Patient is agreeable to disposition. Will close encounter.

## 2018-07-20 ENCOUNTER — Ambulatory Visit (INDEPENDENT_AMBULATORY_CARE_PROVIDER_SITE_OTHER): Payer: PRIVATE HEALTH INSURANCE | Admitting: Obstetrics & Gynecology

## 2018-07-20 ENCOUNTER — Encounter: Payer: Self-pay | Admitting: Obstetrics & Gynecology

## 2018-07-20 VITALS — BP 132/96 | HR 64 | Resp 16 | Ht 65.5 in | Wt 138.6 lb

## 2018-07-20 DIAGNOSIS — Z01419 Encounter for gynecological examination (general) (routine) without abnormal findings: Secondary | ICD-10-CM | POA: Diagnosis not present

## 2018-07-20 DIAGNOSIS — N898 Other specified noninflammatory disorders of vagina: Secondary | ICD-10-CM | POA: Diagnosis not present

## 2018-07-20 DIAGNOSIS — N3090 Cystitis, unspecified without hematuria: Secondary | ICD-10-CM

## 2018-07-20 LAB — POCT URINALYSIS DIPSTICK
Bilirubin, UA: NEGATIVE
Glucose, UA: NEGATIVE
Ketones, UA: NEGATIVE
Nitrite, UA: NEGATIVE
PH UA: 6 (ref 5.0–8.0)
Protein, UA: NEGATIVE
RBC UA: NEGATIVE
UROBILINOGEN UA: 0.2 U/dL

## 2018-07-20 MED ORDER — ESTRADIOL 10 MCG VA TABS: ORAL_TABLET | VAGINAL | 12 refills | Status: DC

## 2018-07-20 NOTE — Progress Notes (Signed)
72 y.o. G3P3 Married White or Caucasian female here for follow up of possible UTI two weeks ago and also for annual exam.  Pt did have urine culture with complaint of her symptoms two weeks ago.  She was able to leave a urine sample prior to leaving for [place] for 10 days.  However, her urine culture was negative.  She'd already taken ciprofloxin before the culture was obtained.  She is still having some sensations of irritation.  She does have some difficulty in fully describing her symptoms.  She does have IBS and is not sure if her symptoms are related to her IBS.  Does feel her urinary urgency has resolved since taking the antibiotics.    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.  Has many meetings coming up.  Feels she has GI changes with increased stressors that occur with weeks like this.  Had a colonoscopy 11/17/14 with Dr. Henrene Pastor.  She is taking a probiotic just the last two weeks.  This has helped she thinks as well.  Energy level is the same.  Denies vaginal bleeding.  She is having a lot of vaginal irritation and dryness.  Has not been using the vagifem regularly.  Was prescribed this two times weekly.   No LMP recorded. Patient is postmenopausal.          Sexually active: Yes.    The current method of family planning is post menopausal status.    Exercising: Yes.    recombinant biking, golfing, weights Smoker:  Former smoker  Health Maintenance: Pap:  9/17 negative History of abnormal Pap:  no MMG:  [facility]/05/17 at [facility].  Reviewed in [place].   Colonoscopy:  11/17/14 by Dr. Henrene Pastor BMD:   2016.  Is planning on having another one this year or early next year with Dr. Joylene Draft TDaP:  UTD with Dr. Joylene Draft Pneumonia vaccine(s):  UTD Shingrix:   Interested in having this.  Has completed Zostavax in the past. Hep C testing: Dr. Joylene Draft does all lab work, typically   reports that she quit smoking about 39 years ago. Her smoking use included cigarettes. She has never used smokeless  tobacco. She reports that she drinks about 14.0 standard drinks of alcohol per week. She reports that she does not use drugs.  Past Medical History:  Diagnosis Date  . Cancer [facility])    Left breast  . Diverticulosis of colon (without mention of hemorrhage)   . Elevated LFTs   . History of breast cancer 1995  . Hypertension 2012   newly diagnosed    Past Surgical History:  Procedure Laterality Date  . CHOLECYSTECTOMY  09/15/2012   Procedure: LAPAROSCOPIC CHOLECYSTECTOMY WITH INTRAOPERATIVE CHOLANGIOGRAM;  Surgeon: Haywood Lasso, MD;  Location: [place];  Service: General;  Laterality: N/A;  . [place]   left, followed by reconstruction     Current Outpatient Medications  Medication Sig Dispense Refill  . ALPRAZolam (XANAX) 0.5 MG tablet TAKE ONE-HALF TABLET AT BEDTIME AS NEEDED 30 tablet 0  . Ascorbic Acid (VITAMIN C) 1000 MG tablet Take 1,000 mg by mouth daily.    . B Complex Vitamins (B COMPLEX PO) Take by mouth.    . bifidobacterium infantis (ALIGN) capsule Take by mouth daily.    Marland Kitchen BYSTOLIC 5 MG tablet Take 2.5 mg by mouth at bedtime.     . Cyanocobalamin (NASCOBAL) 500 MCG/0.1ML SOLN Place 0.1 mLs into the nose once a week. Spray in nasal weekly- Wednesday  1.3 mL 3  . Estradiol 10 MCG TABS vaginal tablet 1 pv nightly x 14 days and then twice weekly. 28 tablet 12  . rosuvastatin (CRESTOR) 5 MG tablet Take 1 tablet by mouth daily.     No current facility-administered medications for this visit.     Family History  Problem Relation Age of Onset  . Breast cancer Mother 17  . Heart disease Father        2 MIs   . Colon cancer Neg Hx   . Stomach cancer Neg Hx     Review of Systems  Gastrointestinal: Abdominal pain: discomfort, no actual pain.  Genitourinary: Positive for urgency.  All other systems reviewed and are negative.   Exam:   BP (!) 132/96 (BP Location: Right Arm, Patient Position: Sitting, Cuff Size: Normal)   Pulse 64   Resp 16   Ht 5' 5.5" (1.664  m)   Wt 138 lb 9.6 oz (62.9 kg)   BMI 22.71 kg/m    Height: 5' 5.5" (166.4 cm)  Ht Readings from Last 3 Encounters:  07/20/18 5' 5.5" (1.664 m)  05/19/17 5' 5.5" (1.664 m)  05/17/16 5' 5.25" (1.657 m)    General appearance: alert, cooperative and appears stated age Head: Normocephalic, without obvious abnormality, atraumatic Neck: no adenopathy, supple, symmetrical, trachea midline and thyroid normal to inspection and palpation Lungs: clear to auscultation bilaterally Breasts: normal appearance, no masses or tenderness, bilateral implants present Heart: regular rate and rhythm Abdomen: soft, non-tender; bowel sounds normal; no masses,  no organomegaly Extremities: extremities normal, atraumatic, no cyanosis or edema Skin: Skin color, texture, turgor normal. No rashes or lesions Lymph nodes: Cervical, supraclavicular, and axillary nodes normal. No abnormal inguinal nodes palpated Neurologic: Grossly normal   Pelvic: External genitalia:  no lesions              Urethra:  normal appearing urethra with no masses, tenderness or lesions              Bartholins and Skenes: normal                 Vagina: normal appearing vagina with normal color and discharge, no lesions              Cervix: no lesions              Pap taken: No. Bimanual Exam:  Uterus:  normal size, contour, position, consistency, mobility, non-tender              Adnexa: normal adnexa and no mass, fullness, tenderness               Rectovaginal: Confirms               Anus:  normal sphincter tone, no lesions  Chaperone was present for exam.  A:  Well Woman with normal exam PMP, no HRT H/o breast cancer s/p mastectomy 1995.  Does yearly MMG at [facility] Bladder pressure, h/o urinary urgency, pelvic discomfort   P:   Mammogram guidelines reviewed pap smear not indicated Urine culture pending and affirm testing obtained today Will try vagifem 39meq pv nightly x 14 nights and then back to twice weekly.  May need  additional follow-up depending on results. Colonoscopy is UTD BMD will be scheduled with Dr. Silvestre Mesi office after her upcoming appt. return annually or prn

## 2018-07-21 LAB — URINE CULTURE

## 2018-07-21 LAB — VAGINITIS/VAGINOSIS, DNA PROBE
Candida Species: NEGATIVE
Gardnerella vaginalis: POSITIVE — AB
TRICHOMONAS VAG: NEGATIVE

## 2018-07-23 ENCOUNTER — Telehealth: Payer: Self-pay

## 2018-07-23 MED ORDER — METRONIDAZOLE 0.75 % VA GEL: VAGINAL | 0 refills | Status: DC

## 2018-07-23 NOTE — Telephone Encounter (Signed)
-----   Message from Megan Salon, MD sent at 07/22/2018  6:07 AM EST ----- Please let pt know her vaginitis testing showed BV.  Ok to treat with Metrogel 1.63%, one applicator QHS x 5 nights OR flagyl 500mg  bid x 7 days.  If pt chooses oral medication, please advise no ETOH while on medication.  Urine culture was negative.  Should plan follow up after she's completed the treatment. If chooses the vaginal treatment, she should stop the vagifem until that is completed.  Plan follow up in 2 weeks.

## 2018-07-23 NOTE — Telephone Encounter (Signed)
Called patient and advised her of results per Dr. Ammie Ferrier note below. Metrogel sent to pharmacy. Follow up OV 12/6/ at  4:15.

## 2018-07-24 ENCOUNTER — Ambulatory Visit: Payer: PRIVATE HEALTH INSURANCE | Admitting: Obstetrics & Gynecology

## 2018-08-07 ENCOUNTER — Ambulatory Visit (INDEPENDENT_AMBULATORY_CARE_PROVIDER_SITE_OTHER): Payer: No Typology Code available for payment source | Admitting: Obstetrics & Gynecology

## 2018-08-07 ENCOUNTER — Encounter: Payer: Self-pay | Admitting: Obstetrics & Gynecology

## 2018-08-07 VITALS — BP 132/96 | HR 64 | Resp 16 | Ht 65.5 in | Wt 141.4 lb

## 2018-08-07 DIAGNOSIS — B9689 Other specified bacterial agents as the cause of diseases classified elsewhere: Secondary | ICD-10-CM | POA: Diagnosis not present

## 2018-08-07 DIAGNOSIS — N760 Acute vaginitis: Secondary | ICD-10-CM

## 2018-08-07 NOTE — Progress Notes (Signed)
GYNECOLOGY  VISIT  CC:   Follow up vaginitis   HPI: 72 y.o. G3P3 Married White or Caucasian female here for follow up pelvic discomfort.  Doing well.  Symptoms fully resolved after treatment with metrogel.  This was given for +BV treatment.  As her symptoms were vague, I felt recheck with repeat testing was appropriate.  Denies any urinary symptoms.  Pelvic discomfort is resolved.  Denies any issues with vaginal bleeding or vaginal discharge.  Did not start the vagifem nightly as we originally discussed.  Would like directions about this at this time.  GYNECOLOGIC HISTORY: No LMP recorded. Patient is postmenopausal. Contraception: PMP Menopausal hormone therapy: none  Patient Active Problem List   Diagnosis Date Noted  . Dryness of vagina 01/04/2014  . Essential hypertension 12/13/2010  . Chest pain 11/30/2010  . HYPERLIPIDEMIA 06/23/2006  . BREAST CANCER, HX OF 06/23/2006    Past Medical History:  Diagnosis Date  . Cancer [facility])    Left breast  . Diverticulosis of colon (without mention of hemorrhage)   . Elevated LFTs   . History of breast cancer 1995  . Hypertension 2012   newly diagnosed    Past Surgical History:  Procedure Laterality Date  . CHOLECYSTECTOMY  09/15/2012   Procedure: LAPAROSCOPIC CHOLECYSTECTOMY WITH INTRAOPERATIVE CHOLANGIOGRAM;  Surgeon: Haywood Lasso, MD;  Location: [place];  Service: General;  Laterality: N/A;  . [place]   left, followed by reconstruction     MEDS:   Current Outpatient Medications on File Prior to Visit  Medication Sig Dispense Refill  . ALPRAZolam (XANAX) 0.5 MG tablet TAKE ONE-HALF TABLET AT BEDTIME AS NEEDED 30 tablet 0  . Ascorbic Acid (VITAMIN C) 1000 MG tablet Take 1,000 mg by mouth daily.    . B Complex Vitamins (B COMPLEX PO) Take by mouth.    . bifidobacterium infantis (ALIGN) capsule Take by mouth daily.    Marland Kitchen BYSTOLIC 5 MG tablet Take 2.5 mg by mouth at bedtime.     . Cyanocobalamin (NASCOBAL) 500 MCG/0.1ML  SOLN Place 0.1 mLs into the nose once a week. Spray in nasal weekly- Wednesday 1.3 mL 3  . Estradiol 10 MCG TABS vaginal tablet 1 pv nightly x 14 days and then twice weekly. 28 tablet 12  . rosuvastatin (CRESTOR) 5 MG tablet Take 1 tablet by mouth daily.     No current facility-administered medications on file prior to visit.     ALLERGIES: Patient has no known allergies.  Family History  Problem Relation Age of Onset  . Breast cancer Mother 26  . Heart disease Father        2 MIs   . Colon cancer Neg Hx   . Stomach cancer Neg Hx     SH:  Married, non smoker  Review of Systems  All other systems reviewed and are negative.   PHYSICAL EXAMINATION:    BP (!) 132/96 (BP Location: Right Arm, Patient Position: Sitting, Cuff Size: Normal)   Pulse 64   Resp 16   Ht 5' 5.5" (1.664 m)   Wt 141 lb 6.4 oz (64.1 kg)   BMI 23.17 kg/m     General appearance: alert, cooperative and appears stated age Abdomen: soft, non-tender; bowel sounds normal; no masses,  no organomegaly Lymph:  no inguinal LAD noted  Pelvic: External genitalia:  no lesions              Urethra:  normal appearing urethra with no masses, tenderness or lesions  Bartholins and Skenes: normal                 Vagina: normal appearing vagina with normal color and discharge, no lesions but atrophic in appearance              Cervix: no lesions              Bimanual Exam:  Uterus:  normal size, contour, position, consistency, mobility, non-tender              Adnexa: no mass, fullness, tenderness  Chaperone was present for exam.  Assessment: BV s/p treatment and resolution of symptoms H/o vaginal atrophic changes  Plan: Nuswab vaginitis testing obtained today.  If negative, she will resume using the vagifem one time nightly as she has in the past.  Has Rx.

## 2018-08-09 LAB — NUSWAB BV AND CANDIDA, NAA
Candida albicans, NAA: NEGATIVE
Candida glabrata, NAA: NEGATIVE

## 2018-10-19 ENCOUNTER — Telehealth: Payer: Self-pay | Admitting: Obstetrics & Gynecology

## 2018-10-19 ENCOUNTER — Ambulatory Visit: Payer: No Typology Code available for payment source | Admitting: Obstetrics and Gynecology

## 2018-10-19 ENCOUNTER — Encounter: Payer: Self-pay | Admitting: Obstetrics and Gynecology

## 2018-10-19 VITALS — BP 122/88 | HR 76 | Temp 97.5°F | Resp 12 | Ht 65.5 in | Wt 139.0 lb

## 2018-10-19 DIAGNOSIS — R350 Frequency of micturition: Secondary | ICD-10-CM | POA: Diagnosis not present

## 2018-10-19 DIAGNOSIS — R102 Pelvic and perineal pain: Secondary | ICD-10-CM

## 2018-10-19 LAB — POCT URINALYSIS DIPSTICK
Bilirubin, UA: NEGATIVE
Blood, UA: NEGATIVE
Glucose, UA: NEGATIVE
Ketones, UA: NEGATIVE
Leukocytes, UA: NEGATIVE
Nitrite, UA: NEGATIVE
Protein, UA: NEGATIVE
Urobilinogen, UA: 0.2 E.U./dL
pH, UA: 5 (ref 5.0–8.0)

## 2018-10-19 NOTE — Telephone Encounter (Signed)
Left message to call Andera Cranmer, RN at [facility] [phone].   

## 2018-10-19 NOTE — Telephone Encounter (Signed)
Patient called requesting to be worked in this afternoon for a appointment. She said she thinks she may have a yeast infection after taking two rounds of antibiotics. Patient is traveling back from [place] to arrive in [place] "hopefully around 1:30."

## 2018-10-19 NOTE — Progress Notes (Signed)
GYNECOLOGY  VISIT   HPI: 73 y.o.   Married  Caucasian  female   G3P3 with No LMP recorded. Patient is postmenopausal.   here for possible yeast infection. Patient states that she has been having recurring UTIs.  Just back from [place].   Had a UTI in November, treated with Bactrim by her PCP.  Symptoms returned in December.   Having symptoms of urinary frequency, urgency, and a dull ache. Tried to see a urologist but unable to see someone for weeks.  Called her PCP and was treated for UTI.  Ultimately saw urology and had a CT scan done and had a negative UC.  She was placed on an abx for one month.  She had a recheck after one month and her urine was negative.   Now she may see Dr. Matilde Sprang for possible interstitial cystitis.   She states had a pelvic ultrasound in [place] showing a potential polyp in her uterus. She denies vaginal bleeding.   Treated for BV with Metrogel by Dr. [name] Heck in November, 2019.  Denies vaginal discharge or odor.   Not having intercourse due to dryness.  Using estradiol vaginally twice weekly.   She has an appointment to see Dr. Cecille Po at [facility] this week. 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   Urine: negative  GYNECOLOGIC HISTORY: No LMP recorded. Patient is postmenopausal. Contraception:  Postmenopausal Menopausal hormone therapy:  none Last mammogram: 09-05-17 Hx.Lt.Br.mastectomy/rt.Br.Neg/BiRads1 Last pap smear: 05-17-16 Neg        OB History    Gravida  3   Para  3   Term      Preterm      AB      Living  3     SAB      TAB      Ectopic      Multiple      Live Births                 Patient Active Problem List   Diagnosis Date Noted  . Dryness of vagina 01/04/2014  . Essential hypertension 12/13/2010  . Chest pain 11/30/2010  . HYPERLIPIDEMIA 06/23/2006  . BREAST CANCER, HX OF 06/23/2006    Past Medical History:  Diagnosis Date  . Cancer [facility])    Left breast  . Diverticulosis of colon  (without mention of hemorrhage)   . Elevated LFTs   . History of breast cancer 1995  . Hypertension 2012   newly diagnosed    Past Surgical History:  Procedure Laterality Date  . CHOLECYSTECTOMY  09/15/2012   Procedure: LAPAROSCOPIC CHOLECYSTECTOMY WITH INTRAOPERATIVE CHOLANGIOGRAM;  Surgeon: Haywood Lasso, MD;  Location: [place];  Service: General;  Laterality: N/A;  . [place]   left, followed by reconstruction     Current Outpatient Medications  Medication Sig Dispense Refill  . ALPRAZolam (XANAX) 0.5 MG tablet TAKE ONE-HALF TABLET AT BEDTIME AS NEEDED 30 tablet 0  . Ascorbic Acid (VITAMIN C) 1000 MG tablet Take 1,000 mg by mouth daily.    . B Complex Vitamins (B COMPLEX PO) Take by mouth.    . bifidobacterium infantis (ALIGN) capsule Take by mouth daily.    Marland Kitchen BYSTOLIC 5 MG tablet Take 2.5 mg by mouth at bedtime.     . Cyanocobalamin (NASCOBAL) 500 MCG/0.1ML SOLN Place 0.1 mLs into the nose once a week. Spray in nasal weekly- Wednesday 1.3 mL 3  . Estradiol 10 MCG TABS vaginal tablet 1  pv nightly x 14 days and then twice weekly. 28 tablet 12  . rosuvastatin (CRESTOR) 5 MG tablet Take 1 tablet by mouth daily.     No current facility-administered medications for this visit.      ALLERGIES: Patient has no known allergies.  Family History  Problem Relation Age of Onset  . Breast cancer Mother 68  . Heart disease Father        2 MIs   . Colon cancer Neg Hx   . Stomach cancer Neg Hx     Social History   Socioeconomic History  . Marital status: Married    Spouse name: Not on file  . Number of children: Not on file  . Years of education: Not on file  . Highest education level: Not on file  Occupational History  . Not on file  Social Needs  . Financial resource strain: Not on file  . Food insecurity:    Worry: Not on file    Inability: Not on file  . Transportation needs:    Medical: Not on file    Non-medical: Not on file  Tobacco Use  . Smoking status:  Former Smoker    Types: Cigarettes    Last attempt to quit: 09/02/1978    Years since quitting: 40.1  . Smokeless tobacco: Never Used  Substance and Sexual Activity  . Alcohol use: Yes    Alcohol/week: 14.0 standard drinks    Types: 14 Glasses of wine per week    Comment: 2  0.4 ounces glass red OR WHITE wine per day  . Drug use: No  . Sexual activity: Yes    Partners: Male    Birth control/protection: Post-menopausal  Lifestyle  . Physical activity:    Days per week: Not on file    Minutes per session: Not on file  . Stress: Not on file  Relationships  . Social connections:    Talks on phone: Not on file    Gets together: Not on file    Attends religious service: Not on file    Active member of club or organization: Not on file    Attends meetings of clubs or organizations: Not on file    Relationship status: Not on file  . Intimate partner violence:    Fear of current or ex partner: Not on file    Emotionally abused: Not on file    Physically abused: Not on file    Forced sexual activity: Not on file  Other Topics Concern  . Not on file  Social History Narrative  . Not on file    Review of Systems  Constitutional: Negative.   HENT: Negative.   Eyes: Negative.   Respiratory: Negative.   Cardiovascular: Negative.   Gastrointestinal:       Dull pain in lower abdomen   Endocrine: Negative.   Genitourinary: Positive for frequency and urgency.  Musculoskeletal: Negative.   Skin: Negative.   Allergic/Immunologic: Negative.   Neurological: Negative.   Hematological: Negative.   Psychiatric/Behavioral: Negative.     PHYSICAL EXAMINATION:    BP 122/88 (BP Location: Right Arm, Patient Position: Sitting, Cuff Size: Normal)   Pulse 76   Temp (!) 97.5 F (36.4 C) (Oral)   Resp 12   Ht 5' 5.5" (1.664 m)   Wt 139 lb (63 kg)   BMI 22.78 kg/m     General appearance: alert, cooperative and appears stated age.  Anxious.  Pelvic: External genitalia:  no lesions  Urethra:  normal appearing urethra with no masses, tenderness or lesions              Bartholins and Skenes: normal                 Vagina: normal appearing vagina with normal color and discharge, no lesions              Cervix: no lesions                Bimanual Exam:  Uterus:  normal size, contour, position, consistency, mobility, non-tender              Adnexa: no mass, fullness, tenderness             Chaperone was present for exam.  ASSESSMENT  Urinary frequency and urgency.  Pelvic discomfort. Possible uterine polyp. Recent bacterial vaginosis.   PLAN  Patient may have overactive bladder or interstitial cystitis, so follow up with Dr. Matilde Sprang is recommended.  Will check Affirm.  I recommend she return for pelvic ultrasound, possible sonohysterogram and endometrial biopsy with Dr. [name] Heck.   We reviewed the evaluation of endometrial masses and I explained that this is a common procedure for our office and providers.  I mentioned hysteroscopy and dilation and curettage for removal of endometrial masses and polyps is a routine procedure for a benign gynecologist.    An After Visit Summary was printed and given to the patient.  __25____ minutes face to face time of which over 50% was spent in counseling.

## 2018-10-19 NOTE — Telephone Encounter (Signed)
Spoke with patient. Patient states she is on a plane returning to [place] and can not discuss in detail. Has been on abx for 6wks for UTI, will be seeing Dr. Matilde Sprang. Requesting OV this afternoon with Dr. [name] Heck "to make sure I am not dealing with other issues". Advised patient Dr. [name] Heck is out of the office this afternoon, offered 2/18 OV with Dr. [name] Heck, patient declined. Patient request OV today with first avaiable provider, OV scheduled for today at 3:45pm with Dr. Quincy Simmonds.   Routing to provider for final review. Patient is agreeable to disposition. Will close encounter.  Cc: Dr. Quincy Simmonds

## 2018-10-20 ENCOUNTER — Encounter: Payer: Self-pay | Admitting: Obstetrics & Gynecology

## 2018-10-20 ENCOUNTER — Ambulatory Visit: Payer: No Typology Code available for payment source | Admitting: Obstetrics & Gynecology

## 2018-10-20 ENCOUNTER — Other Ambulatory Visit: Payer: Self-pay | Admitting: Obstetrics & Gynecology

## 2018-10-20 ENCOUNTER — Ambulatory Visit (INDEPENDENT_AMBULATORY_CARE_PROVIDER_SITE_OTHER): Payer: No Typology Code available for payment source

## 2018-10-20 VITALS — BP 136/80 | HR 68 | Resp 14 | Ht 65.5 in | Wt 139.0 lb

## 2018-10-20 DIAGNOSIS — N9489 Other specified conditions associated with female genital organs and menstrual cycle: Secondary | ICD-10-CM | POA: Diagnosis not present

## 2018-10-20 DIAGNOSIS — R102 Pelvic and perineal pain: Secondary | ICD-10-CM | POA: Diagnosis not present

## 2018-10-20 LAB — VAGINITIS/VAGINOSIS, DNA PROBE
Candida Species: NEGATIVE
Gardnerella vaginalis: NEGATIVE
Trichomonas vaginosis: NEGATIVE

## 2018-10-20 NOTE — Progress Notes (Signed)
73 y.o. G3P3 Married White or Caucasian female here for pelvic ultrasound due to being advised in [place] that she may have an endometrial polyp.  Reports her pelvic pressure symptoms were much improved by following I.C. diet for elimination of high risk foods but then had a couple of glasses of red wine and began to have symptoms again.  She is seeing urology tomorrow.    Denies vaginal bleeding.  No LMP recorded. Patient is postmenopausal.  Contraception: PMP  Findings:  UTERUS: 6.5 x 4.5 x 2.3cm EMS:1.47mm ADNEXA: Left ovary:  1.5 x 0.7 x 0.6cm       Right ovary:  1.3 x 0.5 x 0.4cm CUL DE SAC: no free fluid  Discussion:  Findings reviewed.  There is fluid within the endometrial cavity making visualization of the endometrium very easy.  Tere is a 75mm avascular irregularity along the endometrial edge.  Maybe this is a tiny polyp.  There is no increased vascular flow to this.  I think this is so very tiny that the risks of a hysteroscopy (uterine perforation) are higher than the risk of this being an abnormal polyp.  We discussed repeating this imaging in 3-4 months to watch for change.  She is comfortable with this.    Assessment:  Pelvic pressure 17mm endometrial irregularity that really does not look like a polyp  Plan:  Repeat PUS 3 months Seeing urology tomorrow.  ~15 minutes spent with patient >50% of time was in face to face discussion of above.

## 2018-10-21 ENCOUNTER — Telehealth: Payer: Self-pay | Admitting: Obstetrics & Gynecology

## 2018-10-21 NOTE — Telephone Encounter (Signed)
Spoke with patient, advised Dr. [name] Heck is out of the office, will review Rx when she returns on 2/20 and our office will return call. Patient states she is leaving out of town for a month on 2/22, will need to pick up Rx on 2/21. Advised I will update Dr. [name] Heck and notify once Rx has been sent. Advised to allow 48hrs for pharmacy to prepare medication once Rx received. Patient verbalizes understanding.   Routing to Dr. [name] Heck to advise on Rx.

## 2018-10-21 NOTE — Telephone Encounter (Signed)
Patient is inquiring about prescription for a vaginal Vitamin E cream that Dr. [name] Heck was to call in for her on 10/20/2018 to [place] [facility].

## 2018-10-22 ENCOUNTER — Other Ambulatory Visit: Payer: No Typology Code available for payment source

## 2018-10-22 ENCOUNTER — Other Ambulatory Visit: Payer: No Typology Code available for payment source | Admitting: Obstetrics & Gynecology

## 2018-10-22 MED ORDER — NONFORMULARY OR COMPOUNDED ITEM: 3 refills | Status: DC

## 2018-10-22 NOTE — Telephone Encounter (Signed)
Rx already faxed.  They will call her when ready.

## 2018-10-22 NOTE — Telephone Encounter (Signed)
Patient notified of RX. Patient verbalizes understanding and is agreeable.  Encounter closed.

## 2018-11-20 ENCOUNTER — Ambulatory Visit: Payer: Self-pay | Admitting: Obstetrics & Gynecology

## 2018-12-01 ENCOUNTER — Ambulatory Visit (INDEPENDENT_AMBULATORY_CARE_PROVIDER_SITE_OTHER): Payer: No Typology Code available for payment source | Admitting: Family Medicine

## 2018-12-01 ENCOUNTER — Other Ambulatory Visit: Payer: Self-pay

## 2018-12-01 ENCOUNTER — Encounter (INDEPENDENT_AMBULATORY_CARE_PROVIDER_SITE_OTHER): Payer: Self-pay | Admitting: Family Medicine

## 2018-12-01 DIAGNOSIS — M25552 Pain in left hip: Secondary | ICD-10-CM

## 2018-12-01 MED ORDER — METHYLPREDNISOLONE ACETATE 40 MG/ML IJ SUSP: 40.0000 mg | Freq: Once | INTRAMUSCULAR | Status: DC

## 2018-12-01 NOTE — Progress Notes (Signed)
Office Visit Note   Patient: Patricia Flynn           Date of Birth: Jul 11, 1946           MRN: [id number] Visit Date: 12/01/2018 Requested by: Crist Infante, MD [address] [place], [place] [address] PCP: Crist Infante, MD  Subjective: Chief Complaint  Patient presents with  . Left Hip - Pain    Lateral hip pain that radiates down the leg to the knee x 6 weeks.    HPI: She is here with left lateral hip pain.  Symptoms started a couple months ago, no injury.  She has had this problem in the past and did well with cortisone injection, has not had pain for years.  Denies any groin pain, denies any low back pain.  She gets occasional radiation down the side of her thigh to her knee.  She is doing stretches with some improvement.  It bothers her especially at night when trying to sleep.              ROS: No fevers or chills.  All other systems were reviewed and are negative.  Objective: Vital Signs: There were no vitals taken for this visit.  Physical Exam:  General:  Alert and oriented, in no acute distress. Pulm:  Breathing unlabored. Psy:  Normal mood, congruent affect. Skin: No rash on her skin. Left hip: Negative straight leg raise, lower extremity strength and reflexes are normal.  Good range of motion with passive rotation of her hip with no pain.  She is tender over the greater trochanter and this seems to reproduce her pain.  Imaging: None today.  Assessment & Plan: 1.  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  She will be lateral leg raises for strengthening.  Follow-up as needed.     Procedures: Left hip greater trochanter injection: After sterile prep with Betadine, injected 5 cc 1% lidocaine without epinephrine and 40 mg methylprednisolone using a 22-gauge spinal needle into the area of maximum tenderness.  She had good pain relief during the immediate anesthetic phase.   PMFS History:  Patient Active Problem List   Diagnosis Date Noted  . Dryness of vagina 01/04/2014  . Essential hypertension 12/13/2010  . Chest pain 11/30/2010  . HYPERLIPIDEMIA 06/23/2006  . BREAST CANCER, HX OF 06/23/2006   Past Medical History:  Diagnosis Date  . Cancer [facility])    Left breast  . Diverticulosis of colon (without mention of hemorrhage)   . Elevated LFTs   . History of breast cancer 1995  . Hypertension 2012   newly diagnosed    Family History  Problem Relation Age of Onset  . Breast cancer Mother 73  . Heart disease Father        2 MIs   . Colon cancer Neg Hx   . Stomach cancer Neg Hx     Past Surgical History:  Procedure Laterality Date  . CHOLECYSTECTOMY  09/15/2012   Procedure: LAPAROSCOPIC CHOLECYSTECTOMY WITH INTRAOPERATIVE CHOLANGIOGRAM;  Surgeon: Haywood Lasso, MD;  Location: [place];  Service: General;  Laterality: N/A;  . [place]   left, followed by reconstruction    Social History   Occupational History  . Not on file  Tobacco Use  . Smoking status: Former Smoker    Types: Cigarettes    Last attempt to quit: 09/02/1978    Years since quitting: 40.2  . Smokeless tobacco: Never Used  Substance and Sexual Activity  . Alcohol use: Yes    Alcohol/week: 14.0 standard drinks    Types: 14 Glasses of wine per week    Comment: 2  0.4 ounces glass red OR WHITE wine per day  . Drug use: No  . Sexual activity: Yes    Partners: Male    Birth control/protection: Post-menopausal

## 2019-02-09 ENCOUNTER — Encounter: Payer: Self-pay | Admitting: Family Medicine

## 2019-02-09 ENCOUNTER — Other Ambulatory Visit: Payer: Self-pay

## 2019-02-09 ENCOUNTER — Ambulatory Visit (INDEPENDENT_AMBULATORY_CARE_PROVIDER_SITE_OTHER): Payer: PRIVATE HEALTH INSURANCE | Admitting: Family Medicine

## 2019-02-09 DIAGNOSIS — M25562 Pain in left knee: Secondary | ICD-10-CM | POA: Diagnosis not present

## 2019-02-09 DIAGNOSIS — M25561 Pain in right knee: Secondary | ICD-10-CM | POA: Diagnosis not present

## 2019-02-09 NOTE — Progress Notes (Addendum)
Office Visit Note   Patient: Patricia Flynn           Date of Birth: 06-01-1946           MRN: [id number] Visit Date: 02/09/2019 Requested by: Crist Infante, MD [address] [place], [place] [address] PCP: Crist Infante, MD  Subjective: Chief Complaint  Patient presents with  . bil knee pain x 6-8 wks, rt>lt    HPI: She is here with right greater than left knee pain.  About 6 or 8 weeks ago she was doing a lot of cleaning around her house, bending and kneeling and stooping.  After a while she noticed both knees were hurting.  She been easy for a few weeks and her right knee is better but still feels tight when she bends it.  Her left knee feels almost normal.  She is not taking medications.              ROS: No fevers or chills.  All other systems were reviewed and are negative.  Objective: Vital Signs: There were no vitals taken for this visit.  Physical Exam:  General:  Alert and oriented, in no acute distress. Pulm:  Breathing unlabored. Psy:  Normal mood, congruent affect. Skin: No rash on her skin. 1+ effusion on the right, none on the left.  No warmth or erythema.  Full range of motion of both knees with very minimal patellofemoral crepitus.  No joint line tenderness, no pain or click with McMurray's.  Imaging: None today.  Assessment & Plan: 1.  Improving right greater than left knee pain, suspect aggravation of mild DJD. -Trial of topical diclofenac.  X-rays if symptoms worsen.     Procedures: No procedures performed  No notes on file     PMFS History: Patient Active Problem List   Diagnosis Date Noted  . Dryness of vagina 01/04/2014  . Essential hypertension 12/13/2010  . Chest pain 11/30/2010  . HYPERLIPIDEMIA 06/23/2006  . BREAST CANCER, HX OF 06/23/2006   Past Medical History:  Diagnosis Date  . Cancer [facility])    Left breast  . Diverticulosis of colon (without mention of hemorrhage)   . Elevated LFTs   . History of breast cancer 1995  .  Hypertension 2012   newly diagnosed    Family History  Problem Relation Age of Onset  . Breast cancer Mother 67  . Heart disease Father        2 MIs   . Colon cancer Neg Hx   . Stomach cancer Neg Hx     Past Surgical History:  Procedure Laterality Date  . CHOLECYSTECTOMY  09/15/2012   Procedure: LAPAROSCOPIC CHOLECYSTECTOMY WITH INTRAOPERATIVE CHOLANGIOGRAM;  Surgeon: Haywood Lasso, MD;  Location: [place];  Service: General;  Laterality: N/A;  . [place]   left, followed by reconstruction    Social History   Occupational History  . Not on file  Tobacco Use  . Smoking status: Former Smoker    Types: Cigarettes    Last attempt to quit: 09/02/1978    Years since quitting: 40.4  . Smokeless tobacco: Never Used  Substance and Sexual Activity  . Alcohol use: Yes    Alcohol/week: 14.0 standard drinks    Types: 14 Glasses of wine per week    Comment: 2  0.4 ounces glass red OR WHITE wine per day  . Drug use: No  . Sexual activity: Yes    Partners: Male    Birth  control/protection: Post-menopausal

## 2019-02-09 NOTE — Patient Instructions (Signed)
   Try Voltaren Gel 4 times daily as needed.

## 2019-02-18 ENCOUNTER — Telehealth: Payer: Self-pay | Admitting: Obstetrics & Gynecology

## 2019-02-18 NOTE — Telephone Encounter (Signed)
Call placed to patient to convey benefits for scheduled ultrasound appointment. Left voicemail message requesting a return call

## 2019-02-22 ENCOUNTER — Other Ambulatory Visit: Payer: Self-pay

## 2019-02-22 DIAGNOSIS — Z9012 Acquired absence of left breast and nipple: Secondary | ICD-10-CM | POA: Insufficient documentation

## 2019-02-22 NOTE — Telephone Encounter (Signed)
Patient is wanting to know if there is any appointments earlier for ultrasound on Tuesday. Cell [phone]

## 2019-02-22 NOTE — Telephone Encounter (Signed)
Routing to [employer] for return call.

## 2019-02-22 NOTE — Telephone Encounter (Signed)
Returning a call to [place]. She asked that you please call her mobile number. [phone].

## 2019-02-22 NOTE — Telephone Encounter (Signed)
Returned call to patient to review scheduling options and benefits. Left voicemail message requesting a return call

## 2019-02-23 ENCOUNTER — Encounter: Payer: Self-pay | Admitting: Obstetrics & Gynecology

## 2019-02-23 ENCOUNTER — Other Ambulatory Visit: Payer: Self-pay | Admitting: Obstetrics & Gynecology

## 2019-02-23 ENCOUNTER — Ambulatory Visit (INDEPENDENT_AMBULATORY_CARE_PROVIDER_SITE_OTHER): Payer: No Typology Code available for payment source

## 2019-02-23 ENCOUNTER — Ambulatory Visit: Payer: No Typology Code available for payment source | Admitting: Obstetrics & Gynecology

## 2019-02-23 VITALS — BP 120/82 | HR 64 | Temp 97.3°F | Ht 65.5 in | Wt 143.0 lb

## 2019-02-23 DIAGNOSIS — N9489 Other specified conditions associated with female genital organs and menstrual cycle: Secondary | ICD-10-CM | POA: Diagnosis not present

## 2019-02-23 DIAGNOSIS — N859 Noninflammatory disorder of uterus, unspecified: Secondary | ICD-10-CM

## 2019-02-23 NOTE — Telephone Encounter (Signed)
Patient returning call to [facility]. Can be reached on her mobile [phone].

## 2019-02-23 NOTE — Progress Notes (Signed)
73 y.o. G3P3 Married White or Caucasian female here for pelvic ultrasound due to 1-32mm filling defect within the endometrial cavity.  She does have a hx of endometrial fluid present as well.  Denies pelvic pain or pressure.  Denies vaginal bleeding.  No LMP recorded. Patient is postmenopausal.  Contraception: PMP  Findings:  UTERUS: 6.3 x 3.6 x 3.0cm EMS: 1.61mm, endometrial fluid and endocervical fluid noted ADNEXA: Left ovary: 2.1 x 0.7 x 0.7cm       Right ovary: 2.1 x 0.9 x 1.0cm, both ovaries are atrophic in appearnace CUL DE SAC: no free fluid   Discussion:  Findings and images reviewed with pt.  Amount of fluid within endometrial cavity is the same.  Small 1.3 - 1.22mm filling defect noted that is avascular.  This is unchanged.  Ovaries appear normal.  Feel should recheck in about 6 months to ensure stability over a year.  If no change, consider repeating in 1 year.  Pt comfortable with plan.  Assessment:  Endometrial bluid with small endometrial filling defect  Plan:  Repeat PUS with AEX 08/2019.  She will be called to have this scheduled.    ~15 minutes spent with patient >50% of time was in face to face discussion of above.

## 2019-02-23 NOTE — Telephone Encounter (Signed)
Returned call to patient. Patient will keep ultrasound appointment as scheduled for today, 02/23/2019. Will close encounter

## 2019-02-24 ENCOUNTER — Telehealth: Payer: Self-pay | Admitting: *Deleted

## 2019-02-24 DIAGNOSIS — N859 Noninflammatory disorder of uterus, unspecified: Secondary | ICD-10-CM

## 2019-02-24 NOTE — Telephone Encounter (Signed)
-----   Message from Megan Salon, MD sent at 02/23/2019  3:49 PM EDT ----- Regarding: appt change needed.  please help. Sharee Pimple I would like to do an AEX with a PUS after 08/08/2019 on this.  Can you scheduled these for the same day and on a Tuesday after with Ivin Booty?  Please call Mrs. Ermalinda Barrios.  Thank you.  Edwinna Areola

## 2019-02-24 NOTE — Telephone Encounter (Signed)
Left message to call Octivia Canion, RN at [facility] [phone].   

## 2019-02-25 NOTE — Telephone Encounter (Signed)
Spoke with patient. PUS with AEX scheduled for 08/17/19 at 3:30pm, consult at 4pm with Dr. [name] Heck. Patient verbalizes understanding and is agreeable.   Future PUS order placed.   Routing to provider for final review. Patient is agreeable to disposition. Will close encounter.  Cc: Lerry Liner, Magdalene Patricia

## 2019-03-30 ENCOUNTER — Other Ambulatory Visit (HOSPITAL_COMMUNITY): Payer: Self-pay | Admitting: Adult Health

## 2019-03-30 DIAGNOSIS — E785 Hyperlipidemia, unspecified: Secondary | ICD-10-CM

## 2019-03-30 DIAGNOSIS — R0789 Other chest pain: Secondary | ICD-10-CM

## 2019-03-30 DIAGNOSIS — R9431 Abnormal electrocardiogram [ECG] [EKG]: Secondary | ICD-10-CM

## 2019-04-06 ENCOUNTER — Telehealth: Payer: Self-pay | Admitting: Internal Medicine

## 2019-04-06 NOTE — Telephone Encounter (Signed)
Tell her that I am sorry she is having these difficulties.  Not surprised that her stomach is acting up.  In any event, for her stomach complaints I would like her to begin IBgard twice daily for 1 month and keep her follow-up appointment

## 2019-04-06 NOTE — Telephone Encounter (Signed)
Spoke with pt and she is aware.

## 2019-04-06 NOTE — Telephone Encounter (Signed)
Pt called and states she has has issues with acid reflux and IBS since May. Saw Dr. Abner Greenspan last week and he started her on Omeprazole 20mg  bid and told her to take this for 3 weeks. She also started culturelle daily. A few days after that visit she saw her Dermatologist and was diagnosed with shingles, she is on valtrex tid. Reports her stomach just sounds like a freight train all the time. She has given up alcohol and coffee and tomatoes. She wanted to get Dr. Blanch Media opinion on what she is taking and if she needs any changes or if he has any other recommendations. She has an OV scheduled with PA on 04/30/19. Please advise.

## 2019-04-07 ENCOUNTER — Emergency Department (HOSPITAL_COMMUNITY)
Admission: EM | Admit: 2019-04-07 | Discharge: 2019-04-08 | Disposition: A | Discharge status: Home / Self Care | Payer: No Typology Code available for payment source | Attending: Emergency Medicine | Admitting: Emergency Medicine

## 2019-04-07 ENCOUNTER — Other Ambulatory Visit: Payer: Self-pay

## 2019-04-07 ENCOUNTER — Encounter (HOSPITAL_COMMUNITY): Payer: Self-pay | Admitting: Emergency Medicine

## 2019-04-07 ENCOUNTER — Emergency Department (HOSPITAL_COMMUNITY): Payer: No Typology Code available for payment source

## 2019-04-07 DIAGNOSIS — E876 Hypokalemia: Secondary | ICD-10-CM | POA: Diagnosis not present

## 2019-04-07 DIAGNOSIS — Z87891 Personal history of nicotine dependence: Secondary | ICD-10-CM | POA: Insufficient documentation

## 2019-04-07 DIAGNOSIS — Z79899 Other long term (current) drug therapy: Secondary | ICD-10-CM | POA: Insufficient documentation

## 2019-04-07 DIAGNOSIS — I10 Essential (primary) hypertension: Secondary | ICD-10-CM | POA: Insufficient documentation

## 2019-04-07 DIAGNOSIS — R002 Palpitations: Secondary | ICD-10-CM | POA: Diagnosis present

## 2019-04-07 LAB — BASIC METABOLIC PANEL
Anion gap: 10 (ref 5–15)
BUN: 14 mg/dL (ref 8–23)
CO2: 24 mmol/L (ref 22–32)
Calcium: 9.2 mg/dL (ref 8.9–10.3)
Chloride: 107 mmol/L (ref 98–111)
Creatinine, Ser: 0.87 mg/dL (ref 0.44–1.00)
GFR calc Af Amer: >60 mL/min (ref >60)
GFR calc non Af Amer: >60 mL/min (ref >60)
Glucose, Bld: 110 mg/dL — ABNORMAL HIGH (ref 70–99)
Potassium: 3.3 mmol/L — ABNORMAL LOW (ref 3.5–5.1)
Sodium: 141 mmol/L (ref 135–145)

## 2019-04-07 LAB — CBC
HCT: 42.1 % (ref 36.0–46.0)
Hemoglobin: 13.9 g/dL (ref 12.0–15.0)
MCH: 30.7 pg (ref 26.0–34.0)
MCHC: 33 g/dL (ref 30.0–36.0)
MCV: 92.9 fL (ref 80.0–100.0)
Platelets: 172 10*3/uL (ref 150–400)
RBC: 4.53 MIL/uL (ref 3.87–5.11)
RDW: 12.2 % (ref 11.5–15.5)
WBC: 5 10*3/uL (ref 4.0–10.5)
nRBC: 0 % (ref 0.0–0.2)

## 2019-04-07 LAB — TROPONIN I (HIGH SENSITIVITY): Troponin I (High Sensitivity): 5 ng/L (ref <18)

## 2019-04-07 MED ORDER — SODIUM CHLORIDE 0.9% FLUSH
3.0000 mL | Freq: Once | INTRAVENOUS | Status: AC
  Administered 2019-04-07: 3 mL via INTRAVENOUS

## 2019-04-07 NOTE — ED Provider Notes (Signed)
[facility] DEPT Provider Note: Georgena Spurling, MD, FACEP  CSN: [id number] MRN: [id number] ARRIVAL: 04/07/19 at 2243 ROOM: [place]  Palpitations   HISTORY OF PRESENT ILLNESS  04/07/19 11:42 PM Patricia Flynn is a 73 y.o. female who was started on Prilosec twice daily for GERD about 2 weeks ago.  She is here with palpitations that began about 9 PM this evening.  They came and went for about an hour.  She describes them as being the sensation that her heart was beating rapidly.  She could not say if it felt irregular.  She has no history of the same.  There was no associated lightheadedness, diaphoresis, chest pain, shortness of breath, nausea or vomiting.  She has had no further episodes since coming to the ED.  She has been burping but this began after her palpitations resolved.  The burping is characteristic of her GERD.  She had a salad and a glass of wine for dinner tonight which is not usual for her.    Past Medical History:  Diagnosis Date  . Diverticulosis of colon (without mention of hemorrhage)   . Elevated LFTs   . History of breast cancer 1995  . Hypertension 2012    Past Surgical History:  Procedure Laterality Date  . CHOLECYSTECTOMY  09/15/2012   Procedure: LAPAROSCOPIC CHOLECYSTECTOMY WITH INTRAOPERATIVE CHOLANGIOGRAM;  Surgeon: Haywood Lasso, MD;  Location: [place];  Service: General;  Laterality: N/A;  . [place]   left, followed by reconstruction     Family History  Problem Relation Age of Onset  . Breast cancer Mother 35  . Heart disease Father        2 MIs   . Colon cancer Neg Hx   . Stomach cancer Neg Hx     Social History   Tobacco Use  . Smoking status: Former Smoker    Types: Cigarettes    Quit date: 09/02/1978    Years since quitting: 40.6  . Smokeless tobacco: Never Used  Substance Use Topics  . Alcohol use: Yes    Alcohol/week: 14.0 standard drinks    Types: 14 Glasses of wine per week    Comment: 2  0.4  ounces glass red OR WHITE wine per day  . Drug use: No    Prior to Admission medications   Medication Sig Start Date End Date Taking? Authorizing Provider  BYSTOLIC 5 MG tablet Take 2.5 mg by mouth at bedtime.  11/21/11  Yes [provider]  Estradiol 10 MCG TABS vaginal tablet 1 pv nightly x 14 days and then twice weekly. 07/20/18  Yes Megan Salon, MD  omeprazole (PRILOSEC OTC) 20 MG tablet Take 20 mg by mouth daily.   Yes [provider]  rosuvastatin (CRESTOR) 5 MG tablet Take 1 tablet by mouth daily.   Yes [provider]  NONFORMULARY OR COMPOUNDED ITEM Vitamin E vaginal suppositories 200u/ml.  One pv two to three times weekly. Patient not taking: Reported on 04/08/2019 10/22/18   Megan Salon, MD    Allergies Patient has no known allergies.   REVIEW OF SYSTEMS  Negative except as noted here or in the History of Present Illness.   PHYSICAL EXAMINATION  Initial Vital Signs Blood pressure 138/68, pulse 68, temperature 98.3 F (36.8 C), temperature source Oral, resp. rate 14, height 5\' 5"  (1.651 m), weight 61.2 kg, SpO2 98 %.  Examination General: Well-developed, well-nourished female in no acute distress; appearance consistent with age  of record HENT: normocephalic; atraumatic Eyes: pupils equal, round and reactive to light; extraocular muscles intact Neck: supple Heart: regular rate and rhythm Lungs: clear to auscultation bilaterally Abdomen: soft; nondistended; nontender; no masses or hepatosplenomegaly; bowel sounds present Extremities: No deformity; full range of motion; pulses normal Neurologic: Awake, alert and oriented; motor function intact in all extremities and symmetric; no facial droop Skin: Warm and dry Psychiatric: Normal mood and affect   RESULTS  Summary of this visit's results, reviewed by myself:   EKG Interpretation  Date/Time:  Wednesday April 07 2019 22:59:11 EDT Ventricular Rate:  69 PR Interval:    QRS Duration:  94 QT Interval:  419 QTC Calculation: 449 R Axis:   -37 Text Interpretation:  Sinus rhythm Left axis deviation No significant change was found Confirmed by Margaretha Mahan [phone]) on 04/07/2019 11:04:47 PM      Laboratory Studies: Results for orders placed or performed during the hospital encounter of 04/07/19 (from the past 24 hour(s))  Basic metabolic panel     Status: Abnormal   Collection Time: 04/07/19 11:00 PM  Result Value Ref Range   Sodium 141 135 - 145 mmol/L   Potassium 3.3 (L) 3.5 - 5.1 mmol/L   Chloride 107 98 - 111 mmol/L   CO2 24 22 - 32 mmol/L   Glucose, Bld 110 (H) 70 - 99 mg/dL   BUN 14 8 - 23 mg/dL   Creatinine, Ser 0.87 0.44 - 1.00 mg/dL   Calcium 9.2 8.9 - 10.3 mg/dL   GFR calc non Af Amer >60 >60 mL/min   GFR calc Af Amer >60 >60 mL/min   Anion gap 10 5 - 15  CBC     Status: None   Collection Time: 04/07/19 11:00 PM  Result Value Ref Range   WBC 5.0 4.0 - 10.5 K/uL   RBC 4.53 3.87 - 5.11 MIL/uL   Hemoglobin 13.9 12.0 - 15.0 g/dL   HCT 42.1 36.0 - 46.0 %   MCV 92.9 80.0 - 100.0 fL   MCH 30.7 26.0 - 34.0 pg   MCHC 33.0 30.0 - 36.0 g/dL   RDW 12.2 11.5 - 15.5 %   Platelets 172 150 - 400 K/uL   nRBC 0.0 0.0 - 0.2 %  Troponin I (High Sensitivity)     Status: None   Collection Time: 04/07/19 11:00 PM  Result Value Ref Range   Troponin I (High Sensitivity) 5 <18 ng/L  Hepatic function panel     Status: Abnormal   Collection Time: 04/07/19 11:43 PM  Result Value Ref Range   Total Protein 6.6 6.5 - 8.1 g/dL   Albumin 4.1 3.5 - 5.0 g/dL   AST 33 15 - 41 U/L   ALT 31 0 - 44 U/L   Alkaline Phosphatase 84 38 - 126 U/L   Total Bilirubin 2.0 (H) 0.3 - 1.2 mg/dL   Bilirubin, Direct 0.3 (H) 0.0 - 0.2 mg/dL   Indirect Bilirubin 1.7 (H) 0.3 - 0.9 mg/dL  Urinalysis, Routine w reflex microscopic     Status: Abnormal   Collection Time: 04/08/19 12:59 AM  Result Value Ref Range   Color, Urine STRAW (A) YELLOW   APPearance HAZY (A) CLEAR   Specific Gravity, Urine  1.002 (L) 1.005 - 1.030   pH 6.0 5.0 - 8.0   Glucose, UA NEGATIVE NEGATIVE mg/dL   Hgb urine dipstick SMALL (A) NEGATIVE   Bilirubin Urine NEGATIVE NEGATIVE   Ketones, ur NEGATIVE NEGATIVE mg/dL   Protein, ur NEGATIVE  NEGATIVE mg/dL   Nitrite NEGATIVE NEGATIVE   Leukocytes,Ua MODERATE (A) NEGATIVE   RBC / HPF 0-5 0 - 5 RBC/hpf   WBC, UA 6-10 0 - 5 WBC/hpf   Bacteria, UA RARE (A) NONE SEEN   Squamous Epithelial / LPF 0-5 0 - 5   Imaging Studies: Dg Chest 2 View  Result Date: 04/07/2019 CLINICAL DATA:  Chest pain EXAM: CHEST - 2 VIEW COMPARISON:  09/08/2012 FINDINGS: There is hyperinflation of the lungs compatible with COPD. Heart and mediastinal contours are within normal limits. No focal opacities or effusions. No acute bony abnormality. IMPRESSION: COPD.  No active disease. Electronically Signed   By: Rolm Baptise M.D.   On: 04/07/2019 23:19    ED COURSE and MDM  Nursing notes and initial vitals signs, including pulse oximetry, reviewed.  Vitals:   04/07/19 2259 04/08/19 0030 04/08/19 0100  BP: 138/68 (!) 137/92 (!) 139/108  Pulse: 68 62 64  Resp: 14 11 15   Temp: 98.3 F (36.8 C)    TempSrc: Oral    SpO2: 98% 97% 98%  Weight: 61.2 kg    Height: 5\' 5"  (1.651 m)     1:49 AM Patient has been asymptomatic while in the ED.  Her rhythm strip shows normal sinus rhythm without ectopy or arrhythmia.  A TSH is pending but she was advised to have her PCP, Dr. Joylene Draft, follow-up on this as it has not resulted.  She was advised to return if symptoms recur.  PROCEDURES    ED DIAGNOSES     ICD-10-CM   1. Palpitations  R00.2   2. Hypokalemia  E87.6        Qusay Villada, Jenny Reichmann, MD 04/08/19 [phone]

## 2019-04-07 NOTE — ED Triage Notes (Signed)
Patient complaining of mid chest flutter. Patient does not know if it is gerd or her hurt. Patient states the pain started an hour ago.

## 2019-04-08 ENCOUNTER — Ambulatory Visit (HOSPITAL_BASED_OUTPATIENT_CLINIC_OR_DEPARTMENT_OTHER): Payer: No Typology Code available for payment source

## 2019-04-08 DIAGNOSIS — R0789 Other chest pain: Secondary | ICD-10-CM

## 2019-04-08 DIAGNOSIS — R9431 Abnormal electrocardiogram [ECG] [EKG]: Secondary | ICD-10-CM | POA: Diagnosis not present

## 2019-04-08 DIAGNOSIS — E785 Hyperlipidemia, unspecified: Secondary | ICD-10-CM

## 2019-04-08 LAB — ECHOCARDIOGRAM COMPLETE

## 2019-04-08 LAB — DIFFERENTIAL
Basophils Absolute: 0 10*3/uL (ref 0.0–0.1)
Basophils Relative: 1 %
Eosinophils Absolute: 0.1 10*3/uL (ref 0.0–0.5)
Eosinophils Relative: 2 %
Lymphocytes Relative: 38 %
Lymphs Abs: 1.9 10*3/uL (ref 0.7–4.0)
Monocytes Absolute: 0.4 10*3/uL (ref 0.1–1.0)
Monocytes Relative: 8 %
Neutro Abs: 2.6 10*3/uL (ref 1.7–7.7)
Neutrophils Relative %: 52 %

## 2019-04-08 LAB — TSH: TSH: 3.674 u[IU]/mL (ref 0.350–4.500)

## 2019-04-08 LAB — URINALYSIS, ROUTINE W REFLEX MICROSCOPIC
Bilirubin Urine: NEGATIVE
Glucose, UA: NEGATIVE mg/dL
Ketones, ur: NEGATIVE mg/dL
Nitrite: NEGATIVE
Protein, ur: NEGATIVE mg/dL
Specific Gravity, Urine: 1.002 — ABNORMAL LOW (ref 1.005–1.030)
pH: 6 (ref 5.0–8.0)

## 2019-04-08 LAB — HEPATIC FUNCTION PANEL
ALT: 31 U/L (ref 0–44)
AST: 33 U/L (ref 15–41)
Albumin: 4.1 g/dL (ref 3.5–5.0)
Alkaline Phosphatase: 84 U/L (ref 38–126)
Bilirubin, Direct: 0.3 mg/dL — ABNORMAL HIGH (ref 0.0–0.2)
Indirect Bilirubin: 1.7 mg/dL — ABNORMAL HIGH (ref 0.3–0.9)
Total Bilirubin: 2 mg/dL — ABNORMAL HIGH (ref 0.3–1.2)
Total Protein: 6.6 g/dL (ref 6.5–8.1)

## 2019-04-08 LAB — TROPONIN I (HIGH SENSITIVITY): Troponin I (High Sensitivity): 6 ng/L (ref <18)

## 2019-04-08 MED ORDER — POTASSIUM CHLORIDE CRYS ER 20 MEQ PO TBCR
40.0000 meq | EXTENDED_RELEASE_TABLET | Freq: Once | ORAL | Status: AC
  Administered 2019-04-08: 01:00:00 40 meq via ORAL
  Filled 2019-04-08: qty 2

## 2019-04-08 MED ORDER — POTASSIUM CHLORIDE 20 MEQ/15ML (10%) PO SOLN
20.0000 meq | Freq: Once | ORAL | Status: AC
  Administered 2019-04-08: 01:00:00 20 meq via ORAL
  Filled 2019-04-08: qty 15

## 2019-04-09 ENCOUNTER — Telehealth: Payer: Self-pay | Admitting: Gastroenterology

## 2019-04-09 NOTE — Telephone Encounter (Signed)
I spoke with the pt and she is under the impression that Dr Joylene Draft spoke with Dr Henrene Pastor about her and Dr Henrene Pastor is seeing her next week in the office.  I see no mention of this in the chart and see no appt openings.  Dr Henrene Pastor please advise

## 2019-04-09 NOTE — Telephone Encounter (Signed)
Pt is scheduled 04/30/19 with Janett Billow but reported that her symptoms are worsening and requested a call back to discuss.

## 2019-04-09 NOTE — Telephone Encounter (Signed)
The pt was last seen by Dr Henrene Pastor needs to go to Dr Blanch Media nurse.  Thanks

## 2019-04-10 NOTE — Progress Notes (Signed)
Cardiology Office Note   Date:  04/12/2019   ID:  [name], [name] [birth year], MRN [id number]  PCP:  Patricia Infante, MD  Cardiologist:   Minus Breeding, MD Referring:  Patricia Infante, MD   Chief Complaint  Patient presents with  . Palpitations      History of Present Illness: Patricia Flynn is a 73 y.o. female who presents is referred by Patricia Infante, MD for evaluation of palpitations.  She was in the ED for this earlier this month.  I reviewed these records for this visit.     She has no past history other than HTN and a negative perfusion study 2012.      The patient presents as a new patient.  Has had some odd sensations that she cannot really describe.  This was happening since July.  She would feel this sensation which was substernal.  She wants to describe it as a fluttering but is not quite a fluttering.  Seems to happen after she eats and she thought it was something GI.  However, she only feels it really has not.  She might feel it skipping.  There is some sense of discomfort.  She walks for exercise.  She does 18 holes of golf routinely.  She does yoga.  She goes 3 miles of walking.  She is had some sore sensation to touch under her breasts.  Some of this is associated with some varicella that she is having the remnants of.  She is not describing substernal chest pressure, neck or arm discomfort.  She is not describing new shortness of breath, PND or orthopnea.  She has had no presyncope or syncope.  She is had no weight gain or edema.   Past Medical History:  Diagnosis Date  . Diverticulosis of colon (without mention of hemorrhage)   . Elevated LFTs   . History of breast cancer 1995  . Hypertension 2012    Past Surgical History:  Procedure Laterality Date  . CHOLECYSTECTOMY  09/15/2012   Procedure: LAPAROSCOPIC CHOLECYSTECTOMY WITH INTRAOPERATIVE CHOLANGIOGRAM;  Surgeon: Haywood Lasso, MD;  Location: [facility];  Service: General;  Laterality: N/A;  . [place]    left, followed by reconstruction      Current Outpatient Medications  Medication Sig Dispense Refill  . BYSTOLIC 5 MG tablet Take 5 mg by mouth at bedtime.     . Estradiol 10 MCG TABS vaginal tablet 1 pv nightly x 14 days and then twice weekly. 28 tablet 12  . NONFORMULARY OR COMPOUNDED ITEM Vitamin E vaginal suppositories 200u/ml.  One pv two to three times weekly. 36 each 3  . omeprazole (PRILOSEC OTC) 20 MG tablet Take 20 mg by mouth daily.    . rosuvastatin (CRESTOR) 5 MG tablet Take 1 tablet by mouth daily.     Current Facility-Administered Medications  Medication Dose Route Frequency Provider Last Rate Last Dose  . methylPREDNISolone acetate (DEPO-MEDROL) injection 40 mg  40 mg Intra-articular Once Hilts, Michael, MD        Allergies:   Patient has no known allergies.    Social History:  The patient  reports that she quit smoking about 40 years ago. Her smoking use included cigarettes. She has never used smokeless tobacco. She reports current alcohol use of about 14.0 standard drinks of alcohol per week. She reports that she does not use drugs.   Family History:  The patient's family history includes Breast cancer (age of onset: 71) in  her mother; Heart disease (age of onset: 33) in her father.    ROS:  Please see the history of present illness.   Otherwise, review of systems are positive for none.   All other systems are reviewed and negative.    PHYSICAL EXAM: VS:  BP 130/90 (BP Location: Right Arm, Patient Position: Sitting, Cuff Size: Normal)   Pulse 60   Temp 98.2 F (36.8 C)   Ht 5' 5.5" (1.664 m)   Wt 143 lb (64.9 kg)   BMI 23.43 kg/m  , BMI Body mass index is 23.43 kg/m. GENERAL:  Well appearing HEENT:  Pupils equal round and reactive, fundi not visualized, oral mucosa unremarkable NECK:  No jugular venous distention, waveform within normal limits, carotid upstroke brisk and symmetric, no bruits, no thyromegaly LYMPHATICS:  No cervical, inguinal adenopathy  LUNGS:  Clear to auscultation bilaterally BACK:  No CVA tenderness CHEST:  Left mastectomy, left breast implant.   HEART:  PMI not displaced or sustained,S1 and S2 within normal limits, no S3, no S4, no clicks, no rubs, no murmurs ABD:  Flat, positive bowel sounds normal in frequency in pitch, no bruits, no rebound, no guarding, no midline pulsatile mass, no hepatomegaly, no splenomegaly EXT:  2 plus pulses throughout, no edema, no cyanosis no clubbing SKIN:  No rashes no nodules NEURO:  Cranial nerves II through XII grossly intact, motor grossly intact throughout PSYCH:  Cognitively intact, oriented to person place and time    EKG:  EKG is not ordered today. The ekg ordered 04/07/19 demonstrated  Sinus rhythm, rate 69, leftward axis, intervals within normal limits, no acute ST-T wave changes.   Recent Labs: 04/07/2019: ALT 31; BUN 14; Creatinine, Ser 0.87; Hemoglobin 13.9; Platelets 172; Potassium 3.3; Sodium 141 04/08/2019: TSH 3.674    Lipid Panel    Component Value Date/Time   CHOL 220 (H) 06/13/2009 0800   TRIG 45.0 06/13/2009 0800   TRIG 48 06/17/2006 1039   HDL 73.60 06/13/2009 0800   CHOLHDL 3 06/13/2009 0800   VLDL 9.0 06/13/2009 0800   LDLDIRECT 134.5 06/13/2009 0800      Wt Readings from Last 3 Encounters:  04/12/19 143 lb (64.9 kg)  04/07/19 135 lb (61.2 kg)  02/23/19 143 lb (64.9 kg)      Other studies Reviewed: Additional studies/ records that were reviewed today include: ED records. Review of the above records demonstrates:  Please see elsewhere in the note.     ASSESSMENT AND PLAN:  PALPITATIONS:   I suspect PVC or PACs.  Further evaluation and management will be based on these results.    HTN:  The blood pressure is at target. No change in medications is indicated. We will continue with therapeutic lifestyle changes (TLC).  CHEST PAIN:  I think this is atypical but I will plan to start with a coronary calcium scre.   Current medicines are reviewed at  length with the patient today.  The patient does not have concerns regarding medicines.  The following changes have been made:  no change  Labs/ tests ordered today include:   Orders Placed This Encounter  Procedures  . CT CARDIAC SCORING  . LONG TERM MONITOR (3-14 DAYS)  . EKG 12-Lead     Disposition:   FU with me in 2 months.      Signed, Minus Breeding, MD  04/12/2019 1:55 PM    [place]

## 2019-04-12 ENCOUNTER — Other Ambulatory Visit: Payer: Self-pay

## 2019-04-12 ENCOUNTER — Encounter: Payer: Self-pay | Admitting: Cardiology

## 2019-04-12 ENCOUNTER — Telehealth: Payer: Self-pay | Admitting: *Deleted

## 2019-04-12 ENCOUNTER — Ambulatory Visit (INDEPENDENT_AMBULATORY_CARE_PROVIDER_SITE_OTHER): Payer: PRIVATE HEALTH INSURANCE | Admitting: Cardiology

## 2019-04-12 VITALS — BP 130/90 | HR 60 | Temp 98.2°F | Ht 65.5 in | Wt 143.0 lb

## 2019-04-12 DIAGNOSIS — R079 Chest pain, unspecified: Secondary | ICD-10-CM

## 2019-04-12 DIAGNOSIS — R002 Palpitations: Secondary | ICD-10-CM | POA: Diagnosis not present

## 2019-04-12 NOTE — Patient Instructions (Addendum)
Medication Instructions:  Your physician recommends that you continue on your current medications as directed. Please refer to the Current Medication list given to you today.  If you need a refill on your cardiac medications before your next appointment, please call your pharmacy.   Lab work: NONE   Testing/Procedures: 3 DAY ZIO MONITOR   CORONARY CALCIUM SCORE  THIS WILL COST $150 OUT OF POCKET   Follow-Up: At [facility], you and your health needs are our priority.  As part of our continuing mission to provide you with exceptional heart care, we have created designated Provider Care Teams.  These Care Teams include your primary Cardiologist (physician) and Advanced Practice Providers (APPs -  Physician Assistants and Nurse Practitioners) who all work together to provide you with the care you need, when you need it. You will need a follow up appointment in 2 months.    You may see DR [facility]  or one of the following Advanced Practice Providers on your designated Care Team:   Rosaria Ferries, PA-C . Jory Sims, DNP, ANP  Any Other Special Instructions Will Be Listed Below (If Applicable).

## 2019-04-12 NOTE — Telephone Encounter (Signed)
3 day ZIO XT long term holter monitor to be mailed to patients home.  Instructions included in the monitor kit.

## 2019-04-13 ENCOUNTER — Ambulatory Visit (INDEPENDENT_AMBULATORY_CARE_PROVIDER_SITE_OTHER)
Admission: RE | Admit: 2019-04-13 | Discharge: 2019-04-13 | Disposition: A | Discharge status: Home / Self Care | Payer: Self-pay | Source: Ambulatory Visit | Attending: Cardiology | Admitting: Cardiology

## 2019-04-13 DIAGNOSIS — R079 Chest pain, unspecified: Secondary | ICD-10-CM

## 2019-04-13 DIAGNOSIS — R002 Palpitations: Secondary | ICD-10-CM

## 2019-04-13 NOTE — Telephone Encounter (Signed)
Linda, Please let the patient know that I did speak with Dr. Joylene Draft last week (he texted me while I was away) and agreed to work her in the later half of this week, when I returned from vacation and could review my schedule. I am full, but happy to add her on this Friday August 14 at 4 pm as an overbook. Please let her know and give her my best. Thanks  Dr. Henrene Pastor

## 2019-04-13 NOTE — Telephone Encounter (Signed)
Spoke with pt and she is aware. Pt scheduled to see Dr. Henrene Pastor 04/16/19@4pm .

## 2019-04-16 ENCOUNTER — Ambulatory Visit (INDEPENDENT_AMBULATORY_CARE_PROVIDER_SITE_OTHER): Payer: PRIVATE HEALTH INSURANCE | Admitting: Internal Medicine

## 2019-04-16 ENCOUNTER — Encounter: Payer: Self-pay | Admitting: Internal Medicine

## 2019-04-16 VITALS — BP 124/80 | HR 64 | Temp 98.2°F | Ht 65.0 in | Wt 138.5 lb

## 2019-04-16 DIAGNOSIS — K589 Irritable bowel syndrome without diarrhea: Secondary | ICD-10-CM | POA: Diagnosis not present

## 2019-04-16 DIAGNOSIS — F419 Anxiety disorder, unspecified: Secondary | ICD-10-CM

## 2019-04-16 DIAGNOSIS — K219 Gastro-esophageal reflux disease without esophagitis: Secondary | ICD-10-CM

## 2019-04-16 DIAGNOSIS — R1013 Epigastric pain: Secondary | ICD-10-CM

## 2019-04-16 NOTE — Progress Notes (Signed)
HISTORY OF PRESENT ILLNESS:  Patricia Flynn is a 73 y.o. female with chronic functional abdominal complaints who I initially saw December 13, 2014 and most recently saw Jan 25, 2015.  See that dictation for details.  The clinical impressions were chronic functional abdominal complaints, health-related anxiety with possible underlying anxiety and depression.  She is status post cholecystectomy January 2000 team.  Her last colonoscopy was performed March 2016.  This was normal.  She has not been seen since.  She presents today with a myriad of GI and non-GI complaints.  She is quite anxious and nearly tearful.  She tells me that since May she has had several episodes in her mid upper abdomen which she describes as waves of unsettled feeling.  Typically lasting only 30 to 45 seconds but resulting great worry.  She has had about 4 of these episodes.  She contacted this office on April 06, 2019 stating that she felt her "acid reflux" and "IBS" were acting up.  She mentioned that she had been on omeprazole twice daily for 1 week and this was not helping.  She also reported recent diagnosis of shingles along the left mid abdomen for which she was placed on Valtrex.  Mentions that her stomach feels like a freight train at times.  She did take Valtrex.  She reports significant problems with increased belching.  I had recommended IBgard twice daily and follow-up in 4 weeks.  She did start and has continued on IBgard.  She wished to be seen sooner.  She did go to the emergency room April 07, 2019 with palpitations.  She was subsequently seen by Dr. Percival Spanish April 12, 2019 regarding the same.  I have reviewed his evaluation.  She is for outpatient Holter testing to begin later today.  She also mentions to me soreness at the base of her sternum which is most noticeable during exercise.  This worries her.  She feels that her bowel habits have been fairly regular.  She denies pain in her back.  No nausea or vomiting.  She cannot  say that her discomfort is similar to when she had gallbladder pain.  She mentions various effects on her GI system with various types of food and beverage.  Finally, she did have success with her abdominal complaints previously after course of Xifaxan therapy.  She had a refill I restarted this early next week.  REVIEW OF SYSTEMS:  All non-GI ROS negative unless otherwise stated in the HPI except for shingles  Past Medical History:  Diagnosis Date  . Diverticulosis of colon (without mention of hemorrhage)   . Elevated LFTs   . History of breast cancer 1995  . Hypertension 2012    Past Surgical History:  Procedure Laterality Date  . CHOLECYSTECTOMY  09/15/2012   Procedure: LAPAROSCOPIC CHOLECYSTECTOMY WITH INTRAOPERATIVE CHOLANGIOGRAM;  Surgeon: Haywood Lasso, MD;  Location: [facility];  Service: General;  Laterality: N/A;  . MASTECTOMY  1995   left, followed by reconstruction     Social History Patricia Flynn  reports that she quit smoking about 40 years ago. Her smoking use included cigarettes. She has never used smokeless tobacco. She reports current alcohol use of about 14.0 standard drinks of alcohol per week. She reports that she does not use drugs.  family history includes Breast cancer (age of onset: 64) in her mother; Heart disease (age of onset: 55) in her father.  No Known Allergies     PHYSICAL EXAMINATION: Vital signs: BP 124/80 (  BP Location: Right Arm, Patient Position: Sitting, Cuff Size: Normal)   Pulse 64   Temp 98.2 F (36.8 C)   Ht '5\' 5"'$  (1.651 m)   Wt 138 lb 8 oz (62.8 kg)   BMI 23.05 kg/m   Constitutional: generally well-appearing, no acute distress but quite distraught appearing Psychiatric: alert and oriented x3, cooperative Eyes: extraocular movements intact, anicteric, conjunctiva pink Mouth: oral pharynx moist, no lesions.  No thrush Neck: supple no lymphadenopathy Cardiovascular: heart regular rate and rhythm, no murmur Lungs: clear to  auscultation bilaterally Abdomen: soft, nontender, nondistended, no obvious ascites, no peritoneal signs, normal bowel sounds, no organomegaly.  Spot on lower sternum palpated to reproduce her reported pain. Rectal: Omitted Extremities: no clubbing, cyanosis, or lower extremity edema bilaterally Skin: no lesions on visible extremities Neuro: No focal deficits.  Cranial nerves intact.  Normal DTRs  ASSESSMENT:  1.  Chronic functional abdominal complaints related to anxiety.  Belching related to the same.  Rule out potential organic causes for recent symptom complex 2.  Benign discomfort along the lower sternum in the region of the xiphoid process 3.  Recent problems with palpitations being worked up 4.  Recent problems with shingles.  Treated 5.  Health-related anxiety 6.  Likely underlying anxiety and depression.  Could have situational component with COVID  PLAN:  1.  Continue IBgard twice daily 2.  Continue Xifaxan 550 mg 3 times daily for 2 weeks 3.  Schedule upper endoscopy to evaluate abdominal complaints.  Rule out organic cause. 4.  Continue PPI for now 5.  Cardiac work-up with Dr. Percival Spanish 6.  Discussion today regarding functional abdominal complaints 7.  If benefit from medical treatment treatment for anxiety/depression such as SSRI.  This may help abdominal complaints as well 45 minutes spent face-to-face with the patient.  Greater than 50% of the time used for counseling regarding her active GI issues and the related healthcare plan

## 2019-04-16 NOTE — Patient Instructions (Signed)
You have been scheduled for an endoscopy. Please follow written instructions given to you at your visit today. If you use inhalers (even only as needed), please bring them with you on the day of your procedure.   

## 2019-04-17 ENCOUNTER — Ambulatory Visit (INDEPENDENT_AMBULATORY_CARE_PROVIDER_SITE_OTHER): Payer: PRIVATE HEALTH INSURANCE

## 2019-04-17 DIAGNOSIS — R079 Chest pain, unspecified: Secondary | ICD-10-CM

## 2019-04-17 DIAGNOSIS — R002 Palpitations: Secondary | ICD-10-CM | POA: Diagnosis not present

## 2019-04-19 ENCOUNTER — Telehealth: Payer: Self-pay | Admitting: *Deleted

## 2019-04-19 DIAGNOSIS — I712 Thoracic aortic aneurysm, without rupture, unspecified: Secondary | ICD-10-CM

## 2019-04-19 DIAGNOSIS — I7789 Other specified disorders of arteries and arterioles: Secondary | ICD-10-CM

## 2019-04-19 NOTE — Telephone Encounter (Signed)
Advised patient, verbalized understanding  

## 2019-04-19 NOTE — Telephone Encounter (Signed)
-----   Message from Minus Breeding, MD sent at 04/13/2019 10:34 AM EDT ----- She has zero coronary calcium which is good.  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  I will follow up with a CT aorta in six months.  This is likely not something we will ever need to do anything about but simply will follow.  Call Ms. Patricia Flynn with the results and send results to Crist Infante, MD

## 2019-04-19 NOTE — Telephone Encounter (Signed)
Left message to call back  

## 2019-04-27 ENCOUNTER — Encounter: Payer: Self-pay | Admitting: Internal Medicine

## 2019-04-28 ENCOUNTER — Telehealth: Payer: Self-pay | Admitting: *Deleted

## 2019-04-28 NOTE — Telephone Encounter (Signed)
Covid-19 screening questions   Do you now or have you had a fever in the last 14 days no   Do you have any respiratory symptoms of shortness of breath or cough now or in the last 14 days no  Do you have any family members or close contacts with diagnosed or suspected Covid-19 in the past 14 days no  Have you been tested for Covid-19 and found to be positive no          

## 2019-04-29 ENCOUNTER — Encounter: Payer: Self-pay | Admitting: Internal Medicine

## 2019-04-29 ENCOUNTER — Other Ambulatory Visit: Payer: Self-pay

## 2019-04-29 ENCOUNTER — Ambulatory Visit (AMBULATORY_SURGERY_CENTER): Payer: PRIVATE HEALTH INSURANCE | Admitting: Internal Medicine

## 2019-04-29 VITALS — BP 132/75 | HR 49 | Temp 98.1°F | Resp 12 | Ht 65.0 in | Wt 138.0 lb

## 2019-04-29 DIAGNOSIS — R1013 Epigastric pain: Secondary | ICD-10-CM

## 2019-04-29 DIAGNOSIS — K589 Irritable bowel syndrome without diarrhea: Secondary | ICD-10-CM | POA: Diagnosis not present

## 2019-04-29 DIAGNOSIS — F419 Anxiety disorder, unspecified: Secondary | ICD-10-CM

## 2019-04-29 DIAGNOSIS — K30 Functional dyspepsia: Secondary | ICD-10-CM | POA: Diagnosis present

## 2019-04-29 DIAGNOSIS — K219 Gastro-esophageal reflux disease without esophagitis: Secondary | ICD-10-CM

## 2019-04-29 MED ORDER — SERTRALINE HCL 50 MG PO TABS: ORAL_TABLET | ORAL | 2 refills | Status: DC

## 2019-04-29 MED ORDER — SODIUM CHLORIDE 0.9 % IV SOLN: 500.0000 mL | Freq: Once | INTRAVENOUS | Status: DC

## 2019-04-29 NOTE — Progress Notes (Signed)
Report to PACU, RN, vss, BBS= Clear.  

## 2019-04-29 NOTE — Op Note (Signed)
[facility] Patient Name: Patricia Flynn Procedure Date: 04/29/2019 9:45 AM MRN: [id number] Endoscopist: Docia Chuck. Henrene Pastor , MD Age: 73 Referring MD:  Date of Birth: 1946-05-08 Gender: Female Account #: [id number] Procedure:                Upper GI endoscopy Indications:              Dyspepsia Medicines:                Monitored Anesthesia Care Procedure:                Pre-Anesthesia Assessment:                           - Prior to the procedure, a History and Physical                            was performed, and patient medications and                            allergies were reviewed. The patient's tolerance of                            previous anesthesia was also reviewed. The risks                            and benefits of the procedure and the sedation                            options and risks were discussed with the patient.                            All questions were answered, and informed consent                            was obtained. Prior Anticoagulants: The patient has                            taken no previous anticoagulant or antiplatelet                            agents. ASA Grade Assessment: II - A patient with                            mild systemic disease. After reviewing the risks                            and benefits, the patient was deemed in                            satisfactory condition to undergo the procedure.                           After obtaining informed consent, the endoscope was  passed under direct vision. Throughout the                            procedure, the patient's blood pressure, pulse, and                            oxygen saturations were monitored continuously. The                            Endoscope was introduced through the mouth, and                            advanced to the second part of duodenum. The upper                            GI endoscopy was accomplished without  difficulty.                            The patient tolerated the procedure well. Scope In: Scope Out: Findings:                 The esophagus was normal.                           The stomach was normal.                           The examined duodenum was normal.                           The cardia and gastric fundus were normal on                            retroflexion. Complications:            No immediate complications. Estimated Blood Loss:     Estimated blood loss: none. Impression:               1. Functional dyspepsia                           2. IBS                           3. Anxiety. Recommendation:           - Patient has a contact number available for                            emergencies. The signs and symptoms of potential                            delayed complications were discussed with the                            patient. Return to normal activities tomorrow.  Written discharge instructions were provided to the                            patient.                           - Resume previous diet.                           - Continue present medications.                           - Prescribe Zoloft 50 mg; #30; 2 refills; take 1 at                            night                           - Office follow-up with Dr. Henrene Pastor in 4 weeks Docia Chuck. Henrene Pastor, MD 04/29/2019 [id number] AM This report has been signed electronically.

## 2019-04-29 NOTE — Progress Notes (Signed)
Temperature- Patricia Flynn VS-[name] 

## 2019-04-29 NOTE — Patient Instructions (Signed)
Office appointment with Dr Henrene Pastor in 4 weeks - make this appointment  Zoloft Rx sent in to [facility] for you to pick up   [place]:   Refer to the procedure report that was given to you for any specific questions about what was found during the examination.  If the procedure report does not answer your questions, please call your gastroenterologist to clarify.  If you requested that your care partner not be given the details of your procedure findings, then the procedure report has been included in a sealed envelope for you to review at your convenience later.  YOU SHOULD EXPECT: Some feelings of bloating in the abdomen. Passage of more gas than usual.  Walking can help get rid of the air that was put into your GI tract during the procedure and reduce the bloating. If you had a lower endoscopy (such as a colonoscopy or flexible sigmoidoscopy) you may notice spotting of blood in your stool or on the toilet paper. If you underwent a bowel prep for your procedure, you may not have a normal bowel movement for a few days.  Please Note:  You might notice some irritation and congestion in your nose or some drainage.  This is from the oxygen used during your procedure.  There is no need for concern and it should clear up in a day or so.  SYMPTOMS TO REPORT IMMEDIATELY:     Following upper endoscopy (EGD)  Vomiting of blood or coffee ground material  New chest pain or pain under the shoulder blades  Painful or persistently difficult swallowing  New shortness of breath  Fever of 100F or higher  Black, tarry-looking stools  For urgent or emergent issues, a gastroenterologist can be reached at any hour by calling [phone].   DIET:  We do recommend a small meal at first, but then you may proceed to your regular diet.  Drink plenty of fluids but you should avoid alcoholic beverages for 24 hours.  ACTIVITY:  You should plan  to take it easy for the rest of today and you should NOT DRIVE or use heavy machinery until tomorrow (because of the sedation medicines used during the test).    FOLLOW UP: Our staff will call the number listed on your records 48-72 hours following your procedure to check on you and address any questions or concerns that you may have regarding the information given to you following your procedure. If we do not reach you, we will leave a message.  We will attempt to reach you two times.  During this call, we will ask if you have developed any symptoms of COVID 19. If you develop any symptoms (ie: fever, flu-like symptoms, shortness of breath, cough etc.) before then, please call [phone].  If you test positive for Covid 19 in the 2 weeks post procedure, please call and report this information to [place].    If any biopsies were taken you will be contacted by phone or by letter within the next 1-3 weeks.  Please call us at [phone] if you have not heard about the biopsies in 3 weeks.    SIGNATURES/CONFIDENTIALITY: You and/or your care partner have signed paperwork which will be entered into your electronic medical record.  These signatures attest to the fact that that the information above on your After Visit Summary has been reviewed and is understood.  Full responsibility of the confidentiality of this  discharge information lies with you and/or your care-partner. 

## 2019-04-30 ENCOUNTER — Encounter

## 2019-04-30 ENCOUNTER — Ambulatory Visit: Payer: PRIVATE HEALTH INSURANCE | Admitting: Gastroenterology

## 2019-05-03 ENCOUNTER — Telehealth: Payer: Self-pay

## 2019-05-03 ENCOUNTER — Telehealth: Payer: Self-pay | Admitting: *Deleted

## 2019-05-03 NOTE — Telephone Encounter (Signed)
  Follow up Call-  Call back number 04/29/2019  Post procedure Call Back phone  # [phone]  Permission to leave phone message Yes  Some recent data might be hidden     Patient questions:  Do you have a fever, pain , or abdominal swelling? No. Pain Score  0 *  Have you tolerated food without any problems? Yes.    Have you been able to return to your normal activities? Yes.    Do you have any questions about your discharge instructions: Diet   No. Medications  No. Follow up visit  No.  Do you have questions or concerns about your Care? Yes.    Actions: * If pain score is 4 or above:  No action needed, pain <4.  1. Have you developed a fever since your procedure? no  2.   Have you had an respiratory symptoms (SOB or cough) since your procedure? no  3.   Have you tested positive for COVID 19 since your procedure no  4.   Have you had any family members/close contacts diagnosed with the COVID 19 since your procedure?  no   If yes to any of these questions please route to Joylene John, RN and Alphonsa Gin, Therapist, sports.

## 2019-05-03 NOTE — Telephone Encounter (Signed)
First post procedure follow up call, no answer 

## 2019-05-27 ENCOUNTER — Telehealth: Payer: Self-pay | Admitting: *Deleted

## 2019-05-27 NOTE — Telephone Encounter (Signed)
Patient changed to in office visit

## 2019-05-27 NOTE — Telephone Encounter (Signed)
  Dr Percival Spanish seeing patients in office, ok to see in clinic if patient wants. Left message to call back

## 2019-05-31 ENCOUNTER — Ambulatory Visit: Payer: PRIVATE HEALTH INSURANCE | Admitting: Internal Medicine

## 2019-05-31 ENCOUNTER — Other Ambulatory Visit: Payer: Self-pay

## 2019-05-31 ENCOUNTER — Encounter: Payer: Self-pay | Admitting: Internal Medicine

## 2019-05-31 VITALS — BP 124/72 | HR 58 | Temp 97.9°F | Ht 65.5 in | Wt 137.0 lb

## 2019-05-31 DIAGNOSIS — K589 Irritable bowel syndrome without diarrhea: Secondary | ICD-10-CM

## 2019-05-31 NOTE — Patient Instructions (Signed)
Please follow up in three months.

## 2019-05-31 NOTE — Progress Notes (Signed)
HISTORY OF PRESENT ILLNESS:  Patricia Flynn is a 73 y.o. female with a history of chronic functional abdominal complaints, health-related anxiety and prior cholecystectomy who presents today for follow-up.  She was last evaluated in the office April 16, 2019 regarding abdominal complaints as described.  She was felt to have a significant underlying component of anxiety.  She completed a two-week course of Xifaxan as well as IBgard.  She underwent diagnostic upper endoscopy April 29, 2019.  Examination was normal.  For functional dyspepsia, IBS, and anxiety she was placed on Zoloft 50 mg daily.  After the initial 2 dosages, she woke up the following morning with hangover symptoms.  Thus she decreased the dose to 25 mg at night.  She is also taking omeprazole 20 mg in the morning.  She presents today for follow-up as requested.  Overall she is pleased to report that in general she is feeling much better.  She still describes episodes when, particularly after exercise, she will have the urge to defecate which is typically loose and associated with some urgency though no incontinence.  Previous chest symptoms have improved.  She is undergone cardiac work-up.  She has a number of excellent questions but no new problems  REVIEW OF SYSTEMS:  All non-GI ROS negative unless otherwise stated in the HPI.  Past Medical History:  Diagnosis Date  . cancer    left breast cancer  . Diverticulosis of colon (without mention of hemorrhage)   . Elevated LFTs   . GERD (gastroesophageal reflux disease)   . History of breast cancer 1995  . Hyperlipidemia   . Hypertension 2012    Past Surgical History:  Procedure Laterality Date  . CHOLECYSTECTOMY  09/15/2012   Procedure: LAPAROSCOPIC CHOLECYSTECTOMY WITH INTRAOPERATIVE CHOLANGIOGRAM;  Surgeon: Haywood Lasso, MD;  Location: [facility];  Service: General;  Laterality: N/A;  . COLONOSCOPY    . MASTECTOMY  1995   left, followed by reconstruction   . UPPER  GASTROINTESTINAL ENDOSCOPY      Social History JANELLI RITTENBERG  reports that she quit smoking about 40 years ago. Her smoking use included cigarettes. She has never used smokeless tobacco. She reports current alcohol use of about 14.0 standard drinks of alcohol per week. She reports that she does not use drugs.  family history includes Breast cancer (age of onset: 65) in her mother; Heart disease (age of onset: 76) in her father.  No Known Allergies     PHYSICAL EXAMINATION: Vital signs: BP 124/72   Pulse (!) 58   Temp 97.9 F (36.6 C)   Ht 5' 5.5" (1.664 m)   Wt 137 lb (62.1 kg)   BMI 22.45 kg/m   Constitutional: generally well-appearing, no acute distress.  Looks much better than her last visit Psychiatric: alert and oriented x3, cooperative.  Pleasant Abdomen: Not reexamined Neuro: No obvious deficits.  ASSESSMENT:  1.  Functional dyspepsia.  Improved on Zoloft.  Also on PPI 2.  IBS.  Improved on Zoloft 3.  Anxiety.  Improved on Zoloft 4.  Normal upper endoscopy 04/29/2019 5.  Normal colonoscopy November 17, 2014 6.  Status post cholecystectomy  PLAN:  1.  Continue omeprazole 20 mg daily.  Medication effects, side effects and risks reviewed 2.  Continue Zoloft 25 mg at night.  Medication effects, side effects, and risks reviewed.  I discussed with her the approach with SSRI therapy in terms of time to expect maximal effect and the rationale behind any dosage adjustments. 3.  Office follow-up in 3 months 4.  Contact the office in the interim for any questions or problems 15-minute spent face-to-face with the patient.  Near the entire time spent counseling regarding her abdominal issues, treatment, and follow-up.  As well, answering questions to her satisfaction

## 2019-06-01 DIAGNOSIS — I77810 Thoracic aortic ectasia: Secondary | ICD-10-CM | POA: Insufficient documentation

## 2019-06-01 NOTE — Progress Notes (Signed)
Cardiology Office Note   Date:  06/03/2019   ID:  Patricia Flynn, DOB 08-16-1946, MRN [id number]  PCP:  Crist Infante, MD  Cardiologist:   Minus Breeding, MD Referring:  Crist Infante, MD   Chief Complaint  Patient presents with  . Palpitations      History of Present Illness: Patricia Flynn is a 74 y.o. female who presents was referred by Crist Infante, MD for evaluation of palpitations.  She was in the ED for this earlier this in August.   She has no past history other than HTN and a negative perfusion study 2012.      After my last visit I sent her for a CT which demonstrated no coronary calcium although there was mild aortic dilatation.  An echo was done and there were no significant abnormalities.  EF was low normal.  She had a monitor which demonstrated some paroxysmal atrial tachycardia but no atrial fibrillation.  SVT was nonsustained.  It was not clear how much she was having in the way of symptoms.  There was very rare ventricular ectopy.  She has had her irritable bowel treated.  She is on Zoloft.  She says reflux is better.  She still occasionally gets palpitations at night but she is had nothing sustained.  She has had no chest pressure, neck or arm discomfort.  She has had no presyncope or syncope.  She denies any PND or orthopnea.  She is exercising routinely.  Past Medical History:  Diagnosis Date  . cancer    left breast cancer  . Diverticulosis of colon (without mention of hemorrhage)   . Elevated LFTs   . GERD (gastroesophageal reflux disease)   . History of breast cancer 1995  . Hyperlipidemia   . Hypertension 2012    Past Surgical History:  Procedure Laterality Date  . CHOLECYSTECTOMY  09/15/2012   Procedure: LAPAROSCOPIC CHOLECYSTECTOMY WITH INTRAOPERATIVE CHOLANGIOGRAM;  Surgeon: Haywood Lasso, MD;  Location: [facility];  Service: General;  Laterality: N/A;  . COLONOSCOPY    . MASTECTOMY  1995   left, followed by reconstruction   . UPPER  GASTROINTESTINAL ENDOSCOPY       Current Outpatient Medications  Medication Sig Dispense Refill  . BYSTOLIC 5 MG tablet Take 5 mg by mouth at bedtime.     . Estradiol 10 MCG TABS vaginal tablet 1 pv nightly x 14 days and then twice weekly. 28 tablet 12  . NONFORMULARY OR COMPOUNDED ITEM Vitamin E vaginal suppositories 200u/ml.  One pv two to three times weekly. 36 each 3  . omeprazole (PRILOSEC OTC) 20 MG tablet Take 20 mg by mouth daily.    . rosuvastatin (CRESTOR) 5 MG tablet Take 1 tablet by mouth daily.    . sertraline (ZOLOFT) 50 MG tablet Zoloft 50 mg by mouth ( take one at night ) #30 ; 2 refills (Patient taking differently: 25 mg. Zoloft 50 mg by mouth ( take one at night ) #30 ; 2 refills Takes 25 mg QHS) 30 tablet 2   Current Facility-Administered Medications  Medication Dose Route Frequency Provider Last Rate Last Dose  . methylPREDNISolone acetate (DEPO-MEDROL) injection 40 mg  40 mg Intra-articular Once Hilts, Michael, MD        Allergies:   Patient has no known allergies.     ROS:  Please see the history of present illness.   Otherwise, review of systems are positive for none.   All other systems are reviewed and negative.  PHYSICAL EXAM: VS:  BP 125/85 (Patient Position: Sitting)   Pulse 61   Temp (!) 97 F (36.1 C)   Wt 138 lb 9.6 oz (62.9 kg)   SpO2 97%   BMI 22.71 kg/m  , BMI Body mass index is 22.71 kg/m. GENERAL:  Well appearing HEENT:  Pupils equal round and reactive, fundi not visualized, oral mucosa unremarkable NECK:  No jugular venous distention, waveform within normal limits, carotid upstroke brisk and symmetric, no bruits, no thyromegaly LYMPHATICS:  No cervical, inguinal adenopathy LUNGS:  Clear to auscultation bilaterally BACK:  No CVA tenderness CHEST:  Left mastectomy, left breast implant.   HEART:  PMI not displaced or sustained,S1 and S2 within normal limits, no S3, no S4, no clicks, no rubs, no murmurs ABD:  Flat, positive bowel sounds  normal in frequency in pitch, no bruits, no rebound, no guarding, no midline pulsatile mass, no hepatomegaly, no splenomegaly EXT:  2 plus pulses throughout, no edema, no cyanosis no clubbing SKIN:  No rashes no nodules NEURO:  Cranial nerves II through XII grossly intact, motor grossly intact throughout PSYCH:  Cognitively intact, oriented to person place and time    EKG:  EKG is not ordered today. The ekg ordered 04/07/19 demonstrated  Sinus rhythm, rate 69, leftward axis, intervals within normal limits, no acute ST-T wave changes.   Recent Labs: 04/07/2019: ALT 31; BUN 14; Creatinine, Ser 0.87; Hemoglobin 13.9; Platelets 172; Potassium 3.3; Sodium 141 04/08/2019: TSH 3.674    Lipid Panel    Component Value Date/Time   CHOL 220 (H) 06/13/2009 0800   TRIG 45.0 06/13/2009 0800   TRIG 48 06/17/2006 1039   HDL 73.60 06/13/2009 0800   CHOLHDL 3 06/13/2009 0800   VLDL 9.0 06/13/2009 0800   LDLDIRECT 134.5 06/13/2009 0800      Wt Readings from Last 3 Encounters:  06/03/19 138 lb 9.6 oz (62.9 kg)  05/31/19 137 lb (62.1 kg)  04/29/19 138 lb (62.6 kg)      Other studies Reviewed: Additional studies/ records that were reviewed today include: Event monitor. Review of the above records demonstrates:   Please see elsewhere in the note.     ASSESSMENT AND PLAN:  PALPITATIONS:    She has had some rare and nonsustained SVT and she wants to treat this conservatively as it particularly symptomatic.  No change in therapy.   HTN:  The blood pressure is controlled.  She will continue meds as listed.   CHEST PAIN:  I think this is atypical and she had a 0 calcium score.  No change in therapy.  Current medicines are reviewed at length with the patient today.  The patient does not have concerns regarding medicines.  The following changes have been made:  None  Labs/ tests ordered today include:  None  No orders of the defined types were placed in this encounter.    Disposition:   FU  with me in 12 months.      Signed, Minus Breeding, MD  06/03/2019 8:43 AM    [place]

## 2019-06-03 ENCOUNTER — Ambulatory Visit (INDEPENDENT_AMBULATORY_CARE_PROVIDER_SITE_OTHER): Payer: PRIVATE HEALTH INSURANCE | Admitting: Cardiology

## 2019-06-03 ENCOUNTER — Encounter: Payer: Self-pay | Admitting: Cardiology

## 2019-06-03 ENCOUNTER — Other Ambulatory Visit: Payer: Self-pay

## 2019-06-03 VITALS — BP 125/85 | HR 61 | Temp 97.0°F | Wt 138.6 lb

## 2019-06-03 DIAGNOSIS — R079 Chest pain, unspecified: Secondary | ICD-10-CM

## 2019-06-03 DIAGNOSIS — I77810 Thoracic aortic ectasia: Secondary | ICD-10-CM | POA: Diagnosis not present

## 2019-06-03 DIAGNOSIS — R002 Palpitations: Secondary | ICD-10-CM

## 2019-06-03 DIAGNOSIS — I10 Essential (primary) hypertension: Secondary | ICD-10-CM | POA: Diagnosis not present

## 2019-06-03 NOTE — Patient Instructions (Signed)
Medication Instructions:  Your physician recommends that you continue on your current medications as directed. Please refer to the Current Medication list given to you today.  If you need a refill on your cardiac medications before your next appointment, please call your pharmacy.   Lab work: NONE   Testing/Procedures: NONE  Follow-Up: At [facility], you and your health needs are our priority.  As part of our continuing mission to provide you with exceptional heart care, we have created designated Provider Care Teams.  These Care Teams include your primary Cardiologist (physician) and Advanced Practice Providers (APPs -  Physician Assistants and Nurse Practitioners) who all work together to provide you with the care you need, when you need it. You will need a follow up appointment in 1 year.  Please call our office 2 months in advance to schedule this appointment.  You may see Minus Breeding, MD or one of the following Advanced Practice Providers on your designated Care Team:   Rosaria Ferries, PA-C Jory [facility], DNP, ANP

## 2019-06-11 ENCOUNTER — Ambulatory Visit: Payer: PRIVATE HEALTH INSURANCE | Admitting: Cardiology

## 2019-08-17 ENCOUNTER — Other Ambulatory Visit: Payer: Self-pay

## 2019-08-17 ENCOUNTER — Encounter: Payer: Self-pay | Admitting: Obstetrics & Gynecology

## 2019-08-17 ENCOUNTER — Ambulatory Visit (INDEPENDENT_AMBULATORY_CARE_PROVIDER_SITE_OTHER): Payer: No Typology Code available for payment source

## 2019-08-17 ENCOUNTER — Ambulatory Visit: Payer: No Typology Code available for payment source | Admitting: Obstetrics & Gynecology

## 2019-08-17 VITALS — BP 120/76 | HR 72 | Temp 97.8°F | Resp 12

## 2019-08-17 DIAGNOSIS — Z01419 Encounter for gynecological examination (general) (routine) without abnormal findings: Secondary | ICD-10-CM | POA: Diagnosis not present

## 2019-08-17 DIAGNOSIS — N859 Noninflammatory disorder of uterus, unspecified: Secondary | ICD-10-CM | POA: Diagnosis not present

## 2019-08-17 MED ORDER — ESTRADIOL 10 MCG VA TABS: ORAL_TABLET | VAGINAL | 4 refills | Status: DC

## 2019-08-17 NOTE — Progress Notes (Signed)
73 y.o. G3P3 Married White or Caucasian female here for annual exam.  Doing well.  Family is all being careful so they will hopefully be together over the holidays.  Denies vaginal bleeding.  Ultrasound done today.  Images reviewed. Uterus:  6.4 x 4.0 x 2.9cm Endometrium 1.30mm with fluid still present, minimal increase in amount of fluid today, very small irregularity along posterior wall is present and also unchanged, avascular Ovaries both atrophic Cul de sac:  No free fluid   Had blood work done with Dr. Joylene Draft today.    Has started Zoloft for IBS.  This is prescribed by Dr. Henrene Pastor.  Feels this has helped some.    No LMP recorded. Patient is postmenopausal.          Sexually active: Yes.    The current method of family planning is post menopausal status.    Exercising: Yes.    Smoker:  Former, remote, smoker  Health Maintenance: Pap:  05/17/16 Neg History of abnormal Pap:  no MMG:  02/22/19 Right Breast MMG - BIRADS 1 negative Colonoscopy:  11/17/14 done with Dr. Henrene Pastor BMD: 2016.  Is planning on having another one this year or early next year with Dr. Joylene Draft  TDaP:  UTD with PCP Pneumonia vaccine(s):  completed Shingrix:  Patient has only had the Zostavax.  Is planning on receiving the Shingrix.   Hep C testing: possibly done with PCP Screening Labs: PCP   reports that she quit smoking about 40 years ago. Her smoking use included cigarettes. She has never used smokeless tobacco. She reports current alcohol use of about 14.0 standard drinks of alcohol per week. She reports that she does not use drugs.  Past Medical History:  Diagnosis Date  . cancer    left breast cancer  . Diverticulosis of colon (without mention of hemorrhage)   . Elevated LFTs   . GERD (gastroesophageal reflux disease)   . History of breast cancer 1995  . Hyperlipidemia   . Hypertension 2012    Past Surgical History:  Procedure Laterality Date  . CHOLECYSTECTOMY  09/15/2012   Procedure: LAPAROSCOPIC  CHOLECYSTECTOMY WITH INTRAOPERATIVE CHOLANGIOGRAM;  Surgeon: Haywood Lasso, MD;  Location: [facility];  Service: General;  Laterality: N/A;  . COLONOSCOPY    . MASTECTOMY  1995   left, followed by reconstruction   . UPPER GASTROINTESTINAL ENDOSCOPY      Current Outpatient Medications  Medication Sig Dispense Refill  . ascorbic acid (VITAMIN C) 1000 MG tablet Take by mouth.    . BYSTOLIC 5 MG tablet Take 5 mg by mouth at bedtime.     . Estradiol 10 MCG TABS vaginal tablet 1 pv nightly x 14 days and then twice weekly. 28 tablet 12  . NONFORMULARY OR COMPOUNDED ITEM Vitamin E vaginal suppositories 200u/ml.  One pv two to three times weekly. 36 each 3  . omeprazole (PRILOSEC OTC) 20 MG tablet Take 20 mg by mouth daily.    . rosuvastatin (CRESTOR) 5 MG tablet Take 1 tablet by mouth daily.    . sertraline (ZOLOFT) 50 MG tablet Zoloft 50 mg by mouth ( take one at night ) #30 ; 2 refills (Patient taking differently: 25 mg. Zoloft 50 mg by mouth ( take one at night ) #30 ; 2 refills Takes 25 mg QHS) 30 tablet 2   Current Facility-Administered Medications  Medication Dose Route Frequency Provider Last Rate Last Admin  . methylPREDNISolone acetate (DEPO-MEDROL) injection 40 mg  40 mg Intra-articular Once Hilts,  Legrand Como, MD        Family History  Problem Relation Age of Onset  . Breast cancer Mother 27  . Heart disease Father 74       2 MIs   . Colon cancer Neg Hx   . Stomach cancer Neg Hx   . Esophageal cancer Neg Hx   . Rectal cancer Neg Hx     Review of Systems  All other systems reviewed and are negative.   Exam:   BP 120/76 (BP Location: Right Arm, Patient Position: Sitting, Cuff Size: Normal)   Pulse 72   Temp 97.8 F (36.6 C) (Temporal)   Resp 12       Ht Readings from Last 3 Encounters:  05/31/19 5' 5.5" (1.664 m)  04/29/19 5\' 5"  (1.651 m)  04/16/19 5\' 5"  (1.651 m)    General appearance: alert, cooperative and appears stated age Head: Normocephalic, without obvious  abnormality, atraumatic Neck: no adenopathy, supple, symmetrical, trachea midline and thyroid normal to inspection and palpation Lungs: clear to auscultation bilaterally Breasts: normal appearance, no masses or tenderness on right; left breast implant present, s/p mastectomy, no masses of LAD noted Heart: regular rate and rhythm Abdomen: soft, non-tender; bowel sounds normal; no masses,  no organomegaly Extremities: extremities normal, atraumatic, no cyanosis or edema Skin: Skin color, texture, turgor normal. No rashes or lesions Lymph nodes: Cervical, supraclavicular, and axillary nodes normal. No abnormal inguinal nodes palpated Neurologic: Grossly normal   Pelvic: External genitalia:  no lesions              Urethra:  normal appearing urethra with no masses, tenderness or lesions              Bartholins and Skenes: normal                 Vagina: normal appearing vagina with normal color and discharge, no lesions              Cervix: no lesions              Pap taken: No. Bimanual Exam:  Uterus:  normal size, contour, position, consistency, mobility, non-tender              Adnexa: normal adnexa and no mass, fullness, tenderness               Rectovaginal: Confirms               Anus:  normal sphincter tone, no lesions  Chaperone was present for exam.  A:  Well Woman with normal exam PMP, no HRT H/o breast cancer s/p mastectomy 1995.  Does yearly MMG at [facility]. Vaginal atrophic changes with associated dyspareunia improved with vagifem (did discuss this use with [facility] provider in the past) Endometrial fluid without endometrial cavity found 2/20 with evauation for pelvic pressure (which improved with I.C. diet changes)  P:   Mammogram guidelines reviewed.  She does at [facility] yearly. pap smear neg 2017.  Guidelines reviewed.  Not indicated today and not needed for routine screening. Repeat PUS in 6 months.  D/w pt small increase in size of fluid.  Dilation of cervix in office with  aspiration of fluid and endometrial biopsy discussed.  She declines doing this at this time.  Aware if anything changes with endometrial irregularity and/or fluid amount would highly encourage this (could be done in office or in OR) RF for vagifem 49meq pv twice weekly.  #24/4Rf to pharmacy. Return annually or prn  Less than 10 minutes spent reviewing ultrasound and discussing options for follow-up, additional evaluation in addition to routine gyn examination.

## 2019-08-19 ENCOUNTER — Encounter: Payer: Self-pay | Admitting: Internal Medicine

## 2019-08-19 ENCOUNTER — Ambulatory Visit (INDEPENDENT_AMBULATORY_CARE_PROVIDER_SITE_OTHER): Payer: PRIVATE HEALTH INSURANCE | Admitting: Internal Medicine

## 2019-08-19 VITALS — BP 100/70 | HR 70 | Temp 96.9°F | Ht 65.5 in | Wt 141.4 lb

## 2019-08-19 DIAGNOSIS — K589 Irritable bowel syndrome without diarrhea: Secondary | ICD-10-CM

## 2019-08-19 MED ORDER — SERTRALINE HCL 50 MG PO TABS: 25.0000 mg | ORAL_TABLET | Freq: Every day | ORAL | 3 refills | Status: DC

## 2019-08-19 NOTE — Patient Instructions (Signed)
We have sent the following medications to your pharmacy for you to pick up at your convenience:  Zoloft

## 2019-08-19 NOTE — Progress Notes (Signed)
HISTORY OF PRESENT ILLNESS:  Patricia Flynn is a 73 y.o. female with a history of chronic functional abdominal complaints, health-related anxiety, and prior cholecystectomy.  She presents today for follow-up.  She was last evaluated May 31, 2019 regarding functional dyspepsia and IBS.  See that dictation for details.  She is also been having issues with anxiety.  In late August she was started on Zoloft 25 mg at night.  She was doing better at the time of her last visit.  She presents today for routine follow-up.  She continues to do well.  Minimal complaints of belching.  No heartburn on PPI.  Occasional issues with intestinal gas as manifested by flatus.  No significant abdominal pain to report.  Tolerating medication well  REVIEW OF SYSTEMS:  All non-GI ROS negative unless otherwise stated in the HPI.  Past Medical History:  Diagnosis Date  . cancer    left breast cancer  . Diverticulosis of colon (without mention of hemorrhage)   . Elevated LFTs   . GERD (gastroesophageal reflux disease)   . History of breast cancer 1995  . Hyperlipidemia   . Hypertension 2012    Past Surgical History:  Procedure Laterality Date  . CHOLECYSTECTOMY  09/15/2012   Procedure: LAPAROSCOPIC CHOLECYSTECTOMY WITH INTRAOPERATIVE CHOLANGIOGRAM;  Surgeon: Haywood Lasso, MD;  Location: [facility];  Service: General;  Laterality: N/A;  . COLONOSCOPY    . MASTECTOMY  1995   left, followed by reconstruction   . UPPER GASTROINTESTINAL ENDOSCOPY      Social History [name]  reports that she quit smoking about 40 years ago. Her smoking use included cigarettes. She has never used smokeless tobacco. She reports current alcohol use of about 14.0 standard drinks of alcohol per week. She reports that she does not use drugs.  family history includes Breast cancer (age of onset: 40) in her mother; Heart disease (age of onset: 104) in her father.  No Known Allergies     PHYSICAL EXAMINATION: Vital signs:  BP 100/70 (BP Location: Right Arm, Patient Position: Sitting, Cuff Size: Normal)   Pulse 70   Temp (!) 96.9 F (36.1 C)   Ht 5' 5.5" (1.664 m)   Wt 141 lb 6 oz (64.1 kg)   SpO2 95%   BMI 23.17 kg/m   Constitutional: generally well-appearing, no acute distress Psychiatric: alert and oriented x3, cooperative Eyes: extraocular movements intact, anicteric, conjunctiva pink Mouth: oral pharynx moist, no lesions Neck: supple no lymphadenopathy Cardiovascular: heart regular rate and rhythm, no murmur Lungs: clear to auscultation bilaterally Abdomen: soft, nontender, nondistended, no obvious ascites, no peritoneal signs, normal bowel sounds, no organomegaly Rectal: Omitted Extremities: no clubbing, cyanosis, or lower extremity edema bilaterally Skin: no lesions on visible extremities Neuro: No focal deficits.  Cranial nerves intact  ASSESSMENT:  1.  Functional dyspepsia improved on Zoloft and PPI 2.  IBS.  Improved on Zoloft 3.  Anxiety.  Improved on Zoloft 4.  Normal upper endoscopy April 29, 2019 5.  Normal colonoscopy November 17, 2014 6.  Status post cholecystectomy   PLAN:  1.  Continue omeprazole 20 mg daily.  Medication effects, side effects and risks reviewed 2.  Continue Zoloft 25 mg at night.  Prescription refilled.  Medication effects, side effects, and risks reviewed 3.  Routine office follow-up in 3 months.  Contact the office in the interim for any questions or problems.

## 2019-09-20 ENCOUNTER — Other Ambulatory Visit: Payer: Self-pay

## 2019-09-20 DIAGNOSIS — Z01812 Encounter for preprocedural laboratory examination: Secondary | ICD-10-CM

## 2019-09-21 ENCOUNTER — Other Ambulatory Visit: Payer: Self-pay

## 2019-09-21 DIAGNOSIS — Z01812 Encounter for preprocedural laboratory examination: Secondary | ICD-10-CM

## 2019-09-22 ENCOUNTER — Telehealth: Payer: Self-pay | Admitting: *Deleted

## 2019-09-22 NOTE — Telephone Encounter (Signed)
Called patient to schedule CT Angio Aorta for February. Pt is in [place] she will call when she returns to [place]. Gave patient direct number to call back to CT [phone] to schedule

## 2019-11-26 ENCOUNTER — Telehealth: Payer: Self-pay | Admitting: Cardiology

## 2019-11-26 NOTE — Telephone Encounter (Signed)
Left message regarding appointment for CTA chest aorta scheduled  12/29/19 at 10:30 am ---Patricia Flynn--[address].  Patient to do lab work 1 week prior to appointment.  Will mail information to patient

## 2019-12-06 ENCOUNTER — Telehealth: Payer: Self-pay | Admitting: Obstetrics & Gynecology

## 2019-12-06 MED ORDER — ESTRADIOL 10 MCG VA TABS: ORAL_TABLET | VAGINAL | 4 refills | Status: DC

## 2019-12-06 NOTE — Telephone Encounter (Signed)
Patient is calling regarding estradiol 10 mcg vaginal tablets. Patient stated that the pharmacy is filling it as once weekly instead of twice weekly.

## 2019-12-06 NOTE — Telephone Encounter (Addendum)
Call to pharmacy to clarify RX adjustment. Left message on voice mail at 1720 - store closed- advising adjustment made in Epic from 12 to 24 tablets. Call to patient. Left message advising of update and can call back tomorrow.

## 2019-12-09 ENCOUNTER — Ambulatory Visit: Payer: Self-pay | Admitting: Obstetrics & Gynecology

## 2019-12-23 LAB — BASIC METABOLIC PANEL
BUN/Creatinine Ratio: 17 (ref 12–28)
BUN: 15 mg/dL (ref 8–27)
CO2: 23 mmol/L (ref 20–29)
Calcium: 9.4 mg/dL (ref 8.7–10.3)
Chloride: 102 mmol/L (ref 96–106)
Creatinine, Ser: 0.89 mg/dL (ref 0.57–1.00)
GFR calc Af Amer: 74 mL/min/{1.73_m2} (ref >59)
GFR calc non Af Amer: 64 mL/min/{1.73_m2} (ref >59)
Glucose: 80 mg/dL (ref 65–99)
Potassium: 4.1 mmol/L (ref 3.5–5.2)
Sodium: 137 mmol/L (ref 134–144)

## 2019-12-29 ENCOUNTER — Ambulatory Visit (INDEPENDENT_AMBULATORY_CARE_PROVIDER_SITE_OTHER)
Admission: RE | Admit: 2019-12-29 | Discharge: 2019-12-29 | Disposition: A | Discharge status: Home / Self Care | Payer: PRIVATE HEALTH INSURANCE | Source: Ambulatory Visit | Attending: Cardiology | Admitting: Cardiology

## 2019-12-29 ENCOUNTER — Other Ambulatory Visit: Payer: Self-pay

## 2019-12-29 DIAGNOSIS — I7789 Other specified disorders of arteries and arterioles: Secondary | ICD-10-CM

## 2019-12-29 MED ORDER — IOHEXOL 350 MG/ML SOLN
100.0000 mL | Freq: Once | INTRAVENOUS | Status: AC | PRN
  Administered 2019-12-29: 100 mL via INTRAVENOUS

## 2019-12-30 ENCOUNTER — Ambulatory Visit (INDEPENDENT_AMBULATORY_CARE_PROVIDER_SITE_OTHER): Payer: PRIVATE HEALTH INSURANCE | Admitting: Family Medicine

## 2019-12-30 ENCOUNTER — Encounter: Payer: Self-pay | Admitting: Family Medicine

## 2019-12-30 ENCOUNTER — Encounter: Payer: Self-pay | Admitting: Internal Medicine

## 2019-12-30 ENCOUNTER — Ambulatory Visit: Payer: PRIVATE HEALTH INSURANCE | Admitting: Internal Medicine

## 2019-12-30 VITALS — BP 94/60 | HR 60 | Temp 97.7°F | Ht 65.5 in | Wt 140.4 lb

## 2019-12-30 DIAGNOSIS — M25561 Pain in right knee: Secondary | ICD-10-CM | POA: Diagnosis not present

## 2019-12-30 DIAGNOSIS — K589 Irritable bowel syndrome without diarrhea: Secondary | ICD-10-CM

## 2019-12-30 DIAGNOSIS — M25551 Pain in right hip: Secondary | ICD-10-CM

## 2019-12-30 DIAGNOSIS — R195 Other fecal abnormalities: Secondary | ICD-10-CM

## 2019-12-30 DIAGNOSIS — F419 Anxiety disorder, unspecified: Secondary | ICD-10-CM

## 2019-12-30 MED ORDER — SERTRALINE HCL 50 MG PO TABS: 25.0000 mg | ORAL_TABLET | Freq: Every day | ORAL | 3 refills | Status: DC

## 2019-12-30 MED ORDER — OMEPRAZOLE MAGNESIUM 20 MG PO TBEC: 20.0000 mg | DELAYED_RELEASE_TABLET | Freq: Every day | ORAL | 3 refills | Status: DC

## 2019-12-30 NOTE — Patient Instructions (Signed)
We have sent the following medications to your pharmacy for you to pick up at your convenience:  Omeprazole.  Please follow up in one year  

## 2019-12-30 NOTE — Progress Notes (Signed)
HISTORY OF PRESENT ILLNESS:  Patricia Flynn is a 74 y.o. female with history of chronic functional abdominal complaints, health related anxiety, and prior cholecystectomy.  She was last seen in the office August 19, 2019.  See that dictation for details.  Symptoms were improved on low-dose Zoloft and omeprazole.  Follow-up in 3 months recommended.  She follows up at this time.  She is pleased to report that she is doing well.  Her only complaint is that of loose stools after vigorous exercise.  This is stable and has been going on for several years.  No problems with reflux.  Overall good sense of wellbeing.  No medication side effects to report.  She does have questions regarding long-term side effects, if any.  She has completed her Covid vaccination series  REVIEW OF SYSTEMS:  All non-GI ROS negative entirely.  Past Medical History:  Diagnosis Date  . cancer    left breast cancer  . Diverticulosis of colon (without mention of hemorrhage)   . Elevated LFTs   . GERD (gastroesophageal reflux disease)   . History of breast cancer 1995  . Hyperlipidemia   . Hypertension 2012    Past Surgical History:  Procedure Laterality Date  . CHOLECYSTECTOMY  09/15/2012   Procedure: LAPAROSCOPIC CHOLECYSTECTOMY WITH INTRAOPERATIVE CHOLANGIOGRAM;  Surgeon: Haywood Lasso, MD;  Location: [facility];  Service: General;  Laterality: N/A;  . COLONOSCOPY    . MASTECTOMY  1995   left, followed by reconstruction   . UPPER GASTROINTESTINAL ENDOSCOPY      Social History JADIAH BARNABA  reports that she quit smoking about 41 years ago. Her smoking use included cigarettes. She has never used smokeless tobacco. She reports current alcohol use of about 14.0 standard drinks of alcohol per week. She reports that she does not use drugs.  family history includes Breast cancer (age of onset: 20) in her mother; Heart disease (age of onset: 60) in her father.  No Known Allergies     PHYSICAL EXAMINATION: Vital  signs: BP 94/60 (BP Location: Right Arm, Patient Position: Sitting, Cuff Size: Normal)   Pulse 60   Temp 97.7 F (36.5 C)   Ht 5' 5.5" (1.664 m)   Wt 140 lb 6 oz (63.7 kg)   SpO2 96%   BMI 23.00 kg/m   Constitutional: generally well-appearing, no acute distress Psychiatric: alert and oriented x3, cooperative Eyes: extraocular movements intact, anicteric, conjunctiva pink Mouth: oral pharynx moist, no lesions Neck: supple no lymphadenopathy Cardiovascular: heart regular rate and rhythm, no murmur Lungs: clear to auscultation bilaterally Abdomen: soft, nontender, nondistended, no obvious ascites, no peritoneal signs, normal bowel sounds, no organomegaly Rectal: Omitted Extremities: no clubbing, cyanosis, or lower extremity edema bilaterally Skin: no lesions on visible extremities Neuro: No focal deficits.  Cranial nerves intact  ASSESSMENT:  1.  Functional dyspepsia.  Improved on Zoloft and PPI 2.  IBS.  Improved on Zoloft 3.  Anxiety.  Improved on Zoloft 4.  Normal upper endoscopy August 2020 5.  Normal colonoscopy March 2016 6.  Status post cholecystectomy   PLAN:  1.  Continue Zoloft 25 mg at night 2.  Continue omeprazole 20 mg daily 3.  Routine office follow-up 1 year.  Contact the office in the interim for any questions or problems

## 2019-12-30 NOTE — Progress Notes (Signed)
Office Visit Note   Patient: Patricia Flynn           Date of Birth: [birth year]           MRN: [id number] Visit Date: 12/30/2019 Requested by: Crist Infante, MD [address] [place],  [place] [address] PCP: Crist Infante, MD  Subjective: Chief Complaint  Patient presents with  . Right Hip - Pain    Pain posterior hip/thigh with driving. Has also noticed the right lower back muscle being more prominent than before. Has known scoliosis.   . Right Knee - Pain    Pain and swelling in the knee. Tightness in the posterior knee with squatting. No problems with walking/stairs. Started icing the knee and wearing a knee sleeve with exercise this week - helps.    HPI: He is here with right hip and knee pain.  She recently returned from [place].  She is doing a lot of walking on hills instead of flat land.  Her knee started to swell recently.  It does not hurt unless walking up hills.  Her right posterior hip bothers her primarily when sitting for long time.  She has a history of scoliosis.  She is not having any sciatica symptoms or groin pain.  No pain when up and walking.               ROS:   All other systems were reviewed and are negative.  Objective: Vital Signs: There were no vitals taken for this visit.  Physical Exam:  General:  Alert and oriented, in no acute distress. Pulm:  Breathing unlabored. Psy:  Normal mood, congruent affect.  Right hip: She has thoracolumbar scoliosis with right leg slightly shorter than the left.  No significant pain with internal hip rotation.  She has some tenderness in the gluteus medius region and in the quadratus lumborum along with some muscular tightness. Right knee: 1+ effusion with no warmth.  1+ patellofemoral crepitus, no pain with patella compression.  Slight tenderness on the medial and lateral joint lines.  Full range of motion.   Imaging: No results found.  Assessment & Plan: 1.  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  Aspiration and injection if symptoms persist.  2.  Right posterior hip pain, possibly muscular related to scoliosis and leg length discrepancy -3/16 inch heel lift given for the right leg. -Consider physical therapy if symptoms persist.  Lumbar x-rays and MRI scan would be another consideration.     Procedures: No procedures performed  No notes on file     PMFS History: Patient Active Problem List   Diagnosis Date Noted  . Aortic root dilatation ([place]) 06/01/2019  . Palpitations 04/12/2019  . S/P left mastectomy 02/22/2019  . Dryness of vagina 01/04/2014  . Essential hypertension 12/13/2010  . Chest pain 11/30/2010  . HYPERLIPIDEMIA 06/23/2006  . BREAST CANCER, HX OF 06/23/2006   Past Medical History:  Diagnosis Date  . cancer    left breast cancer  . Diverticulosis of colon (without mention of hemorrhage)   . Elevated LFTs   . GERD (gastroesophageal reflux disease)   . History of breast cancer 1995  . Hyperlipidemia   . Hypertension 2012    Family History  Problem Relation Age of Onset  . Breast cancer Mother 29  . Heart disease Father 31       2 MIs   . Colon cancer Neg Hx   . Stomach cancer Neg Hx   .  Esophageal cancer Neg Hx   . Rectal cancer Neg Hx     Past Surgical History:  Procedure Laterality Date  . CHOLECYSTECTOMY  09/15/2012   Procedure: LAPAROSCOPIC CHOLECYSTECTOMY WITH INTRAOPERATIVE CHOLANGIOGRAM;  Surgeon: Haywood Lasso, MD;  Location: [facility];  Service: General;  Laterality: N/A;  . COLONOSCOPY    . MASTECTOMY  1995   left, followed by reconstruction   . UPPER GASTROINTESTINAL ENDOSCOPY     Social History   Occupational History  . Not on file  Tobacco Use  . Smoking status: Former Smoker    Types: Cigarettes    Quit date: 09/02/1978    Years since quitting: 41.3  . Smokeless tobacco: Never Used  Substance and Sexual Activity  . Alcohol use: Yes    Alcohol/week: 14.0 standard drinks    Types: 14 Glasses of  wine per week    Comment: 2  0.4 ounces glass red OR WHITE wine per day  . Drug use: No  . Sexual activity: Yes    Partners: Male    Birth control/protection: Post-menopausal

## 2020-01-12 ENCOUNTER — Telehealth: Payer: Self-pay

## 2020-01-12 ENCOUNTER — Telehealth: Payer: Self-pay | Admitting: Obstetrics & Gynecology

## 2020-01-12 NOTE — Telephone Encounter (Signed)
Spoke with pt. Pt wanting to reschedule PUS to earlier date.  Pt to have 6 month f/u PUS per reviewed notes on 08/17/19 per Dr [name] Heck.   Repeat PUS in 6 months.  D/w pt small increase in size of fluid  Pt states having no problems currently, but wanting to reschedule due to personal schedule conflict. Advised will update Dr [name] Heck.  Pt rescheduled to 5/20 at 2:00 pm. Pt agreeable to date and time.   Routing to Dr [name] Heck for review and update.  Encounter closed  Cc: Rosa for precert. Orders placed on 08/2019.

## 2020-01-12 NOTE — Telephone Encounter (Signed)
Patient is calling in regards to wanting to reschedule ultrasound appointment for (02/08/20) to an earlier appointment of (01/25/20).

## 2020-01-12 NOTE — Telephone Encounter (Signed)
Call placed to convey benefits. Spoke with the patient and conveyed the benefits. Patient understands/agreeable with the benefits. Patient is aware of the cancellation policy. Appointment scheduled 01/20/20.

## 2020-01-19 ENCOUNTER — Other Ambulatory Visit: Payer: Self-pay

## 2020-01-20 ENCOUNTER — Encounter: Payer: Self-pay | Admitting: Obstetrics & Gynecology

## 2020-01-20 ENCOUNTER — Ambulatory Visit (INDEPENDENT_AMBULATORY_CARE_PROVIDER_SITE_OTHER): Payer: No Typology Code available for payment source

## 2020-01-20 ENCOUNTER — Ambulatory Visit: Payer: No Typology Code available for payment source | Admitting: Obstetrics & Gynecology

## 2020-01-20 VITALS — BP 110/68 | HR 68 | Temp 97.5°F | Ht 66.0 in | Wt 139.0 lb

## 2020-01-20 DIAGNOSIS — N859 Noninflammatory disorder of uterus, unspecified: Secondary | ICD-10-CM | POA: Diagnosis not present

## 2020-01-20 NOTE — Progress Notes (Signed)
74 y.o. G3P3 Married White or Caucasian female here for pelvic ultrasound due to recheck endometrial fluid noted on PUS.  Denies vaginal bleeding.  Continues to have GI episodes that occur without any warning and last a few days.  Continues to be followed by GI.    No LMP recorded. Patient is postmenopausal.  Contraception: PMP  Findings:  UTERUS: 5.9 x 3.1 x 2.5cm  EMS: 1.58mm ADNEXA: Left ovary: 1.1 x 0.6 x 0.7cm       Right ovary: 0.9 x 0.7 x 0.5cm CUL DE SAC: no free fluid  Discussion:  Ultrasonographer supervised.  Images reviewed with pt.  Images compared to prior ultrasound.  Endometrial fluid measures 6 x 42mm today.  Prior measurement was in AP only and today's result is no different.  Fluid appears avascular.  Given these findings, I think it is safe to monitor conservatively and stop watching with ultrasound.  Pt would like to consider having one more so will plan to repeat in one year.  She knows to call with any changes in symptoms or particularly if she has any vaginal bleeding.  Pt comfortable with plan.  Assessment:  Endometrial fluid in pt with thin endometrium and no other gyn symptoms  Plan:  Plan to repeat PUS in 1 year.  Future order placed.

## 2020-02-08 ENCOUNTER — Other Ambulatory Visit: Payer: No Typology Code available for payment source

## 2020-02-08 ENCOUNTER — Other Ambulatory Visit: Payer: No Typology Code available for payment source | Admitting: Obstetrics & Gynecology

## 2020-04-25 ENCOUNTER — Other Ambulatory Visit: Payer: Self-pay

## 2020-04-25 ENCOUNTER — Ambulatory Visit (INDEPENDENT_AMBULATORY_CARE_PROVIDER_SITE_OTHER): Payer: PRIVATE HEALTH INSURANCE

## 2020-04-25 ENCOUNTER — Encounter: Payer: Self-pay | Admitting: Family Medicine

## 2020-04-25 ENCOUNTER — Ambulatory Visit (INDEPENDENT_AMBULATORY_CARE_PROVIDER_SITE_OTHER): Payer: PRIVATE HEALTH INSURANCE | Admitting: Family Medicine

## 2020-04-25 DIAGNOSIS — M25561 Pain in right knee: Secondary | ICD-10-CM | POA: Diagnosis not present

## 2020-04-25 DIAGNOSIS — M79601 Pain in right arm: Secondary | ICD-10-CM

## 2020-04-25 NOTE — Progress Notes (Signed)
Office Visit Note   Patient: Patricia Flynn           Date of Birth: [birth year]           MRN: [id number] Visit Date: 04/25/2020 Requested by: Crist Infante, MD [address] [place],  [place] [address] PCP: Crist Infante, MD  Subjective: Chief Complaint  Patient presents with  . Right Knee - Pain    Knee continues to swell some. She wants to be sure she is not doing any damage to it, with her active life. Ice helps.  . Right Upper Arm - Pain    Pain in upper arm with certain arm movements x 6 months. Has occurred with exercising with weights, lifting the cover off the bed, ironing, etc. Icing the arm helps.    HPI: 74 year old female presenting to clinic today with concerns of right knee and right shoulder pain.  Patient states that this pain has been ongoing for several months, and her primary concern today is that she "wants to make sure I am not doing any damage to myself."  She states that she is very active, walking several miles a day, every day.  She also has a trainer in the gym, where she focuses mainly on upper body resistance training.  She plays golf regularly, and enjoys walking the for 18 holes.  Patient describes that squatting causes significant pain in her right knee that "takes my breath away."  Her pain is primarily located on either side of her right knee, and not centralized beneath the patella.  She denies any mechanical catching or locking within the knee.  She denies any new trauma.  Per her shoulder, she similarly states that this is been ongoing for several months.  She cannot recall any trauma at the onset of her shoulder pain.  She denies any pain with overhead exercises, and states that her pain is worse when lifting her arm out to the side and then straightening her elbow.  Specifically, she states her pain is bad with lifting her blankets off in the morning.  Again she states that "I just want to make sure I am not going to do any damage that will slow me down."                ROS:   All other systems were reviewed and are negative.  Objective: Vital Signs: There were no vitals taken for this visit.  Physical Exam:  General:  Alert and oriented, in no acute distress. Cardiac: Appears well perfused. Pulm:  Breathing unlabored. Psy:  Normal mood, congruent affect. Skin: Overlying skin with no rashes, bruising, or other skin lesions.  Musculoskeletal: Normal gait.  Bilateral knees with no varus or valgus deformity. Right knee:  Mild effusion appreciated.  Mild patellar crepitus with knee flexion and extension, negative J sign.  No significant tenderness to palpation along medial or lateral joint lines.  Supine exam:  Negative anterior and posterior drawer.  No pain or laxity with varus or valgus stress across the knee.  Meniscal exam:  McMurray's negative.  Special tests:  Ober's demonstrates appropriate IT band flexibility, with no significant tenderness over the greater trochanter.  Does have tenderness to palpation over the TFL on the right.  Nobles compression negative.   Right shoulder:  Symmetrical muscle mass.  Shoulder with full range of motion.  Tenderness to palpation along right deltoid musculature, which is most significant at deltoid insertion along humerus.  Additional tenderness to palpation in subacromial  area.  Empty can with good strength, no significant pain.  Belly press and liftoff with full strength, no pain.  Endorses mild pain with Hawkins.    Imaging: Knee x-rays:  Mild degenerative changes appreciated in bilateral knees, right worse than left-as evidenced by an early bone spur formation at medial and lateral joint lines, as well as subpatellar bone spurring.   Right shoulder: No significant degenerative changes appreciated.   Assessment & Plan: 74 year old very active female with concerns of right knee and right shoulder pain.  Primarily, patient is seeking reassurance that she is not going to do further damage  to herself with her current exercise routine.  -X-rays reviewed with patient, and suspect mild degenerative changes appreciated on x-ray are a source of her knee pain.  -Shoulder pain likely due to deltoid tendinitis versus subacromial bursitis, patient is agreeable to a course of physical therapy to help improve the symptoms.  -Encourage patient to continue walking, and golf, as these low impact exercises are unlikely to cause intrinsic damage to her joints.  -Patient should avoid deep squatting exercises, or repetitive lunging, for overall ongoing knee health.  -Return to clinic if symptoms worsen or fail to improve.  -Patient had no further questions or concerns at the completion of today's visit.      Procedures: No procedures performed  No notes on file     PMFS History: Patient Active Problem List   Diagnosis Date Noted  . Aortic root dilatation ([place]) 06/01/2019  . Palpitations 04/12/2019  . S/P left mastectomy 02/22/2019  . Dryness of vagina 01/04/2014  . Essential hypertension 12/13/2010  . Chest pain 11/30/2010  . HYPERLIPIDEMIA 06/23/2006  . BREAST CANCER, HX OF 06/23/2006   Past Medical History:  Diagnosis Date  . cancer    left breast cancer  . Diverticulosis of colon (without mention of hemorrhage)   . Elevated LFTs   . GERD (gastroesophageal reflux disease)   . History of breast cancer 1995  . Hyperlipidemia   . Hypertension 2012    Family History  Problem Relation Age of Onset  . Breast cancer Mother 31  . Heart disease Father 7       2 MIs   . Colon cancer Neg Hx   . Stomach cancer Neg Hx   . Esophageal cancer Neg Hx   . Rectal cancer Neg Hx     Past Surgical History:  Procedure Laterality Date  . CHOLECYSTECTOMY  09/15/2012   Procedure: LAPAROSCOPIC CHOLECYSTECTOMY WITH INTRAOPERATIVE CHOLANGIOGRAM;  Surgeon: Haywood Lasso, MD;  Location: [facility];  Service: General;  Laterality: N/A;  . COLONOSCOPY    . MASTECTOMY  1995   left, followed by  reconstruction   . UPPER GASTROINTESTINAL ENDOSCOPY     Social History   Occupational History  . Not on file  Tobacco Use  . Smoking status: Former Smoker    Types: Cigarettes    Quit date: 09/02/1978    Years since quitting: 41.6  . Smokeless tobacco: Never Used  Vaping Use  . Vaping Use: Never used  Substance and Sexual Activity  . Alcohol use: Yes    Alcohol/week: 14.0 standard drinks    Types: 14 Glasses of wine per week    Comment: 2  0.4 ounces glass red OR WHITE wine per day  . Drug use: No  . Sexual activity: Yes    Partners: Male    Birth control/protection: Post-menopausal

## 2020-04-25 NOTE — Progress Notes (Signed)
I saw and examined the patient with Dr. Elouise Munroe and agree with assessment and plan as outlined.    Right lateral shoulder and right anterolateral knee pain.    Shoulder hurts with lateral and overhead reach.  Tender in deltoid area.  Rotator cuff intact with no pain.  Knee bothers her mainly with squatting.  Minimal pain with palpation today.  X-Rays of shoulder reveal AC DJD.  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  Will try glucosamine, PT, avoidance of squatting.

## 2020-05-24 ENCOUNTER — Other Ambulatory Visit: Payer: Self-pay

## 2020-05-24 ENCOUNTER — Encounter: Payer: Self-pay | Admitting: Physical Therapy

## 2020-05-24 ENCOUNTER — Ambulatory Visit: Payer: PRIVATE HEALTH INSURANCE | Admitting: Physical Therapy

## 2020-05-24 DIAGNOSIS — R600 Localized edema: Secondary | ICD-10-CM

## 2020-05-24 DIAGNOSIS — M25561 Pain in right knee: Secondary | ICD-10-CM

## 2020-05-24 DIAGNOSIS — G8929 Other chronic pain: Secondary | ICD-10-CM

## 2020-05-24 DIAGNOSIS — M25511 Pain in right shoulder: Secondary | ICD-10-CM | POA: Diagnosis not present

## 2020-05-24 DIAGNOSIS — M6281 Muscle weakness (generalized): Secondary | ICD-10-CM

## 2020-05-24 NOTE — Therapy (Signed)
[facility] [address] [place], [place], [address] Phone: [phone]   Fax:  [phone]  Physical Therapy Evaluation  Patient Details  Name: Patricia Flynn MRN: [id number] Date of Birth: 1946/08/11 Referring Provider (PT): Eunice Blase, MD   Encounter Date: 05/24/2020   PT End of Session - 05/24/20 1617    Visit Number 1    Number of Visits 12    Date for PT Re-Evaluation 07/05/20    PT Start Time 5852    PT Stop Time 1435    PT Time Calculation (min) 50 min    Activity Tolerance Patient tolerated treatment well    Behavior During Therapy [facility] for tasks assessed/performed           Past Medical History:  Diagnosis Date  . cancer    left breast cancer  . Diverticulosis of colon (without mention of hemorrhage)   . Elevated LFTs   . GERD (gastroesophageal reflux disease)   . History of breast cancer 1995  . Hyperlipidemia   . Hypertension 2012    Past Surgical History:  Procedure Laterality Date  . CHOLECYSTECTOMY  09/15/2012   Procedure: LAPAROSCOPIC CHOLECYSTECTOMY WITH INTRAOPERATIVE CHOLANGIOGRAM;  Surgeon: Haywood Lasso, MD;  Location: [place];  Service: General;  Laterality: N/A;  . COLONOSCOPY    . MASTECTOMY  1995   left, followed by reconstruction   . UPPER GASTROINTESTINAL ENDOSCOPY      There were no vitals filed for this visit.    Subjective Assessment - 05/24/20 1355    Subjective states that this pain has been ongoing for at least 9 months, and her primary concern today is that she "wants to make sure I am not doing any damage to myself."  She states that she is very active, walking several miles a day, every day.  She also has a trainer in the gym, where she focuses mainly on upper body resistance training.  She plays golf regularly, and enjoys walking the for 18 holes. Pain with squatting. She denies any mechanical catching or locking within the knee. Per her shoulder, she similarly states that this is been  ongoing for several months.  She cannot recall any trauma at the onset of her shoulder pain.  She denies any pain with overhead exercises, and states that her pain is worse when lifting her arm out to the side and then straightening her elbow.  Specifically, she states her pain is bad with lifting her blankets off in the morning.    How long can you walk comfortably? can walk 2 miles, sometimes has pain with this    Diagnostic tests Imaging: XR shows "Mild degenerative changes appreciated in bilateral knees, right worse than left-as evidenced by an early bone spur formation at medial and lateral joint lines, as well as subpatellar bone spurring. Right shoulder: No significant degenerative changes appreciated. "    Patient Stated Goals make sure it doesnt get worse.    Currently in Pain? Yes    Pain Score --   no pain at rest, 9/10 pain with squatting   Pain Location Knee    Pain Orientation Right    Pain Descriptors / Indicators Tightness    Pain Type Chronic pain    Pain Radiating Towards denies    Pain Onset More than a month ago    Pain Frequency Intermittent    Aggravating Factors  squatting    Pain Relieving Factors rest    Multiple Pain Sites Yes  Pain Score 0   pain is intermittent and varries, no pain today   Pain Location Shoulder              [facility] PT Assessment - 05/24/20 0001      Assessment   Medical Diagnosis M25.561 (ICD-10-CM) - Right knee pain, unspecified chronicity, M79.601 (ICD-10-CM) - Right arm pain    Referring Provider (PT) Hilts, Michael, MD    Onset Date/Surgical Date --   9 month onset of pain   Next MD Visit PRN    Prior Therapy none      Precautions   Precautions None      Balance Screen   Has the patient fallen in the past 6 months No    Has the patient had a decrease in activity level because of a fear of falling?  No    Is the patient reluctant to leave their home because of a fear of falling?  No      Home Film/video editor residence      Prior Function   Level of Independence Independent    Leisure plays golf, yoga      ROM / Strength   AROM / PROM / Strength AROM;Strength      AROM   Overall AROM Comments bilat shoulder ROM WFL, Lt knee ROM WNL    AROM Assessment Site Knee    Right/Left Knee Right;Left    Right Knee Extension 3    Right Knee Flexion 135    Left Knee Extension 0    Left Knee Flexion 140      Strength   Strength Assessment Site Shoulder;Knee;Hip    Right/Left Shoulder Right    Right Shoulder Flexion 4+/5    Right Shoulder ABduction 4+/5    Right Shoulder Internal Rotation 5/5    Right Shoulder External Rotation 4/5    Right/Left Hip Right    Right Hip Flexion 4+/5    Right Hip Extension 4-/5    Right Hip ABduction 4-/5    Right/Left Knee Right;Left    Right Knee Flexion 5/5    Right Knee Extension 4+/5    Left Knee Flexion 5/5    Left Knee Extension 5/5      Palpation   Palpation comment TTP over IT band with increased thickness of IT band noted, denies pain with palpation to Rt shoulder      Special Tests   Other special tests inconclusive impingment testing: +empty can test, + yocum test, negative hawkins kennedy. knee valgus on Rt with step down test not present on left.  Tightness noted Rt worse than Lt knee for IT band, H.S, quad. SLS 5 sec avg on Lt, 3 sec avg on Rt                      Objective measurements completed on examination: See above findings.               PT Education - 05/24/20 1617    Education Details HEP, POC    Person(s) Educated Patient    Methods Explanation;Demonstration;Verbal cues;Handout    Comprehension Verbalized understanding;Returned demonstration               PT Long Term Goals - 05/24/20 1624      PT LONG TERM GOAL #1   Title Pt will be I and compliant with HEP    Time 6    Period Weeks  Status New    Target Date 07/05/20      PT LONG TERM GOAL #2   Title Pt will improve Rt hip  strength to 4+ and Rt quad strength 5 MMT to improve function.    Time 6    Period Weeks    Status New      PT LONG TERM GOAL #3   Title Pt will improve Rt knee/hip ROM to WNL    Time 6    Period Weeks    Status New      PT LONG TERM GOAL #4   Title Pt will report less overall pain with squatting and stairs.    Baseline 9 when she squats    Time 6    Period Weeks    Status New                  Plan - 05/24/20 1618    Clinical Impression Statement Pt presents with signs and symptoms consistent with chronic Rt knee pain/IT band syndrome, and chronic Rt shoulder pain/impingment/RTC tendonitis. She will benefit from skilled PT to address her functional deficits with pain with squatting, Rt quad/hip weakness and tightness, and Rt RTC weakness.    Personal Factors and Comorbidities Comorbidity 1    Comorbidities breast CA    Examination-Activity Limitations Bend;Squat;Lift    Examination-Participation Restrictions Cleaning;Laundry;Yard Work;Other   golf   Stability/Clinical Decision Making Stable/Uncomplicated    Clinical Decision Making Low    Rehab Potential Good    PT Frequency 2x / week   1-2   PT Duration 6 weeks    PT Treatment/Interventions ADLs/Self Care Home Management;Cryotherapy;Electrical Stimulation;Iontophoresis 4mg /ml Dexamethasone;Moist Heat;Ultrasound;Therapeutic activities;Therapeutic exercise;Neuromuscular re-education;Manual techniques;Balance training;Passive range of motion;Dry needling;Joint Manipulations;Vasopneumatic Device;Taping    PT Next Visit Plan review and update HEP PRN, wants to be able to squat without pain, needs Rt hip/quad strength and flexibility, consider modalaties PRN for pain    PT Home Exercise Plan Access Code: [email or web address]    Consulted and Agree with Plan of Care Patient           Patient will benefit from skilled therapeutic intervention in order to improve the following deficits and impairments:  Decreased activity tolerance,  Decreased mobility, Decreased range of motion, Decreased strength, Increased edema, Increased muscle spasms, Impaired flexibility, Pain  Visit Diagnosis: Chronic pain of right knee  Chronic right shoulder pain  Muscle weakness (generalized)  Localized edema     Problem List Patient Active Problem List   Diagnosis Date Noted  . Aortic root dilatation ([place]) 06/01/2019  . Palpitations 04/12/2019  . S/P left mastectomy 02/22/2019  . Dryness of vagina 01/04/2014  . Essential hypertension 12/13/2010  . Chest pain 11/30/2010  . HYPERLIPIDEMIA 06/23/2006  . BREAST CANCER, HX OF 06/23/2006    Silvestre Mesi 05/24/2020, 4:27 PM  [facility] [address] [place], [place], [address] Phone: [phone]   Fax:  [phone]  Name: Patricia Flynn MRN: [id number] Date of Birth: Sep 29, 1945

## 2020-05-24 NOTE — Patient Instructions (Signed)
Access Code: [email or web address] URL: [email or web address] Date: 05/24/2020 Prepared by: Elsie Ra  Exercises Supine ITB Stretch with Strap - 2 x daily - 6 x weekly - 3 reps - 30 hold Supine Quadriceps Stretch with Strap on Table - 2 x daily - 6 x weekly - 3 reps - 30 hold Seated Hamstring Stretch - 2 x daily - 6 x weekly - 3 reps - 1 sets - 30 hold Sidelying Hip Abduction - 2 x daily - 6 x weekly - 10 reps - 1-3 sets Standing Partial Lunge - 2 x daily - 6 x weekly - 2-3 sets - 10 reps Sit to Stand without Arm Support - 2 x daily - 6 x weekly - 10 reps - 1-2 sets Prone Single Arm Shoulder Y - 2 x daily - 6 x weekly - 3 sets - 10 reps Prone Shoulder Horizontal Abduction - 2 x daily - 3-4 x weekly - 10 reps - 2-3 sets - 3 sec hold Standing Shoulder External Rotation with Resistance - 2 x daily - 6 x weekly - 10 reps - 1-3 sets

## 2020-06-04 DIAGNOSIS — Z7189 Other specified counseling: Secondary | ICD-10-CM | POA: Insufficient documentation

## 2020-06-04 NOTE — Progress Notes (Signed)
Cardiology Office Note   Date:  06/05/2020   ID:  MERAL GEISSINGER, DOB 06-10-1946, MRN [id number]  PCP:  Crist Infante, MD  Cardiologist:   Minus Breeding, MD Referring:  Crist Infante, MD   No chief complaint on file.     History of Present Illness: Patricia Flynn is a 74 y.o. female who presents was referred by Crist Infante, MD for evaluation of palpitations.   She has no past history other than HTN and a negative perfusion study 2012.      I sent her for a CT which demonstrated no coronary calcium although there was mild aortic dilatation.  An echo was done and there were no significant abnormalities.  EF was low normal.  She had a monitor which demonstrated some paroxysmal atrial tachycardia but no atrial fibrillation.  SVT was nonsustained.   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    Since I last saw her she has done well.  She denies any cardiovascular symptoms.  She exercises routinely.  She does not feel the palpitations that she was having previously.  She thinks some of this is related to reflux or esophageal problems.  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   Past Medical History:  Diagnosis Date  . cancer    left breast cancer  . Diverticulosis of colon (without mention of hemorrhage)   . Elevated LFTs   . GERD (gastroesophageal reflux disease)   . History of breast cancer 1995  . Hyperlipidemia   . Hypertension 2012    Past Surgical History:  Procedure Laterality Date  . CHOLECYSTECTOMY  09/15/2012   Procedure: LAPAROSCOPIC CHOLECYSTECTOMY WITH INTRAOPERATIVE CHOLANGIOGRAM;  Surgeon: Haywood Lasso, MD;  Location: [place];  Service: General;  Laterality: N/A;  . COLONOSCOPY    . MASTECTOMY  1995   left, followed by reconstruction   . UPPER GASTROINTESTINAL ENDOSCOPY       Current Outpatient Medications  Medication Sig Dispense Refill  . ascorbic acid (VITAMIN C) 1000 MG tablet Take 1,000  mg by mouth daily.     Marland Kitchen BYSTOLIC 5 MG tablet Take 2.5 mg by mouth at bedtime.     . Estradiol 10 MCG TABS vaginal tablet 1 pv twice weekly 24 tablet 4  . NASCOBAL 500 MCG/0.1ML SOLN Place into both nostrils once a week.    Marland Kitchen omeprazole (PRILOSEC OTC) 20 MG tablet Take 1 tablet (20 mg total) by mouth daily. 90 tablet 3  . rosuvastatin (CRESTOR) 5 MG tablet Take 1 tablet by mouth daily.    . sertraline (ZOLOFT) 50 MG tablet Take 0.5 tablets (25 mg total) by mouth at bedtime. 90 tablet 3   Current Facility-Administered Medications  Medication Dose Route Frequency Provider Last Rate Last Admin  . methylPREDNISolone acetate (DEPO-MEDROL) injection 40 mg  40 mg Intra-articular Once Hilts, Michael, MD        Allergies:   Patient has no known allergies.     ROS:  Please see the history of present illness.   Otherwise, review of systems are positive for none.   All other systems are reviewed and negative.    PHYSICAL EXAM: VS:  BP 117/83   Pulse (!) 57   Ht 5\' 6"  (1.676 m)   Wt 141 lb (64 kg)   SpO2 97%   BMI 22.76 kg/m  , BMI Body mass index is 22.76 kg/m. GENERAL:  Well  appearing NECK:  No jugular venous distention, waveform within normal limits, carotid upstroke brisk and symmetric, no bruits, no thyromegaly LUNGS:  Clear to auscultation bilaterally CHEST:  Unremarkable HEART:  PMI not displaced or sustained,S1 and S2 within normal limits, no S3, no S4, no clicks, no rubs, no murmurs ABD:  Flat, positive bowel sounds normal in frequency in pitch, no bruits, no rebound, no guarding, no midline pulsatile mass, no hepatomegaly, no splenomegaly EXT:  2 plus pulses throughout, no edema, no cyanosis no clubbing  EKG:  EKG is not  ordered today. The ekg ordered 04/07/19 demonstrated  Sinus rhythm, rate 57, leftward axis, intervals within normal limits, no acute ST-T wave changes.   Recent Labs: 12/22/2019: BUN 15; Creatinine, Ser 0.89; Potassium 4.1; Sodium 137    Lipid Panel      Component Value Date/Time   CHOL 220 (H) 06/13/2009 0800   TRIG 45.0 06/13/2009 0800   TRIG 48 06/17/2006 1039   HDL 73.60 06/13/2009 0800   CHOLHDL 3 06/13/2009 0800   VLDL 9.0 06/13/2009 0800   LDLDIRECT 134.5 06/13/2009 0800      Wt Readings from Last 3 Encounters:  06/05/20 141 lb (64 kg)  01/20/20 139 lb (63 kg)  12/30/19 140 lb 6 oz (63.7 kg)      Other studies Reviewed: Additional studies/ records that were reviewed today include: CT Review of the above records demonstrates:   Please see elsewhere in the note.     ASSESSMENT AND PLAN:  PALPITATIONS:   She does not feel these.  No change in therapy.  HTN:  The blood pressure is well controlled.  She'll continue the meds as listed.   ASCENDING AORTIC ENLARGEMENT: This was 4.0 cm.  I will follow this with another CT aortogram in April 2023.  COVID EDUCATION: She has had her vaccine.  Current medicines are reviewed at length with the patient today.  The patient does not have concerns regarding medicines.  The following changes have been made:  None  Labs/ tests ordered today include:  None  Orders Placed This Encounter  Procedures  . CT ANGIO CHEST AORTA W/CM & OR WO/CM  . Basic metabolic panel  . EKG 12-Lead     Disposition:   FU with me in 12 months.      Signed, Minus Breeding, MD  06/05/2020 1:20 PM    [facility]

## 2020-06-05 ENCOUNTER — Ambulatory Visit (INDEPENDENT_AMBULATORY_CARE_PROVIDER_SITE_OTHER): Payer: PRIVATE HEALTH INSURANCE | Admitting: Cardiology

## 2020-06-05 ENCOUNTER — Encounter: Payer: Self-pay | Admitting: Cardiology

## 2020-06-05 ENCOUNTER — Other Ambulatory Visit: Payer: Self-pay

## 2020-06-05 VITALS — BP 117/83 | HR 57 | Ht 66.0 in | Wt 141.0 lb

## 2020-06-05 DIAGNOSIS — Z7189 Other specified counseling: Secondary | ICD-10-CM | POA: Diagnosis not present

## 2020-06-05 DIAGNOSIS — I10 Essential (primary) hypertension: Secondary | ICD-10-CM

## 2020-06-05 DIAGNOSIS — R002 Palpitations: Secondary | ICD-10-CM

## 2020-06-05 DIAGNOSIS — I712 Thoracic aortic aneurysm, without rupture, unspecified: Secondary | ICD-10-CM

## 2020-06-05 DIAGNOSIS — R072 Precordial pain: Secondary | ICD-10-CM

## 2020-06-05 NOTE — Patient Instructions (Signed)
Medication Instructions:   no changes  *If you need a refill on your cardiac medications before your next appointment, please call your pharmacy*   Lab Work: Not needed   Testing/Procedures: Will needed to have CT of Aorta April 2023   Follow-Up: At [facility], you and your health needs are our priority.  As part of our continuing mission to provide you with exceptional heart care, we have created designated Provider Care Teams.  These Care Teams include your primary Cardiologist (physician) and Advanced Practice Providers (APPs -  Physician Assistants and Nurse Practitioners) who all work together to provide you with the care you need, when you need it.    Your next appointment:   12 month(s)  The format for your next appointment:   In Person  Provider:   Minus Breeding, MD

## 2020-06-09 ENCOUNTER — Ambulatory Visit (INDEPENDENT_AMBULATORY_CARE_PROVIDER_SITE_OTHER): Payer: PRIVATE HEALTH INSURANCE | Admitting: Physical Therapy

## 2020-06-09 ENCOUNTER — Other Ambulatory Visit: Payer: Self-pay

## 2020-06-09 ENCOUNTER — Encounter: Payer: Self-pay | Admitting: Physical Therapy

## 2020-06-09 DIAGNOSIS — M6281 Muscle weakness (generalized): Secondary | ICD-10-CM

## 2020-06-09 DIAGNOSIS — M25561 Pain in right knee: Secondary | ICD-10-CM

## 2020-06-09 DIAGNOSIS — M25511 Pain in right shoulder: Secondary | ICD-10-CM | POA: Diagnosis not present

## 2020-06-09 DIAGNOSIS — R600 Localized edema: Secondary | ICD-10-CM | POA: Diagnosis not present

## 2020-06-09 DIAGNOSIS — G8929 Other chronic pain: Secondary | ICD-10-CM

## 2020-06-09 NOTE — Therapy (Addendum)
[facility] [address] [place], [place], [address] Phone: [phone]   Fax:  [phone]  Physical Therapy Treatment/Discharge Summary  Patient Details  Name: Patricia Flynn MRN: [id number] Date of Birth: [birth year] Referring Provider (PT): Eunice Blase, MD   Encounter Date: 06/09/2020   PT End of Session - 06/09/20 1140    Visit Number 2    Number of Visits 12    Date for PT Re-Evaluation 07/05/20    PT Start Time 1100    PT Stop Time 1140    PT Time Calculation (min) 40 min    Activity Tolerance Patient tolerated treatment well    Behavior During Therapy [facility] for tasks assessed/performed           Past Medical History:  Diagnosis Date  . cancer    left breast cancer  . Diverticulosis of colon (without mention of hemorrhage)   . Elevated LFTs   . GERD (gastroesophageal reflux disease)   . History of breast cancer 1995  . Hyperlipidemia   . Hypertension 2012    Past Surgical History:  Procedure Laterality Date  . CHOLECYSTECTOMY  09/15/2012   Procedure: LAPAROSCOPIC CHOLECYSTECTOMY WITH INTRAOPERATIVE CHOLANGIOGRAM;  Surgeon: Haywood Lasso, MD;  Location: [place];  Service: General;  Laterality: N/A;  . COLONOSCOPY    . MASTECTOMY  1995   left, followed by reconstruction   . UPPER GASTROINTESTINAL ENDOSCOPY      There were no vitals filed for this visit.   Subjective Assessment - 06/09/20 1104    Subjective Pt reports she can tell a difference, having less swelling in the knee and is able to bend and pickup golf ball easier.    Patient Stated Goals make sure it doesnt get worse.    Currently in Pain? No/denies              [facility] PT Assessment - 06/09/20 0001      Assessment   Medical Diagnosis M25.561 (ICD-10-CM) - Right knee pain, unspecified chronicity, M79.601 (ICD-10-CM) - Right arm pain    Referring Provider (PT) Eunice Blase, MD                         [facility] Adult PT  Treatment/Exercise - 06/09/20 0001      Exercises   Exercises Shoulder;Knee/Hip      Knee/Hip Exercises: Standing   SLS 2x10 eachside, FWD leans, clock work       Shoulder Exercises: Supine   Horizontal ABduction Strengthening;Both;20 reps;Theraband    Theraband Level (Shoulder Horizontal ABduction) Level 4 (Blue)    External Rotation Strengthening;Both;Theraband;20 reps    Theraband Level (Shoulder External Rotation) Level 4 (Blue)    External Rotation Limitations VC for form  - keep elbows flexed     Flexion Strengthening;Both;20 reps;Theraband    Theraband Level (Shoulder Flexion) Level 4 (Blue)    Flexion Limitations overhead pull     Diagonals AAROM;Both;20 reps;Theraband   PNF D2 flexion   Theraband Level (Shoulder Diagonals) Level 4 (Blue)                       PT Long Term Goals - 05/24/20 1624      PT LONG TERM GOAL #1   Title Pt will be I and compliant with HEP    Time 6    Period Weeks    Status New    Target Date 07/05/20      PT  LONG TERM GOAL #2   Title Pt will improve Rt hip strength to 4+ and Rt quad strength 5 MMT to improve function.    Time 6    Period Weeks    Status New      PT LONG TERM GOAL #3   Title Pt will improve Rt knee/hip ROM to WNL    Time 6    Period Weeks    Status New      PT LONG TERM GOAL #4   Title Pt will report less overall pain with squatting and stairs.    Baseline 9 when she squats    Time 6    Period Weeks    Status New                 Plan - 06/09/20 1127    Clinical Impression Statement Patricia Flynn is very pleased with her progress.  She reports decreased pain with her activities.  Pt requested to have instruction in more exercise and then perform those for the next 3 wks to see how she does.  She will return the end of the month for a reassessment if she is still doing well.    Rehab Potential Good    PT Frequency 2x / week    PT Duration 6 weeks    PT Treatment/Interventions ADLs/Self Care Home  Management;Cryotherapy;Electrical Stimulation;Iontophoresis 43m/ml Dexamethasone;Moist Heat;Ultrasound;Therapeutic activities;Therapeutic exercise;Neuromuscular re-education;Manual techniques;Balance training;Passive range of motion;Dry needling;Joint Manipulations;Vasopneumatic Device;Taping    PT Next Visit Plan pt request to be on hold for 3 wks -  perform her HEP    PT Home Exercise Plan Access Code: [email or web address] Code: [email or web address]    Consulted and Agree with Plan of Care Patient           Patient will benefit from skilled therapeutic intervention in order to improve the following deficits and impairments:  Decreased activity tolerance, Decreased mobility, Decreased range of motion, Decreased strength, Increased edema, Increased muscle spasms, Impaired flexibility, Pain  Visit Diagnosis: Chronic pain of right knee  Chronic right shoulder pain  Muscle weakness (generalized)  Localized edema     Problem List Patient Active Problem List   Diagnosis Date Noted  . Educated about COVID-19 virus infection 06/04/2020  . [place] 06/[place]  . Palpitations 04/12/2019  . S/P left mastectomy 02/22/2019  . Dryness of vagina 01/04/2014  . Essential hypertension 12/13/2010  . Chest pain 11/30/2010  . HYPERLIPIDEMIA 06/23/2006  . BREAST CANCER, HX OF 06/23/2006    SJeral PinchPT 06/09/2020, 11:59 AM  [facility] [address] [address]Phone: [phone]  Fax:  [phone] Name: Patricia BRADWAYMRN: [id number] of Birth: 124-Mar-1947    PHYSICAL THERAPY DISCHARGE SUMMARY  Visits from Start of Care: 2  Current functional level related to goals / functional outcomes: See above   Remaining deficits: unknown   Education / Equipment: HEP  Plan: Patient agrees to discharge.  Patient goals were not met. Patient is being discharged due to not returning since the last visit.  ?????      SLaureen Abrahams PT, DPT 09/04/20 12:02 PM  [facility] [address] [place] [address]Phone: [phone]  Fax:  [phone]

## 2020-06-09 NOTE — Patient Instructions (Signed)
Access Code: [email or web address] URL: [email or web address] Date: 06/09/2020 Prepared by: Jeral Pinch  Exercises Supine Shoulder Horizontal Abduction with Resistance - 2 x daily - 4-5 x weekly - 1-2 sets - 10 reps - 3 sec hold Supine Shoulder External Rotation with Resistance - 2 x daily - 4-5 x weekly - 1-2 sets - 10 reps - 3 sec hold Supine narrow and wide grip flexion - 4 x daily - 4-5 x weekly - 1-2 sets - 10 reps - 3 sec hold Supine PNF D2 - 1 x daily - 4-5 x weekly - 1-2 sets - 10 reps - 3 sec hold Forward T - 4-5 x weekly - 1-2 sets - 10 reps Single Leg Balance with Clock Reach - 4-5 x weekly - 1-2 sets - 10 reps

## 2020-06-12 ENCOUNTER — Encounter: Payer: PRIVATE HEALTH INSURANCE | Admitting: Physical Therapy

## 2020-06-19 ENCOUNTER — Encounter: Payer: PRIVATE HEALTH INSURANCE | Admitting: Physical Therapy

## 2020-06-26 ENCOUNTER — Encounter: Payer: PRIVATE HEALTH INSURANCE | Admitting: Physical Therapy

## 2020-06-29 ENCOUNTER — Encounter: Payer: PRIVATE HEALTH INSURANCE | Admitting: Rehabilitative and Restorative Service Providers"

## 2020-07-17 ENCOUNTER — Encounter: Payer: PRIVATE HEALTH INSURANCE | Admitting: Physical Therapy

## 2020-08-14 ENCOUNTER — Ambulatory Visit: Payer: Self-pay | Admitting: Obstetrics & Gynecology

## 2020-09-19 ENCOUNTER — Ambulatory Visit: Payer: PRIVATE HEALTH INSURANCE | Admitting: Obstetrics & Gynecology

## 2020-10-22 IMAGING — CR CHEST - 2 VIEW
2 series · 2 of 2 positions shown · non-contrast
Comparison: 09/08/2012

CLINICAL DATA: Chest pain

EXAM:
CHEST - 2 VIEW

[w chest pa]
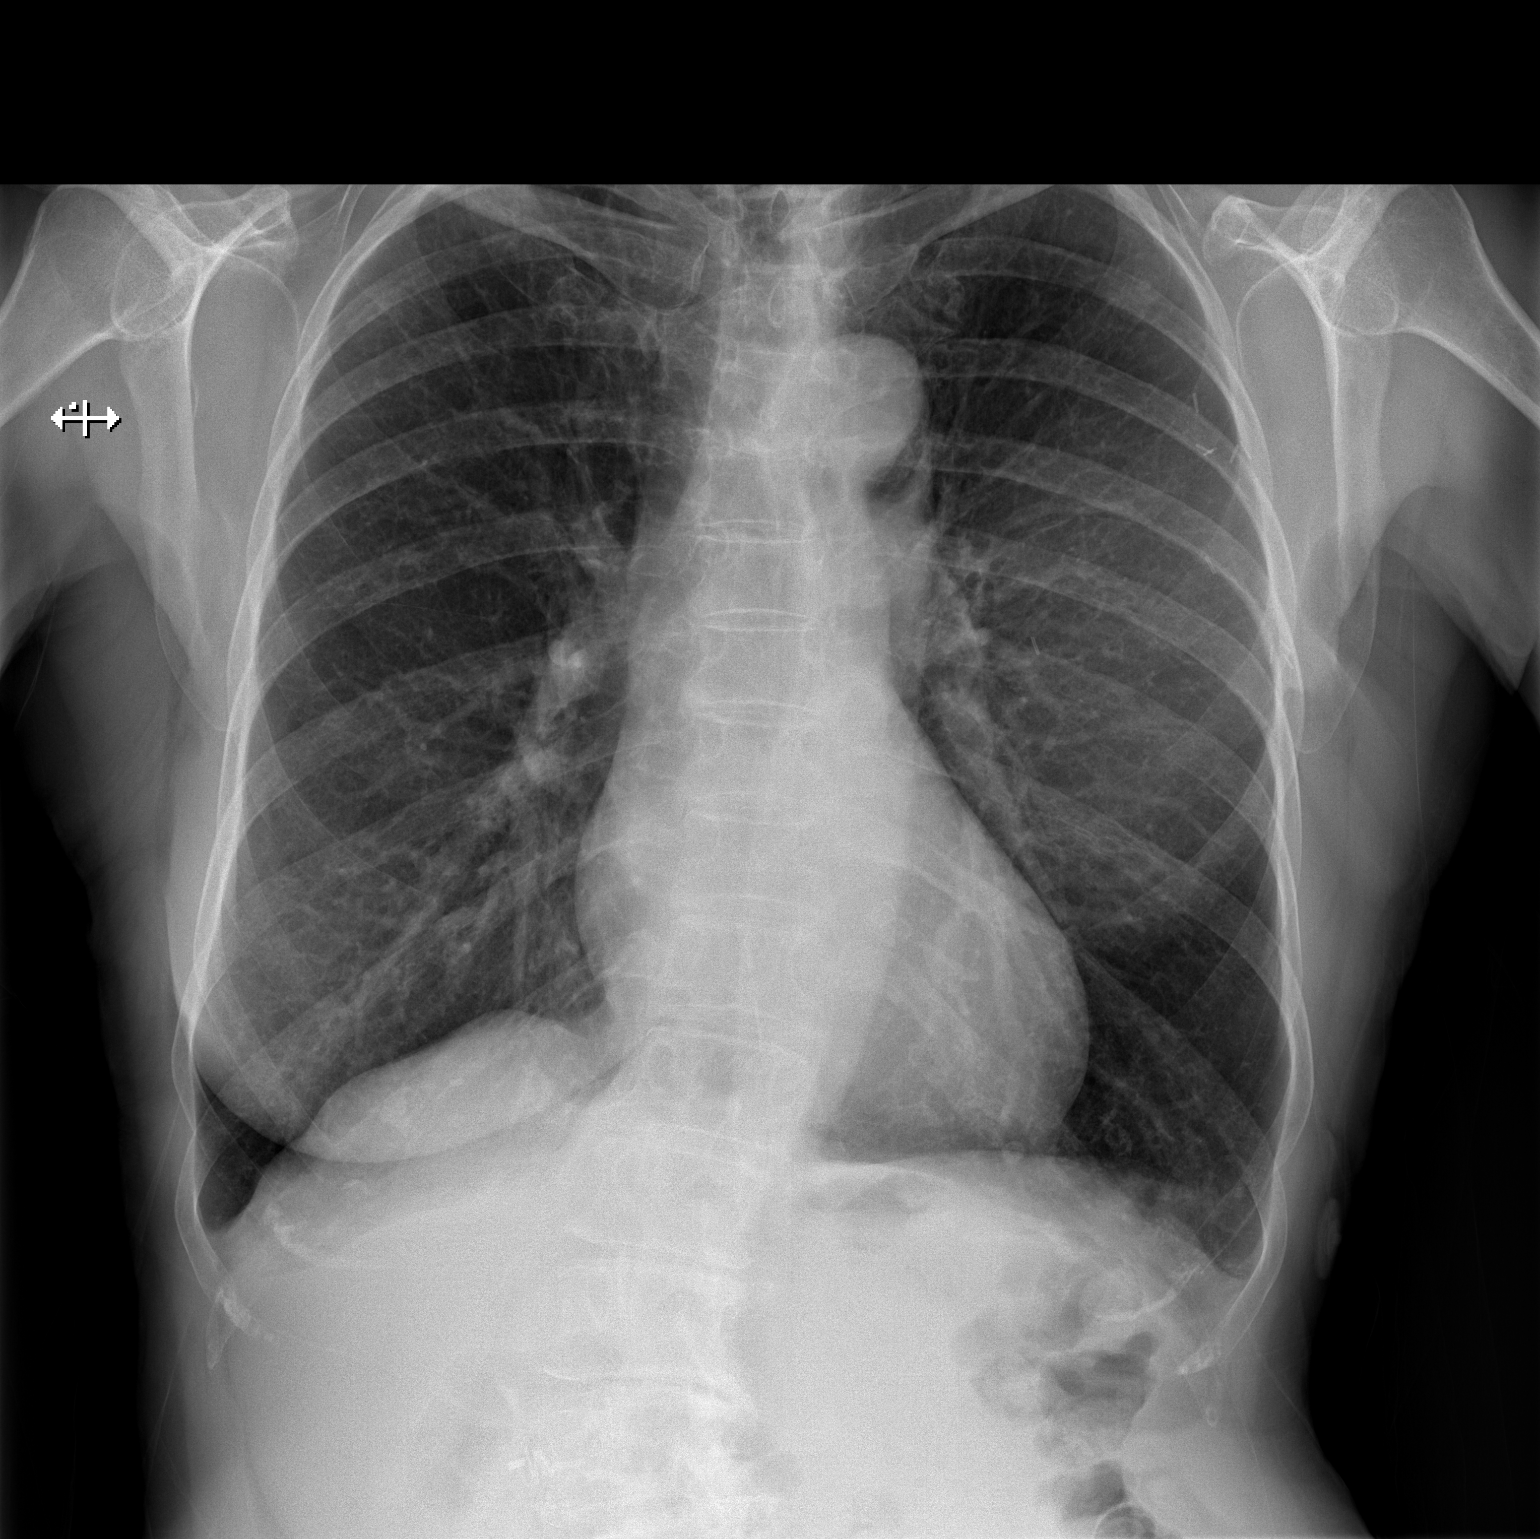

[w chest lat]
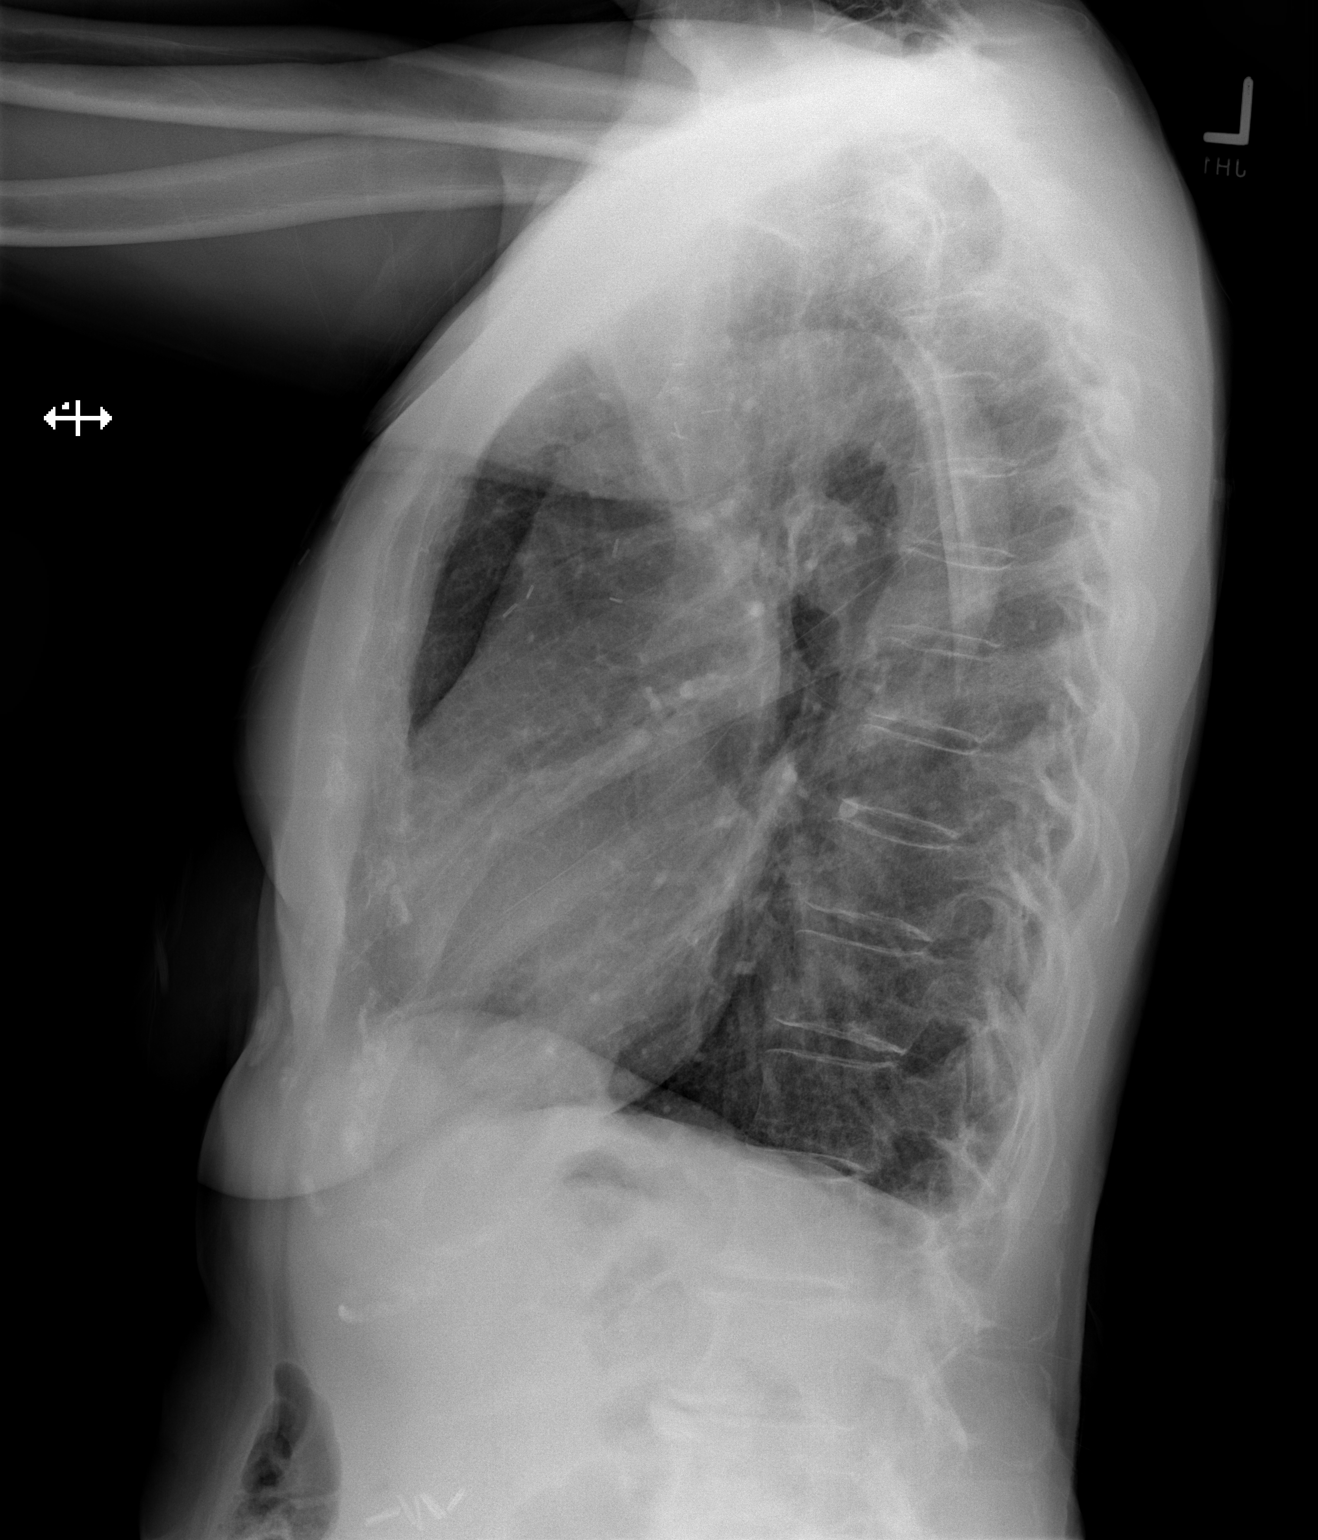

[2 of 2 positions shown; findings below may reference images not displayed]

FINDINGS: There is hyperinflation of the lungs compatible with COPD. Heart and
mediastinal contours are within normal limits. No focal opacities or
effusions. No acute bony abnormality.
IMPRESSION: COPD.  No active disease.

## 2020-12-06 ENCOUNTER — Telehealth: Payer: Self-pay | Admitting: Cardiology

## 2020-12-06 ENCOUNTER — Other Ambulatory Visit: Payer: Self-pay | Admitting: *Deleted

## 2020-12-06 DIAGNOSIS — I712 Thoracic aortic aneurysm, without rupture, unspecified: Secondary | ICD-10-CM

## 2020-12-06 NOTE — Telephone Encounter (Signed)
Spoke with patient regarding the Tuesday 12/26/20 9:00 am CTA chest/aorta appointment at [facility] CT---arrival time is 8:45 am for check in---Liquids only 4 hours prior to study---patient to com in the morning of 4/.25/22 (early am) for labs.  Patient voiced her understanding.

## 2020-12-25 ENCOUNTER — Other Ambulatory Visit: Payer: Self-pay | Admitting: *Deleted

## 2020-12-25 DIAGNOSIS — I712 Thoracic aortic aneurysm, without rupture, unspecified: Secondary | ICD-10-CM

## 2020-12-25 LAB — BASIC METABOLIC PANEL
BUN/Creatinine Ratio: 15 (ref 12–28)
BUN: 13 mg/dL (ref 8–27)
CO2: 24 mmol/L (ref 20–29)
Calcium: 9.2 mg/dL (ref 8.7–10.3)
Chloride: 103 mmol/L (ref 96–106)
Creatinine, Ser: 0.87 mg/dL (ref 0.57–1.00)
Glucose: 79 mg/dL (ref 65–99)
Potassium: 3.9 mmol/L (ref 3.5–5.2)
Sodium: 140 mmol/L (ref 134–144)
eGFR: 69 mL/min/{1.73_m2} (ref >59)

## 2020-12-26 ENCOUNTER — Ambulatory Visit (HOSPITAL_COMMUNITY): Payer: No Typology Code available for payment source

## 2020-12-26 ENCOUNTER — Other Ambulatory Visit (HOSPITAL_COMMUNITY): Payer: No Typology Code available for payment source

## 2020-12-26 ENCOUNTER — Ambulatory Visit (INDEPENDENT_AMBULATORY_CARE_PROVIDER_SITE_OTHER)
Admission: RE | Admit: 2020-12-26 | Discharge: 2020-12-26 | Disposition: A | Discharge status: Home / Self Care | Payer: No Typology Code available for payment source | Source: Ambulatory Visit | Attending: Cardiology | Admitting: Cardiology

## 2020-12-26 ENCOUNTER — Ambulatory Visit (INDEPENDENT_AMBULATORY_CARE_PROVIDER_SITE_OTHER): Payer: No Typology Code available for payment source | Admitting: Obstetrics & Gynecology

## 2020-12-26 ENCOUNTER — Encounter (HOSPITAL_BASED_OUTPATIENT_CLINIC_OR_DEPARTMENT_OTHER): Payer: Self-pay | Admitting: Obstetrics & Gynecology

## 2020-12-26 ENCOUNTER — Other Ambulatory Visit: Payer: Self-pay

## 2020-12-26 VITALS — BP 131/92 | HR 61 | Resp 18 | Ht 64.5 in | Wt 142.6 lb

## 2020-12-26 DIAGNOSIS — I712 Thoracic aortic aneurysm, without rupture, unspecified: Secondary | ICD-10-CM

## 2020-12-26 DIAGNOSIS — N859 Noninflammatory disorder of uterus, unspecified: Secondary | ICD-10-CM

## 2020-12-26 DIAGNOSIS — Z9012 Acquired absence of left breast and nipple: Secondary | ICD-10-CM

## 2020-12-26 DIAGNOSIS — Z01419 Encounter for gynecological examination (general) (routine) without abnormal findings: Secondary | ICD-10-CM | POA: Diagnosis not present

## 2020-12-26 DIAGNOSIS — Z853 Personal history of malignant neoplasm of breast: Secondary | ICD-10-CM

## 2020-12-26 DIAGNOSIS — N952 Postmenopausal atrophic vaginitis: Secondary | ICD-10-CM | POA: Diagnosis not present

## 2020-12-26 MED ORDER — IOHEXOL 350 MG/ML SOLN
100.0000 mL | Freq: Once | INTRAVENOUS | Status: AC | PRN
  Administered 2020-12-26: 100 mL via INTRAVENOUS

## 2020-12-26 MED ORDER — ESTRADIOL 10 MCG VA TABS: ORAL_TABLET | VAGINAL | 4 refills | Status: DC

## 2020-12-26 NOTE — Progress Notes (Signed)
75 y.o. G3P3 Married White or Caucasian female here for breast and pelvic exam.  Had IBS issues.  On Zoloft 25mg , 1/2 tab 4 times weekly.  This is working very well for her.  She does have some occasional episodes of looser stools or soft stools, three or four times in a days, but then it will normalize.  She thinks this may be related to what she eats.    Has hx of endometrial fluid within endometrial cavity.  Ultrasound 01/2020 showed this to be stable.  I recommended not repeating ultrasound unless there were new changes.  Pt, last year, wanted repeat but as her abdominal issues really seem to be related to GI, will not repeat thi syear.  Denies vaginal bleeding.    Is being follow for thoracis aortic aneurysm.  Had follow up imaging today that was stable.  Continues to have MMGs at [facility].    No LMP recorded. Patient is postmenopausal.          Sexually active: Yes.    H/O STD:  no  Health Maintenance: PCP:  Dr. Joylene Draft.  Is seen yearly.  Did blood work at that appt:  yes Vaccines are up to date:  No, pt has not done covid booster--waiting Colonoscopy:  2016 Dr. Henrene Pastor MMG:  02/2020, at [facility] and in Care Everywhere BMD:  Followed by Dr Joylene Draft Last pap smear:  2017, normal.   H/o abnormal pap smear:  no   reports that she quit smoking about 42 years ago. Her smoking use included cigarettes. She has never used smokeless tobacco. She reports current alcohol use of about 14.0 standard drinks of alcohol per week. She reports that she does not use drugs.  Past Medical History:  Diagnosis Date  . cancer    left breast cancer  . Diverticulosis of colon (without mention of hemorrhage)   . Elevated LFTs   . GERD (gastroesophageal reflux disease)   . History of breast cancer 1995  . Hyperlipidemia   . Hypertension 2012    Past Surgical History:  Procedure Laterality Date  . CHOLECYSTECTOMY  09/15/2012   Procedure: LAPAROSCOPIC CHOLECYSTECTOMY WITH INTRAOPERATIVE CHOLANGIOGRAM;  Surgeon:  Haywood Lasso, MD;  Location: [place];  Service: General;  Laterality: N/A;  . COLONOSCOPY    . MASTECTOMY  1995   left, followed by reconstruction   . UPPER GASTROINTESTINAL ENDOSCOPY      Current Outpatient Medications  Medication Sig Dispense Refill  . ascorbic acid (VITAMIN C) 1000 MG tablet Take 1,000 mg by mouth daily.     Marland Kitchen BYSTOLIC 5 MG tablet Take 2.5 mg by mouth at bedtime.     . Estradiol 10 MCG TABS vaginal tablet 1 pv twice weekly 24 tablet 4  . NASCOBAL 500 MCG/0.1ML SOLN Place into both nostrils once a week.    Marland Kitchen omeprazole (PRILOSEC OTC) 20 MG tablet Take 1 tablet (20 mg total) by mouth daily. 90 tablet 3  . rosuvastatin (CRESTOR) 5 MG tablet Take 1 tablet by mouth daily.    . sertraline (ZOLOFT) 50 MG tablet Take 0.5 tablets (25 mg total) by mouth at bedtime. 90 tablet 3   Current Facility-Administered Medications  Medication Dose Route Frequency Provider Last Rate Last Admin  . methylPREDNISolone acetate (DEPO-MEDROL) injection 40 mg  40 mg Intra-articular Once Hilts, Michael, MD        Family History  Problem Relation Age of Onset  . Breast cancer Mother 46  . Heart disease Father 36  2 MIs   . Colon cancer Neg Hx   . Stomach cancer Neg Hx   . Esophageal cancer Neg Hx   . Rectal cancer Neg Hx     Review of Systems  Exam:   BP (!) 131/92   Pulse 61   Resp 18   Ht 5' 4.5" (1.638 m)   Wt 142 lb 9.6 oz (64.7 kg)   BMI 24.10 kg/m   Height: 5' 4.5" (163.8 cm)  General appearance: alert, cooperative and appears stated age Breasts: left implant present, no masses, skin change, nipple discharge, LAD with either breast Abdomen: soft, non-tender; bowel sounds normal; no masses,  no organomegaly Lymph nodes: Cervical, supraclavicular, and axillary nodes normal.  No abnormal inguinal nodes palpated Neurologic: Grossly normal  Pelvic: External genitalia:  no lesions              Urethra:  normal appearing urethra with no masses, tenderness or lesions               Bartholins and Skenes: normal                 Vagina: normal appearing vagina with atrophic changes and no discharge, no lesions              Cervix: no lesions              Pap taken: No. Bimanual Exam:  Uterus:  normal size, contour, position, consistency, mobility, non-tender              Adnexa: normal adnexa and no mass, fullness, tenderness               Rectovaginal: Confirms               Anus:  normal sphincter tone, no lesions  Chaperone, Acquanetta Chain, RN, was present for exam.  Assessment/Plan: 1. Encntr for gyn exam (general) (routine) w/o abn findings - pap smear not indicated - MMG at [facility] 2021 - Colonoscopy 2016 - Dr. Joylene Draft follows BMD and lab work - vaccines updated  2. Vaginal atrophy - Estradiol 10 MCG TABS vaginal tablet; 1 pv twice weekly  Dispense: 24 tablet; Refill: 4  3. S/P left mastectomy  4. History of breast cancer  5. Fluid in endometrial cavity - ultrasound 5/21 stable.  Do not plan to repeat unless has new symptoms.

## 2020-12-27 DIAGNOSIS — Z853 Personal history of malignant neoplasm of breast: Secondary | ICD-10-CM | POA: Insufficient documentation

## 2021-01-10 ENCOUNTER — Telehealth: Payer: Self-pay

## 2021-01-10 ENCOUNTER — Telehealth: Payer: Self-pay | Admitting: Family Medicine

## 2021-01-10 NOTE — Telephone Encounter (Signed)
Patient called. She can not come today. Her flight will get in tomorrow. She can come after 11am. Would like to be worked in. She twisted her ankle. Her call back number is [phone]

## 2021-01-10 NOTE — Telephone Encounter (Signed)
Patient called she is requesting to e worked into Dr.Hilts schedule tomorrow after 11:30 , patient stated she fell and twisted her ankle she

## 2021-01-10 NOTE — Telephone Encounter (Signed)
I called and left a vm: Dr. Junius Roads is not in clinic on Thursday afternoons. Another provider may be able to see her instead. We did have a cancellation at 4 today --- she can come to that, if she is able. Asked her to call back to schedule.

## 2021-01-11 NOTE — Telephone Encounter (Signed)
The patient called in again yesterday afternoon and was instructed to give [place] a call in the morning (today) to see if there have been any cancellations with any provider this afternoon.

## 2021-01-12 ENCOUNTER — Encounter: Payer: Self-pay | Admitting: Family Medicine

## 2021-01-12 ENCOUNTER — Other Ambulatory Visit: Payer: Self-pay

## 2021-01-12 ENCOUNTER — Ambulatory Visit (INDEPENDENT_AMBULATORY_CARE_PROVIDER_SITE_OTHER): Payer: No Typology Code available for payment source | Admitting: Family Medicine

## 2021-01-12 DIAGNOSIS — M25572 Pain in left ankle and joints of left foot: Secondary | ICD-10-CM | POA: Diagnosis not present

## 2021-01-12 NOTE — Progress Notes (Signed)
Office Visit Note   Patient: Patricia Flynn           Date of Birth: 28-Jan-1946           MRN: [id number] Visit Date: 01/12/2021 Requested by: Crist Infante, MD [address] [place],  [place] [address] PCP: Crist Infante, MD  Subjective: Chief Complaint  Patient presents with  . Left Ankle - Pain    Turned ankle over, walking down steps while in [place] one week ago. Iced it the first day and ibuprofen 400 mg bid x 2 days. Has been walking on it for miles each day (got back in town yesterday). Ankle is swollen, but she is not having much pain. Wearing an ankle sleeve.    HPI: She is here with left ankle pain.  Last week while walking down some steps in [place], she inverted her ankle and fell.  It hurt but she was able to get back up and bear weight on it right away, and walked another mile.  She continued to be active throughout her trip, sometimes walking 5 miles per day.  The ankle really does not hurt much, but it is still swollen.  She has been using an ankle sleeve for comfort.  No previous problems with the ankle.               ROS:   All other systems were reviewed and are negative.  Objective: Vital Signs: There were no vitals taken for this visit.  Physical Exam:  General:  Alert and oriented, in no acute distress. Pulm:  Breathing unlabored. Psy:  Normal mood, congruent affect. Skin: There is some residual bruising on the anterolateral aspect of the ankle and foot. Left ankle: No tenderness at the proximal fibula, negative syndesmosis squeeze.  No significant bony tenderness over the malleoli, talus, calcaneus, proximal fifth metatarsal.  Very minimal tenderness to palpation over the ATFL.  Ankle feels stable with anterior drawer and talar tilt testing.  Ankle strength is 5/5 in all directions.  She has poor stability bilaterally with 1 foot balance with eyes closed.   Imaging: No results found.  Assessment & Plan: 1.  Left ankle lateral sprain -We will try ASO brace,  home exercises.  X-ray if symptoms do not improve.     Procedures: No procedures performed        PMFS History: Patient Active Problem List   Diagnosis Date Noted  . History of breast cancer 12/27/2020  . 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 Palpitations 04/12/2019  . S/P left mastectomy 02/22/2019  . Vaginal atrophy 06/26/2014  . Dryness of vagina 01/04/2014  . Essential hypertension 12/13/2010  . Chest pain 11/30/2010  . HYPERLIPIDEMIA 06/23/2006   Past Medical History:  Diagnosis Date  . Aortic aneurysm ([place])   . cancer    left breast cancer  . Diverticulosis of colon (without mention of hemorrhage)   . Elevated LFTs   . GERD (gastroesophageal reflux disease)   . History of breast cancer 1995  . Hyperlipidemia   . Hypertension 2012    Family History  Problem Relation Age of Onset  . Breast cancer Mother 40  . Heart disease Father 32       2 MIs   . Colon cancer Neg Hx   . Stomach cancer Neg Hx   . Esophageal cancer Neg Hx   . Rectal cancer Neg Hx     Past Surgical History:  Procedure Laterality Date  . CHOLECYSTECTOMY  09/15/2012   Procedure: LAPAROSCOPIC CHOLECYSTECTOMY WITH INTRAOPERATIVE CHOLANGIOGRAM;  Surgeon: Haywood Lasso, MD;  Location: [place];  Service: General;  Laterality: N/A;  . COLONOSCOPY    . MASTECTOMY  1995   left, followed by reconstruction   . UPPER GASTROINTESTINAL ENDOSCOPY     Social History   Occupational History  . Not on file  Tobacco Use  . Smoking status: Former Smoker    Types: Cigarettes    Quit date: 09/02/1978    Years since quitting: 42.3  . Smokeless tobacco: Never Used  Vaping Use  . Vaping Use: Never used  Substance and Sexual Activity  . Alcohol use: Yes    Alcohol/week: 14.0 standard drinks    Types: 14 Glasses of wine per week    Comment: 2  0.4 ounces glass red OR WHITE wine per day  . Drug use: No  . Sexual activity: Yes    Partners: Male    Birth control/protection: Post-menopausal

## 2021-01-17 ENCOUNTER — Other Ambulatory Visit: Payer: Self-pay | Admitting: Internal Medicine

## 2021-05-29 ENCOUNTER — Other Ambulatory Visit: Payer: Self-pay | Admitting: Internal Medicine

## 2021-08-03 ENCOUNTER — Ambulatory Visit: Payer: No Typology Code available for payment source | Admitting: Cardiology

## 2021-08-20 ENCOUNTER — Other Ambulatory Visit: Payer: Self-pay | Admitting: Internal Medicine

## 2021-08-21 ENCOUNTER — Other Ambulatory Visit: Payer: Self-pay | Admitting: Internal Medicine

## 2021-08-21 ENCOUNTER — Ambulatory Visit: Payer: No Typology Code available for payment source | Admitting: Orthopaedic Surgery

## 2021-08-21 NOTE — Progress Notes (Signed)
Cardiology Office Note   Date:  08/22/2021   ID:  Kei, Langhorst 12/08/1945, MRN [id number]  PCP:  Crist Infante, MD  Cardiologist:   Minus Breeding, MD Referring:  Crist Infante, MD   Chief Complaint  Patient presents with   Palpitations      History of Present Illness: Patricia Flynn is a 75 y.o. female who presents was referred by Crist Infante, MD for evaluation of palpitations.   She has no past history other than HTN and a negative perfusion study 2012.      I sent her for a CT which demonstrated no coronary calcium although there was mild aortic dilatation.  An echo was done and there were no significant abnormalities.  EF was low normal.  She had a monitor which demonstrated some paroxysmal atrial tachycardia but no atrial fibrillation.  SVT was nonsustained.   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    Since I last saw her she has done very well.  She walks 2 to 3 miles a day.  She does weights and works out with a Clinical research associate. The patient denies any new symptoms such as chest discomfort, neck or arm discomfort. There has been no new shortness of breath, PND or orthopnea. There have been no reported palpitations, presyncope or syncope.   Past Medical History:  Diagnosis Date   Aortic aneurysm ([place])    cancer    left breast cancer   Diverticulosis of colon (without mention of hemorrhage)    Elevated LFTs    GERD (gastroesophageal reflux disease)    History of breast cancer 1995   Hyperlipidemia    Hypertension 2012    Past Surgical History:  Procedure Laterality Date   CHOLECYSTECTOMY  09/15/2012   Procedure: LAPAROSCOPIC CHOLECYSTECTOMY WITH INTRAOPERATIVE CHOLANGIOGRAM;  Surgeon: Haywood Lasso, MD;  Location: [facility];  Service: General;  Laterality: N/A;   [place]   left, followed by reconstruction    UPPER GASTROINTESTINAL ENDOSCOPY       Current Outpatient Medications  Medication  Sig Dispense Refill   ascorbic acid (VITAMIN C) 1000 MG tablet Take 1,000 mg by mouth daily.      BYSTOLIC 5 MG tablet Take 2.5 mg by mouth at bedtime.      Estradiol 10 MCG TABS vaginal tablet 1 pv twice weekly 24 tablet 4   NASCOBAL 500 MCG/0.1ML SOLN Place into both nostrils once a week.     omeprazole (PRILOSEC) 20 MG capsule TAKE ONE CAPSULE EACH DAY 90 capsule 1   rosuvastatin (CRESTOR) 5 MG tablet Take 1 tablet by mouth daily.     sertraline (ZOLOFT) 50 MG tablet Take 0.5 tablets (25 mg total) by mouth at bedtime. 90 tablet 3   Current Facility-Administered Medications  Medication Dose Route Frequency Provider Last Rate Last Admin   methylPREDNISolone acetate (DEPO-MEDROL) injection 40 mg  40 mg Intra-articular Once Hilts, Michael, MD        Allergies:   Patient has no known allergies.     ROS:  Please see the history of present illness.   Otherwise, review of systems are positive for none.   All other systems are reviewed and negative.    PHYSICAL EXAM: VS:  BP 122/80 (BP Location: Left Arm, Patient Position: Sitting, Cuff Size: Normal)    Pulse 65    Resp 20    Ht 5\' 6"  (  1.676 m)    Wt 145 lb (65.8 kg)    SpO2 98%    BMI 23.40 kg/m  , BMI Body mass index is 23.4 kg/m. GENERAL:  Well appearing NECK:  No jugular venous distention, waveform within normal limits, carotid upstroke brisk and symmetric, no bruits, no thyromegaly LUNGS:  Clear to auscultation bilaterally CHEST:  Unremarkable HEART:  PMI not displaced or sustained,S1 and S2 within normal limits, no S3, no S4, no clicks, no rubs, no murmurs ABD:  Flat, positive bowel sounds normal in frequency in pitch, no bruits, no rebound, no guarding, no midline pulsatile mass, no hepatomegaly, no splenomegaly EXT:  2 plus pulses throughout, no edema, no cyanosis no clubbing   EKG:  EKG  ordered today. The ekg ordered  demonstrated  Sinus rhythm, rate 65, leftward axis, intervals within normal limits, no acute ST-T wave  changes.   Recent Labs: 12/25/2020: BUN 13; Creatinine, Ser 0.87; Potassium 3.9; Sodium 140    Lipid Panel    Component Value Date/Time   CHOL 220 (H) 06/13/2009 0800   TRIG 45.0 06/13/2009 0800   TRIG 48 06/17/2006 1039   HDL 73.60 06/13/2009 0800   CHOLHDL 3 06/13/2009 0800   VLDL 9.0 06/13/2009 0800   LDLDIRECT 134.5 06/13/2009 0800      Wt Readings from Last 3 Encounters:  08/22/21 145 lb (65.8 kg)  12/26/20 142 lb 9.6 oz (64.7 kg)  06/05/20 141 lb (64 kg)      Other studies Reviewed: Additional studies/ records that were reviewed today include:  None Review of the above records demonstrates:   Please see elsewhere in the note.     ASSESSMENT AND PLAN:  PALPITATIONS:   She does not have any of these.  In retrospect we think symptoms she might of had were related to reflux.  No further evaluation is necessary.   HTN:  The blood pressure is controlled.  No change in therapy.   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  I will follow this up with a follow-up aorta in April 2024 and see her after that.     Current medicines are reviewed at length with the patient today.  The patient does not have concerns regarding medicines.  The following changes have been made:  None  Labs/ tests ordered today include:   None  Orders Placed This Encounter  Procedures   CT ANGIO CHEST AORTA W &/OR WO CONTRAST   EKG 12-Lead     Disposition:   FU with me in 12 months.      Signed, Minus Breeding, MD  08/22/2021 11:17 AM    [place]

## 2021-08-22 ENCOUNTER — Ambulatory Visit (INDEPENDENT_AMBULATORY_CARE_PROVIDER_SITE_OTHER): Payer: No Typology Code available for payment source | Admitting: Cardiology

## 2021-08-22 ENCOUNTER — Telehealth: Payer: Self-pay | Admitting: Internal Medicine

## 2021-08-22 ENCOUNTER — Other Ambulatory Visit: Payer: Self-pay

## 2021-08-22 ENCOUNTER — Encounter: Payer: Self-pay | Admitting: Cardiology

## 2021-08-22 VITALS — BP 122/80 | HR 65 | Resp 20 | Ht 66.0 in | Wt 145.0 lb

## 2021-08-22 DIAGNOSIS — I10 Essential (primary) hypertension: Secondary | ICD-10-CM

## 2021-08-22 DIAGNOSIS — I77819 Aortic ectasia, unspecified site: Secondary | ICD-10-CM | POA: Diagnosis not present

## 2021-08-22 DIAGNOSIS — R002 Palpitations: Secondary | ICD-10-CM | POA: Diagnosis not present

## 2021-08-22 MED ORDER — SERTRALINE HCL 50 MG PO TABS: 25.0000 mg | ORAL_TABLET | Freq: Every day | ORAL | 3 refills | Status: DC

## 2021-08-22 NOTE — Telephone Encounter (Signed)
Inbound call from patient requesting additional refills for Zoloft to be sent to pharmacy in chart please.

## 2021-08-22 NOTE — Telephone Encounter (Signed)
Follow up schedule scheduled for 10-02-2021 @ 10:40 am, refills given for a month.

## 2021-08-22 NOTE — Patient Instructions (Signed)
°  Follow-Up: At [facility], you and your health needs are our priority.  As part of our continuing mission to provide you with exceptional heart care, we have created designated Provider Care Teams.  These Care Teams include your primary Cardiologist (physician) and Advanced Practice Providers (APPs -  Physician Assistants and Nurse Practitioners) who all work together to provide you with the care you need, when you need it.  We recommend signing up for the patient portal called "MyChart".  Sign up information is provided on this After Visit Summary.  MyChart is used to connect with patients for Virtual Visits (Telemedicine).  Patients are able to view lab/test results, encounter notes, upcoming appointments, etc.  Non-urgent messages can be sent to your provider as well.   To learn more about what you can do with MyChart, go to [email or web address].    Your next appointment:  April 2024

## 2021-08-22 NOTE — Addendum Note (Signed)
Addended by: Elias Else on: 08/22/2021 10:14 AM   Modules accepted: Orders

## 2021-08-23 ENCOUNTER — Other Ambulatory Visit: Payer: Self-pay

## 2021-10-02 ENCOUNTER — Ambulatory Visit: Payer: No Typology Code available for payment source | Admitting: Internal Medicine

## 2021-11-23 ENCOUNTER — Telehealth: Payer: Self-pay | Admitting: Internal Medicine

## 2021-11-23 MED ORDER — SERTRALINE HCL 50 MG PO TABS: 25.0000 mg | ORAL_TABLET | Freq: Every day | ORAL | 2 refills | Status: DC

## 2021-11-23 NOTE — Telephone Encounter (Signed)
Inbound call from patient requesting a medication refill for Zoloft sent to [facility]. Patient have appt scheduled 5/2. ?

## 2021-11-23 NOTE — Telephone Encounter (Signed)
Zoloft refilled

## 2022-01-01 ENCOUNTER — Encounter: Payer: Self-pay | Admitting: Internal Medicine

## 2022-01-01 ENCOUNTER — Ambulatory Visit (INDEPENDENT_AMBULATORY_CARE_PROVIDER_SITE_OTHER): Payer: No Typology Code available for payment source | Admitting: Internal Medicine

## 2022-01-01 VITALS — BP 98/72 | HR 62 | Ht 66.0 in | Wt 141.0 lb

## 2022-01-01 DIAGNOSIS — K219 Gastro-esophageal reflux disease without esophagitis: Secondary | ICD-10-CM | POA: Diagnosis not present

## 2022-01-01 DIAGNOSIS — K589 Irritable bowel syndrome without diarrhea: Secondary | ICD-10-CM | POA: Diagnosis not present

## 2022-01-01 MED ORDER — OMEPRAZOLE 20 MG PO CPDR: DELAYED_RELEASE_CAPSULE | ORAL | 3 refills | Status: DC

## 2022-01-01 MED ORDER — SERTRALINE HCL 50 MG PO TABS: 25.0000 mg | ORAL_TABLET | Freq: Every day | ORAL | 6 refills | Status: DC

## 2022-01-01 NOTE — Progress Notes (Signed)
HISTORY OF PRESENT ILLNESS: ? ?Patricia Flynn is a 76 y.o. female with a history of chronic functional abdominal complaints, health related anxiety, GERD, and prior colonoscopy.  She was last seen in this office December 30, 2019.  See that dictation for details.  She was doing well on a combination of low-dose Zoloft and omeprazole.  Follow-up in 1 year recommended.  She presents at this time after requesting medication refill and instructed by the office to schedule a clinical visit. ? ?Patricia Flynn tells me that she is doing well.  She continues on omeprazole 20 mg daily.  She takes one half dose of Zoloft at night.  She occasionally has flareups of her IBS type symptoms but this has been manageable.  No new problems.  Her overall sense of wellbeing is good.  Last upper endoscopy August 2020.  Last colonoscopy March 2016.  This was normal. ? ?REVIEW OF SYSTEMS: ? ?All non-GI ROS negative unless otherwise stated in the HPI. ?Past Medical History:  ?Diagnosis Date  ? Aortic aneurysm ([place])   ? cancer   ? left breast cancer  ? Diverticulosis of colon (without mention of hemorrhage)   ? Elevated LFTs   ? GERD (gastroesophageal reflux disease)   ? History of breast cancer 1995  ? Hyperlipidemia   ? Hypertension 2012  ? ? ?Past Surgical History:  ?Procedure Laterality Date  ? CHOLECYSTECTOMY  09/15/2012  ? Procedure: LAPAROSCOPIC CHOLECYSTECTOMY WITH INTRAOPERATIVE CHOLANGIOGRAM;  Surgeon: Haywood Lasso, MD;  Location: [facility];  Service: General;  Laterality: N/A;  ? COLONOSCOPY    ? MASTECTOMY  1995  ? left, followed by reconstruction   ? UPPER GASTROINTESTINAL ENDOSCOPY    ? ? ?Social History ?Patricia Flynn  reports that she quit smoking about 43 years ago. Her smoking use included cigarettes. She has never used smokeless tobacco. She reports current alcohol use of about 14.0 standard drinks per week. She reports that she does not use drugs. ? ?family history includes Breast cancer (age of onset: 75) in her mother; Heart  disease (age of onset: 80) in her father. ? ?No Known Allergies ? ?  ? ?PHYSICAL EXAMINATION: ?Vital signs: BP 98/72   Pulse 62   Ht '5\' 6"'$  (1.676 m)   Wt 141 lb (64 kg)   SpO2 96%   BMI 22.76 kg/m?   ?Constitutional: generally well-appearing, no acute distress ?Psychiatric: alert and oriented x3, cooperative ?Eyes: extraocular movements intact, anicteric, conjunctiva pink ?Mouth: oral pharynx moist, no lesions ?Neck: supple no lymphadenopathy ?Cardiovascular: heart regular rate and rhythm, no murmur ?Lungs: clear to auscultation bilaterally ?Abdomen: soft, nontender, nondistended, no obvious ascites, no peritoneal signs, normal bowel sounds, no organomegaly ?Rectal: ?Extremities: no lower extremity edema bilaterally ?Skin: no lesions on visible extremities ?Neuro: No focal deficits. No asterixis.  ? ? ? ?ASSESSMENT: ? ?1.  Functional dyspepsia.  Improved on Zoloft and PPI ?2.  IBS.  Improved on Zoloft. ?3.  Anxiety.  Improved on Zoloft ?4.  Normal upper endoscopy August 2020 ?5.  Normal colonoscopy March 2016 ?6.  Status postcholecystectomy ? ? ?PLAN: ? ?1.  Continue Zoloft 12.5 mg at night.  Medication refilled.  Medication risks reviewed ?2.  Continue omeprazole 20 mg daily.  Medication refilled.  Medication risks reviewed ?3.  Routine office follow-up 2 years.  Contact the office in the interim for any questions or problems ? ? ? ? ?  ?

## 2022-01-01 NOTE — Patient Instructions (Signed)
If you are age 77 or older, your body mass index should be between 23-30. Your Body mass index is 22.76 kg/m?Marland Kitchen If this is out of the aforementioned range listed, please consider follow up with your Primary Care Provider. ? ?If you are age 38 or younger, your body mass index should be between 19-25. Your Body mass index is 22.76 kg/m?Marland Kitchen If this is out of the aformentioned range listed, please consider follow up with your Primary Care Provider.  ? ?________________________________________________________ ? ?The [facility] providers would like to encourage you to use [facility] to communicate with providers for non-urgent requests or questions.  Due to long hold times on the telephone, sending your provider a message by [facility] may be a faster and more efficient way to get a response.  Please allow 48 business hours for a response.  Please remember that this is for non-urgent requests.  ?_______________________________________________________ ? ?We have sent the following medications to your pharmacy for you to pick up at your convenience:  Zoloft, Omeprazole ? ?

## 2022-04-23 ENCOUNTER — Ambulatory Visit (HOSPITAL_BASED_OUTPATIENT_CLINIC_OR_DEPARTMENT_OTHER): Payer: No Typology Code available for payment source | Admitting: Obstetrics & Gynecology

## 2022-07-30 ENCOUNTER — Encounter (HOSPITAL_BASED_OUTPATIENT_CLINIC_OR_DEPARTMENT_OTHER): Payer: Self-pay | Admitting: Obstetrics & Gynecology

## 2022-07-30 ENCOUNTER — Ambulatory Visit (INDEPENDENT_AMBULATORY_CARE_PROVIDER_SITE_OTHER): Payer: No Typology Code available for payment source | Admitting: Obstetrics & Gynecology

## 2022-07-30 VITALS — BP 138/88 | HR 62 | Ht 66.0 in | Wt 142.0 lb

## 2022-07-30 DIAGNOSIS — Z01419 Encounter for gynecological examination (general) (routine) without abnormal findings: Secondary | ICD-10-CM | POA: Diagnosis not present

## 2022-07-30 DIAGNOSIS — N952 Postmenopausal atrophic vaginitis: Secondary | ICD-10-CM | POA: Diagnosis not present

## 2022-07-30 DIAGNOSIS — R3915 Urgency of urination: Secondary | ICD-10-CM

## 2022-07-30 DIAGNOSIS — Z9012 Acquired absence of left breast and nipple: Secondary | ICD-10-CM | POA: Diagnosis not present

## 2022-07-30 DIAGNOSIS — Z853 Personal history of malignant neoplasm of breast: Secondary | ICD-10-CM

## 2022-07-30 LAB — POCT URINALYSIS DIPSTICK
Bilirubin, UA: NEGATIVE
Glucose, UA: NEGATIVE
Ketones, UA: NEGATIVE
Nitrite, UA: NEGATIVE
Protein, UA: NEGATIVE
Spec Grav, UA: 1.01 (ref 1.010–1.025)
Urobilinogen, UA: 0.2 E.U./dL
pH, UA: 6.5 (ref 5.0–8.0)

## 2022-07-30 MED ORDER — ESTRADIOL 10 MCG VA TABS: ORAL_TABLET | VAGINAL | 4 refills | Status: DC

## 2022-07-30 MED ORDER — NITROFURANTOIN MONOHYD MACRO 100 MG PO CAPS: 100.0000 mg | ORAL_CAPSULE | Freq: Two times a day (BID) | ORAL | 0 refills | Status: DC

## 2022-07-30 NOTE — Progress Notes (Signed)
76 y.o. G3P3 Married White or Caucasian female here for breast and pelvic exam.  She is having issues with urinary urgency that is intermittent for her.  She is wondering if this may be related to food or beverages.  For example, she had wine last night and her urgency symptoms are more significant today.  Denies vaginal bleeding or discharge.    No LMP recorded. Patient is postmenopausal.          Sexually active: Yes.    H/O STD:  no  Health Maintenance: PCP:  Dr. Joylene Draft.  Last wellness appt was 08/2021.  Did blood work at that appt:  Vaccines are up to date:  has not done influenza Colonoscopy:  11/17/2014 MMG:  04/2022 BMD:  Dr Joylene Draft Last pap smear:  2017   H/o abnormal pap smear:  no   reports that she quit smoking about 43 years ago. Her smoking use included cigarettes. She has never used smokeless tobacco. She reports current alcohol use of about 14.0 standard drinks of alcohol per week. She reports that she does not use drugs.  Past Medical History:  Diagnosis Date   Aortic aneurysm ([place])    cancer    left breast cancer   Diverticulosis of colon (without mention of hemorrhage)    Elevated LFTs    GERD (gastroesophageal reflux disease)    History of breast cancer 1995   Hyperlipidemia    Hypertension 2012    Past Surgical History:  Procedure Laterality Date   CHOLECYSTECTOMY  09/15/2012   Procedure: LAPAROSCOPIC CHOLECYSTECTOMY WITH INTRAOPERATIVE CHOLANGIOGRAM;  Surgeon: Haywood Lasso, MD;  Location: [facility];  Service: General;  Laterality: N/A;   [place]   left, followed by reconstruction    UPPER GASTROINTESTINAL ENDOSCOPY      Current Outpatient Medications  Medication Sig Dispense Refill   ascorbic acid (VITAMIN C) 1000 MG tablet Take 1,000 mg by mouth daily.      BYSTOLIC 5 MG tablet Take 2.5 mg by mouth at bedtime.      Estradiol 10 MCG TABS vaginal tablet 1 pv twice weekly 24 tablet 4   NASCOBAL 500 MCG/0.1ML SOLN Place into both  nostrils once a week.     omeprazole (PRILOSEC) 20 MG capsule TAKE ONE CAPSULE EACH DAY 90 capsule 3   rosuvastatin (CRESTOR) 5 MG tablet Take 1 tablet by mouth daily.     sertraline (ZOLOFT) 50 MG tablet Take 0.5 tablets (25 mg total) by mouth at bedtime. 30 tablet 6   No current facility-administered medications for this visit.    Family History  Problem Relation Age of Onset   Breast cancer Mother 70   Heart disease Father 43       2 MIs    Colon cancer Neg Hx    Stomach cancer Neg Hx    Esophageal cancer Neg Hx    Rectal cancer Neg Hx     Review of Systems  Constitutional: Negative.   Genitourinary: Negative.     Exam:   BP 138/88   Pulse 62   Ht '5\' 6"'$  (1.676 m) Comment: Reported  Wt 142 lb (64.4 kg)   BMI 22.92 kg/m   Height: '5\' 6"'$  (167.6 cm) (Reported)  General appearance: alert, cooperative and appears stated age Breasts: left implant present, no masses, skin change, nipple discharge, LAD with either breast  Abdomen: soft, non-tender; bowel sounds normal; no masses,  no organomegaly Lymph nodes: Cervical, supraclavicular, and axillary  nodes normal.  No abnormal inguinal nodes palpated Neurologic: Grossly normal  Pelvic: External genitalia:  no lesions              Urethra:  normal appearing urethra with no masses, tenderness or lesions              Bartholins and Skenes: normal                 Vagina: normal appearing vagina with atrophic changes and no discharge, no lesions              Cervix: no lesions              Pap taken: No. Bimanual Exam:  Uterus:  normal size, contour, position, consistency, mobility, non-tender              Adnexa: normal adnexa and no mass, fullness, tenderness               Rectovaginal: Confirms               Anus:  normal sphincter tone, no lesions  Chaperone, Octaviano Batty, CMA, was present for exam.  Assessment/Plan: 1. Encntr for gyn exam (general) (routine) w/o abn findings - Pap smear not indicated - Mammogram 04/2022 -  Colonoscopy 11/17/2014 - Bone mineral density done at [facility] - lab work done with PCP, Dr. Joylene Draft - vaccines reviewed/updated  2. Urinary urgency - POCT Urinalysis Dipstick - Urine Culture - nitrofurantoin, macrocrystal-monohydrate, (MACROBID) 100 MG capsule; Take 1 capsule (100 mg total) by mouth 2 (two) times daily.  Dispense: 10 capsule; Refill: 0  3. Vaginal atrophy - discussed adding small amount of vaginal estrogen cream to help with urinary symptoms as well.  Will wait until results are finalized.   - Estradiol 10 MCG TABS vaginal tablet; 1 pv twice weekly  Dispense: 24 tablet; Refill: 4  4. S/P left mastectomy  5. History of breast cancer

## 2022-07-31 LAB — URINE CULTURE

## 2022-08-02 ENCOUNTER — Ambulatory Visit (HOSPITAL_BASED_OUTPATIENT_CLINIC_OR_DEPARTMENT_OTHER): Payer: No Typology Code available for payment source | Admitting: Obstetrics & Gynecology

## 2022-08-20 ENCOUNTER — Other Ambulatory Visit (HOSPITAL_BASED_OUTPATIENT_CLINIC_OR_DEPARTMENT_OTHER): Payer: Self-pay | Admitting: Obstetrics & Gynecology

## 2022-08-20 ENCOUNTER — Other Ambulatory Visit: Payer: Self-pay | Admitting: Internal Medicine

## 2022-08-20 MED ORDER — PREMARIN 0.625 MG/GM VA CREA: TOPICAL_CREAM | VAGINAL | 3 refills | Status: DC

## 2022-12-10 ENCOUNTER — Telehealth: Payer: Self-pay | Admitting: Cardiology

## 2022-12-10 NOTE — Telephone Encounter (Signed)
STAT if HR is under 50 or over 120 (normal HR is 60-100 beats per minute)  What is your heart rate? 57-147  Do you have a log of your heart rate readings (document readings)?    Do you have any other symptoms? no  Patient is out of town and looking to speak to the dr about the episodes she is having

## 2022-12-10 NOTE — Telephone Encounter (Signed)
Called pt she states "I'm in [place], I have been here for the last 3-4 months. March 29th. Woke up with fast heart rate. 140-145 bpm at that time." Good Friday pt had an EKG - it was normal. At office visit it was normal.  Pt wore her apple watch for 1 week, nothing happened. Last night rapid heart rate 80-124 it lasted 4 hours. "No issues today, no chest pain, no shortness of breath." April 15th. I wanted to see Dr. Antoine Poche, I have an appointment with a PA at the end of April but I really wanted to see Dr. Antoine Poche and get his advise. Pt added to DOD schedule. Pt advised to maintain hydration and limit alcohol consumption.

## 2022-12-11 NOTE — Telephone Encounter (Signed)
Called pt, she states she can not come in on Friday. She states her symptoms happen at night around 12. I wonder if it's because I can not absorb alcohol like I use to. Pt very thankful Dr. Antoine Poche reached out and appreciates him.

## 2022-12-12 NOTE — Progress Notes (Signed)
Cardiology Office Note:   Date:  12/13/2022  ID:  Patricia Flynn, DOB Aug 11, 1946, MRN [id number]  History of Present Illness:   Patricia Flynn is a 77 y.o. female who presents was referred by Rodrigo Ran, MD for evaluation of palpitations.   She has no past history other than HTN and a negative perfusion study 2012.      I sent her for a CT which demonstrated no coronary calcium although there was mild aortic dilatation.  An echo was done and there were no significant abnormalities.  EF was low normal.  She had a monitor which demonstrated some paroxysmal atrial tachycardia but no atrial fibrillation.  SVT was nonsustained.   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    This has been stable on follow up.    She called recently reporting palpitations.  She was in [place].  She states she has had 4 episodes of tachypalpitations.  One of them when she woke up to go to the bathroom and she felt her heart beating fast.  She went back to bed.  Another time she had been in her usual walking.  She worked out with a Psychologist, educational and then came back home.  She was really removed paper.  She had thought her heart rate was elevated when she took it on a blood pressure cuff it was 150s to 160s.  She called her physician in [place] but by the time she had an EKG done her arrhythmia had suddenly stopped.  She had another couple of episodes since then 1 lasting 3 hours and 1 lasting probably about 45 minutes.  The rates have been in the 130s to 160s.  She feels it racing.  She is not having any presyncope or syncope.  She is not having any chest pressure, neck or arm discomfort.  She is otherwise been well and physically active.  ROS: As stated in the HPI and negative for all other systems.  Studies Reviewed:    EKG: Sinus rhythm, rate 65, axis within normal limits, intervals within normal limits, no acute ST-T wave changes.  Risk Assessment/Calculations:     Physical  Exam:   VS:  BP 116/74 (BP Location: Left Arm, Patient Position: Sitting, Cuff Size: Normal)   Pulse 65   Ht  (1.676 m)   Wt 142 lb (64.4 kg)   SpO2 94%   BMI 22.92 kg/m    Wt Readings from Last 3 Encounters:  12/13/22 142 lb (64.4 kg)  07/30/22 142 lb (64.4 kg)  01/01/22 141 lb (64 kg)     GEN: Well nourished, well developed in no acute distress NECK: No JVD; No carotid bruits CARDIAC: RRR, no murmurs, rubs, gallops RESPIRATORY:  Clear to auscultation without rales, wheezing or rhonchi  ABDOMEN: Soft, non-tender, non-distended EXTREMITIES:  No edema; No deformity   ASSESSMENT AND PLAN:   PALPITATIONS:   I am going to start with a 4-week monitor.  She did have electrolytes and TSH done in the last few months and these were normal.  We talked about vagal maneuvers.  I am also going to give her Cardizem 30 mg to be used if she has any sustained tachyarrhythmias.   HTN:  The blood pressure is at target.  No change in therapy.   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  I ordered a follow up CT.  Signed, Minus Breeding, MD

## 2022-12-12 NOTE — Telephone Encounter (Signed)
Called patient, advised of message below.   Patient was scheduled, Dr.Hochrein had a 3:00 spot open for tomorrow.  Patient scheduled for this visit instead of at 1:00 PM.   Will notify MD.  Thanks!

## 2022-12-12 NOTE — Telephone Encounter (Signed)
Patient called wanting to speak to the nurse she spoke to yesterday. She said she can make the appt she was offered for Friday with Dr. [place] Lions at 1pm.

## 2022-12-13 ENCOUNTER — Ambulatory Visit: Payer: 59 | Attending: Cardiology | Admitting: Cardiology

## 2022-12-13 ENCOUNTER — Encounter: Payer: Self-pay | Admitting: Cardiology

## 2022-12-13 VITALS — BP 116/74 | HR 65 | Ht 66.0 in | Wt 142.0 lb

## 2022-12-13 DIAGNOSIS — I77810 Thoracic aortic ectasia: Secondary | ICD-10-CM | POA: Diagnosis not present

## 2022-12-13 DIAGNOSIS — R002 Palpitations: Secondary | ICD-10-CM

## 2022-12-13 DIAGNOSIS — I10 Essential (primary) hypertension: Secondary | ICD-10-CM | POA: Diagnosis not present

## 2022-12-13 MED ORDER — DILTIAZEM HCL 30 MG PO TABS: 30.0000 mg | ORAL_TABLET | ORAL | 6 refills | Status: DC | PRN

## 2022-12-13 NOTE — Patient Instructions (Addendum)
Medication Instructions:   Cardizem 30 mg  may I=use as needed for  fast heartrate Recommendations for vagal maneuvers: "Bearing down" Coughing Gagging Icy, cold towel on face or drink ice cold water     *If you need a refill on your cardiac medications before your next appointment, please call your pharmacy*   Lab Work: BMP   week prior to CT of aorta    Testing/Procedures: Will be mailed to your hoe 3 to 7 days  Your physician has recommended that you wear an event monitor 30 days [employer] . Event monitors are medical devices that record the heart's electrical activity. Doctors most often [place] these monitors to diagnose arrhythmias. Arrhythmias are problems with the speed or rhythm of the heartbeat. The monitor is a small, portable device. You can wear one while you do your normal daily activities. This is usually used to diagnose what is causing palpitations/syncope (passing out).   And Will be schedule at  [facility]  Non-Cardiac CT Angiography (CTA), is a special type of CT scan that uses a computer to produce multi-dimensional views of major blood vessel of Aorta . In CT angiography, a contrast material is injected through an IV to help visualize the blood vessels   Follow-Up: At [facility], you and your health needs are our priority.  As part of our continuing mission to provide you with exceptional heart care, we have created designated Provider Care Teams.  These Care Teams include your primary Cardiologist (physician) and Advanced Practice Providers (APPs -  Physician Assistants and Nurse Practitioners) who all work together to provide you with the care you need, when you need it.     Your next appointment:   2 month(s)  The format for your next appointment:   In Person  Provider:   [name], MD    Other Instructions    Contact Dr Antoine Poche by MyChart if needed   [employer] Cardiac Event Monitor Instructions  Your physician has requested  you wear your cardiac event monitor for __30___ days, (1-30). [employer] may call or text to confirm a shipping address. The monitor will be sent to a land address via UPS. [employer] will not ship a monitor to a PO BOX. It typically takes 3-5 days to receive your monitor after it has been enrolled. [employer] will assist with USPS tracking if your package is delayed. The telephone number for [employer] is [phone]. Once you have received your monitor, please review the enclosed instructions. Instruction tutorials can also be viewed under help and settings on the enclosed cell phone. Your monitor has already been registered assigning a specific monitor serial # to you.  Billing and Self Pay Discount Information  [employer] has been provided the insurance information we had on file for you.  If your insurance has been updated, please call [employer] at [phone] to provide them with your updated insurance information.   [employer] offers a discounted Self Pay option for patients who have insurance that does not cover their cardiac event monitor or patients without insurance.  The discounted cost of a Self Pay Cardiac Event Monitor would be $225.00 , if the patient contacts [employer] at [phone] within 7 days of applying the monitor to make payment arrangements.  If the patient does not contact [employer] within 7 days of applying the monitor, the cost of the cardiac event monitor will be $350.00.  Applying the monitor  Remove cell phone from case and turn it on. The cell phone works as your  transmitter and needs to be within 10 feet of you at all times. The cell phone will need to be charged on a daily basis. We recommend you plug the cell phone into the enclosed charger at your bedside table every night.  Monitor batteries: You will receive two monitor batteries labelled #1 and #2. These are your recorders. Plug battery #2 onto the second connection on the enclosed charger. Keep  one battery on the charger at all times. This will keep the monitor battery deactivated. It will also keep it fully charged for when you need to switch your monitor batteries. A small light will be blinking on the battery emblem when it is charging. The light on the battery emblem will remain on when the battery is fully charged.  Open package of a Monitor strip. Insert battery #1 into black hood on strip and gently squeeze monitor battery onto connection as indicated in instruction booklet. Set aside while preparing skin.  Choose location for your strip, vertical or horizontal, as indicated in the instruction booklet. Shave to remove all hair from location. There cannot be any lotions, oils, powders, or colognes on skin where monitor is to be applied. Wipe skin clean with enclosed Saline wipe. Dry skin completely.  Peel paper labeled #1 off the back of the Monitor strip exposing the adhesive. Place the monitor on the chest in the vertical or horizontal position shown in the instruction booklet. One arrow on the monitor strip must be pointing upward. Carefully remove paper labeled #2, attaching remainder of strip to your skin. Try not to create any folds or wrinkles in the strip as you apply it.  Firmly press and release the circle in the center of the monitor battery. You will hear a small beep. This is turning the monitor battery on. The heart emblem on the monitor battery will light up every 5 seconds if the monitor battery in turned on and connected to the patient securely. Do not push and hold the circle down as this turns the monitor battery off. The cell phone will locate the monitor battery. A screen will appear on the cell phone checking the connection of your monitor strip. This may read poor connection initially but change to good connection within the next minute. Once your monitor accepts the connection you will hear a series of 3 beeps followed by a climbing crescendo of beeps. A  screen will appear on the cell phone showing the two monitor strip placement options. Touch the picture that demonstrates where you applied the monitor strip.  Your monitor strip and battery are waterproof. You are able to shower, bathe, or swim with the monitor on. They just ask you do not submerge deeper than 3 feet underwater. We recommend removing the monitor if you are swimming in a lake, river, or ocean.  Your monitor battery will need to be switched to a fully charged monitor battery approximately once a week. The cell phone will alert you of an action which needs to be made.  On the cell phone, tap for details to reveal connection status, monitor battery status, and cell phone battery status. The green dots indicates your monitor is in good status. A red dot indicates there is something that needs your attention.  To record a symptom, click the circle on the monitor battery. In 30-60 seconds a list of symptoms will appear on the cell phone. Select your symptom and tap save. Your monitor will record a sustained or significant arrhythmia regardless of you  clicking the button. Some patients do not feel the heart rhythm irregularities. [employer] will notify us of any serious or critical events.  Refer to instruction booklet for instructions on switching batteries, changing strips, the Do not disturb or Pause features, or any additional questions.  Call [employer] at [phone], to confirm your monitor is transmitting and record your baseline. They will answer any questions you may have regarding the monitor instructions at that time.  Returning the monitor to [employer]  Place all equipment back into blue box. Peel off strip of paper to expose adhesive and close box securely. There is a prepaid UPS shipping label on this box. Drop in a UPS drop box, or at a UPS facility like [employer]. You may also contact [employer] to arrange UPS to pick up monitor package at your home

## 2022-12-14 LAB — BASIC METABOLIC PANEL
BUN/Creatinine Ratio: 12 (ref 12–28)
BUN: 12 mg/dL (ref 8–27)
CO2: 23 mmol/L (ref 20–29)
Calcium: 9.6 mg/dL (ref 8.7–10.3)
Chloride: 105 mmol/L (ref 96–106)
Creatinine, Ser: 0.99 mg/dL (ref 0.57–1.00)
Glucose: 77 mg/dL (ref 70–99)
Potassium: 4.2 mmol/L (ref 3.5–5.2)
Sodium: 143 mmol/L (ref 134–144)
eGFR: 59 mL/min/{1.73_m2} — ABNORMAL LOW (ref >59)

## 2022-12-16 DIAGNOSIS — R002 Palpitations: Secondary | ICD-10-CM | POA: Diagnosis not present

## 2022-12-17 ENCOUNTER — Ambulatory Visit: Payer: No Typology Code available for payment source | Admitting: Cardiovascular Disease

## 2022-12-27 ENCOUNTER — Ambulatory Visit: Payer: No Typology Code available for payment source | Admitting: Student

## 2022-12-30 ENCOUNTER — Telehealth: Payer: Self-pay | Admitting: Cardiology

## 2022-12-30 NOTE — Telephone Encounter (Signed)
Pt c/o BP issue: STAT if pt c/o blurred vision, one-sided weakness or slurred speech  1. What are your last 5 BP readings? 140/95; 152/102  2. Are you having any other symptoms (ex. Dizziness, headache, blurred vision, passed out)? no  3. What is your BP issue?   Patient is concern because she is wearing heart monitor.  Please advise

## 2022-12-30 NOTE — Telephone Encounter (Signed)
Returned call to patient who states that she has been having issues with her blood pressure. Patient reports that yesterday after her 4 mile walk she could hear her HR going (states no accelerated HR reports HR was 60) patient states her BP was 142/102 when she checked during this episode. Patient reports that today her BP after her workout is 138/87. Patient denies any chest pain, shortness of breath, dizziness or lightheadedness. Patient is very concerned due to elevated BP from her normal. Patient states that she also fell last week on her walk and hit her head. Patient states she was checked at by urgent care visit- has one stitch and black eye. Patient denies any dizziness or any cause of fall-states that she tripped. Patient reports no recent increase in sodium or any other changes. Moved patients appointment up to 5/23- with Dr. Antoine Poche after testing completed. Patient states that she is still wearing monitor. Advised patient to monitor BP and write these numbers down. Will forward to MD

## 2022-12-30 NOTE — Telephone Encounter (Signed)
Returned call to patient and made her aware of the below:patient aware and verbalized understanding.   [name], MD  You2 hours ago (1:38 PM)    Please asked the patient to take her blood pressure 3 times a day for the next week and either drop these off at the office or send them via MyChart.  I will likely prescribe Cardizem daily instead of as needed.  I did talk with her the other day about paroxysmal atrial fibrillation that she is having and Can decide about need for anticoagulation.  She can continue to wear the monitor.

## 2023-01-01 ENCOUNTER — Ambulatory Visit (HOSPITAL_BASED_OUTPATIENT_CLINIC_OR_DEPARTMENT_OTHER)
Admission: RE | Admit: 2023-01-01 | Discharge: 2023-01-01 | Disposition: A | Discharge status: Home / Self Care | Payer: 59 | Source: Ambulatory Visit | Attending: Cardiology | Admitting: Cardiology

## 2023-01-01 ENCOUNTER — Encounter (HOSPITAL_BASED_OUTPATIENT_CLINIC_OR_DEPARTMENT_OTHER): Payer: Self-pay

## 2023-01-01 DIAGNOSIS — I77810 Thoracic aortic ectasia: Secondary | ICD-10-CM

## 2023-01-01 MED ORDER — IOHEXOL 350 MG/ML SOLN
100.0000 mL | Freq: Once | INTRAVENOUS | Status: AC | PRN
  Administered 2023-01-01: 60 mL via INTRAVENOUS

## 2023-01-02 ENCOUNTER — Other Ambulatory Visit: Payer: Self-pay | Admitting: Internal Medicine

## 2023-01-07 ENCOUNTER — Encounter: Payer: Self-pay | Admitting: Cardiology

## 2023-01-13 ENCOUNTER — Ambulatory Visit: Payer: No Typology Code available for payment source | Admitting: Cardiology

## 2023-01-21 ENCOUNTER — Ambulatory Visit: Payer: 59 | Attending: Cardiology

## 2023-01-21 DIAGNOSIS — I4892 Unspecified atrial flutter: Secondary | ICD-10-CM | POA: Insufficient documentation

## 2023-01-21 DIAGNOSIS — R002 Palpitations: Secondary | ICD-10-CM

## 2023-01-21 NOTE — Progress Notes (Signed)
Cardiology Office Note:   Date:  01/23/2023  ID:  [name], DOB 16-Mar-1946, MRN [id number]  History of Present Illness:   Patricia Flynn is a 77 y.o. female  who was referred by Rodrigo Ran, MD for evaluation of palpitations.   She has no past history other than HTN and a negative perfusion study 2012.       I sent her for a CT which demonstrated no coronary calcium although there was mild aortic dilatation.  An echo was done and there were no significant abnormalities.  EF was low normal.  She had a monitor which demonstrated some paroxysmal atrial tachycardia but no atrial fibrillation.  SVT was nonsustained.   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    This has been stable on follow up.  She had palpitations and now has been found to have short bursts of atrial flutter with variable conduction on monitor.  She also has some coarse atrial fib.  She really has not felt a lot of palpitations.  She is set up for a watch and she is spending less than 3% of the time in fibrillation according to the watch but I did not see any strips.  She thinks she was in it for about an hour not long ago but she has not had to take any as needed Cardizem which was given previously.     ROS: As stated in the HPI and negative for all other systems.  Studies Reviewed:    EKG:  NA    Risk Assessment/Calculations:    CHA2DS2-VASc Score = 3   This indicates a 3.2% annual risk of stroke. The patient's score is based upon: CHF History: 0 HTN History: 0 Diabetes History: 0 Stroke History: 0 Vascular Disease History: 0 Age Score: 2 Gender Score: 1       Physical Exam:   VS:  BP 127/89 (BP Location: Right Arm, Patient Position: Sitting, Cuff Size: Normal)   Pulse 64   Ht 5\' 5"  (1.651 m)   Wt 139 lb 12.8 oz (63.4 kg)   SpO2 98%   BMI 23.26 kg/m    Wt Readings from Last 3 Encounters:  01/23/23 139 lb 12.8 oz (63.4 kg)  12/13/22 142 lb (64.4 kg)   07/30/22 142 lb (64.4 kg)     GEN: Well nourished, well developed in no acute distress NECK: No JVD; No carotid bruits CARDIAC: RRR, no murmurs, rubs, gallops RESPIRATORY:  Clear to auscultation without rales, wheezing or rhonchi  ABDOMEN: Soft, non-tender, non-distended EXTREMITIES:  No edema; No deformity   ASSESSMENT AND PLAN:   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  No change in therapy.  Atrial flutter: We had a long discussion about this.  She could be considered for ablation but so far she has had a slight burden and not particularly symptomatic since she got back from [place].  I have recommended Eliquis and she has no contraindications we will begin this and check a CBC in a couple of weeks.  She will keep a check on her rhythm on her Apple Watch.  She would let me know if she has any increasing symptoms at which point we could consider ablation.  She and I had a long discussion about this.  Hypertension: Her blood pressure has been well-controlled.  Continue the meds as listed.        Signed,  [name], MD

## 2023-01-23 ENCOUNTER — Encounter: Payer: Self-pay | Admitting: Cardiology

## 2023-01-23 ENCOUNTER — Ambulatory Visit: Payer: 59 | Attending: Cardiology | Admitting: Cardiology

## 2023-01-23 VITALS — BP 127/89 | HR 64 | Ht 65.0 in | Wt 139.8 lb

## 2023-01-23 DIAGNOSIS — I4892 Unspecified atrial flutter: Secondary | ICD-10-CM | POA: Diagnosis not present

## 2023-01-23 MED ORDER — APIXABAN 5 MG PO TABS: 5.0000 mg | ORAL_TABLET | Freq: Two times a day (BID) | ORAL | 6 refills | Status: DC

## 2023-01-23 NOTE — Patient Instructions (Signed)
Medication Instructions:   START ELIQUIS 5 MG ONE TABLET TWICE DAILY  *If you need a refill on your cardiac medications before your next appointment, please call your pharmacy*   Lab Work:  Your physician recommends that you return for lab work in: ONE MONTH-DO NOT NEED TO FAST  If you have labs (blood work) drawn today and your tests are completely normal, you will receive your results only by: MyChart Message (if you have MyChart) OR A paper copy in the mail If you have any lab test that is abnormal or we need to change your treatment, we will call you to review the results.   Follow-Up: At [facility], you and your health needs are our priority.  As part of our continuing mission to provide you with exceptional heart care, we have created designated Provider Care Teams.  These Care Teams include your primary Cardiologist (physician) and Advanced Practice Providers (APPs -  Physician Assistants and Nurse Practitioners) who all work together to provide you with the care you need, when you need it.  We recommend signing up for the patient portal called "MyChart".  Sign up information is provided on this After Visit Summary.  MyChart is used to connect with patients for Virtual Visits (Telemedicine).  Patients are able to view lab/test results, encounter notes, upcoming appointments, etc.  Non-urgent messages can be sent to your provider as well.   To learn more about what you can do with MyChart, go to [email or web address].    Your next appointment:   2 month(s)  Provider:   [name], MD

## 2023-01-29 ENCOUNTER — Ambulatory Visit: Payer: 59 | Admitting: Cardiology

## 2023-02-05 ENCOUNTER — Telehealth: Payer: Self-pay | Admitting: Cardiology

## 2023-02-05 NOTE — Telephone Encounter (Signed)
Patient is calling to check to see if there was anything she should do with her medication before she has a dental procedure.

## 2023-02-05 NOTE — Telephone Encounter (Signed)
Left detailed message for dentist to fax pre-op request to pre-op department.

## 2023-02-26 LAB — CBC
Hematocrit: 40.6 % (ref 34.0–46.6)
Hemoglobin: 13.1 g/dL (ref 11.1–15.9)
MCH: 30.2 pg (ref 26.6–33.0)
MCHC: 32.3 g/dL (ref 31.5–35.7)
MCV: 94 fL (ref 79–97)
Platelets: 190 10*3/uL (ref 150–450)
RBC: 4.34 x10E6/uL (ref 3.77–5.28)
RDW: 11.8 % (ref 11.7–15.4)
WBC: 3.5 10*3/uL (ref 3.4–10.8)

## 2023-02-27 ENCOUNTER — Encounter: Payer: Self-pay | Admitting: *Deleted

## 2023-04-06 NOTE — Progress Notes (Unsigned)
Cardiology Office Note:   Date:  04/07/2023  ID:  Patricia Flynn, DOB September 19, 1945, MRN [id number] PCP: Rodrigo Ran, MD  [facility] Providers Cardiologist:  [name], MD {   History of Present Illness:   Patricia Flynn is a 77 y.o. female who was referred by Rodrigo Ran, MD for evaluation of palpitations.   She has no past history other than HTN and a negative perfusion study 2012.    I sent her for a CT which demonstrated no coronary calcium although there was mild aortic dilatation.  An echo was done and there were no significant abnormalities.  EF was low normal.  She had a monitor which demonstrated some paroxysmal atrial tachycardia but no atrial fibrillation.  SVT was nonsustained.   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    This has been stable on follow up.  She had palpitations and now has been found to have short bursts of atrial flutter with variable conduction on monitor.  She also has some coarse atrial fib.  She stopped drinking alcohol and has done well with very few palpitations.  She is however, feeling a somewhat strange fullness in her head.  She does not think it feels like vertigo and she has not necessarily dizzy.  She has not felt palpitations and has had no presyncope or syncope.  She wondered if it could have been related to Eliquis and she stopped that for a week and she did feel better.  She resumed it however wanting to talk to me first.  She had been on Zoloft but she is weaned herself off of this.  She is not having any chest pressure, neck or arm discomfort.  She exercises routinely most days.   ROS: As stated in the HPI and negative for all other systems.  Studies Reviewed:    EKG:    NA  Risk Assessment/Calculations:    CHA2DS2-VASc Score = 3  This indicates a 3.2% annual risk of stroke. The patient's score is based upon:   Physical Exam:   VS:  BP 116/82   Pulse 61   Ht 5\' 6"  (1.676 m)   Wt  141 lb 3.2 oz (64 kg)   SpO2 95%   BMI 22.79 kg/m    Wt Readings from Last 3 Encounters:  04/07/23 141 lb 3.2 oz (64 kg)  01/23/23 139 lb 12.8 oz (63.4 kg)  12/13/22 142 lb (64.4 kg)     GEN: Well nourished, well developed in no acute distress NECK: No JVD; No carotid bruits CARDIAC: RRR, no murmurs, rubs, gallops RESPIRATORY:  Clear to auscultation without rales, wheezing or rhonchi  ABDOMEN: Soft, non-tender, non-distended EXTREMITIES:  No edema; No deformity   ASSESSMENT AND PLAN:   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  I will follow this up next May with a repeat CT angiogram.   Atrial flutter:   She is optically bothered by these.  We had a long discussion about whether the Eliquis could have been causing her head fullness and I would put this lower on the list.  I like to try a lower dose of Bystolic 2 and half milligrams first to see if this helps this complaint.  We might eventually have to get rid of the Eliquis although I would not suggest to be the culprit.  I also like her to see ENT.   Hypertension:  Her blood pressure is controlled and she will keep an eye on this as she takes her lower dose of diastolic.              Follow up me in six months.   Signed, [name], MD

## 2023-04-07 ENCOUNTER — Ambulatory Visit: Payer: 59 | Admitting: Cardiology

## 2023-04-07 ENCOUNTER — Ambulatory Visit: Payer: 59 | Attending: Cardiology | Admitting: Cardiology

## 2023-04-07 ENCOUNTER — Encounter: Payer: Self-pay | Admitting: Cardiology

## 2023-04-07 VITALS — BP 116/82 | HR 61 | Ht 66.0 in | Wt 141.2 lb

## 2023-04-07 DIAGNOSIS — I4892 Unspecified atrial flutter: Secondary | ICD-10-CM

## 2023-04-07 DIAGNOSIS — I10 Essential (primary) hypertension: Secondary | ICD-10-CM

## 2023-04-07 DIAGNOSIS — I77810 Thoracic aortic ectasia: Secondary | ICD-10-CM | POA: Diagnosis not present

## 2023-04-07 MED ORDER — NEBIVOLOL HCL 2.5 MG PO TABS: 2.5000 mg | ORAL_TABLET | Freq: Every day | ORAL | 3 refills | Status: DC

## 2023-04-07 NOTE — Patient Instructions (Addendum)
Medication Instructions:  Your physician has recommended you make the following change in your medication:   -Decrease nebivolol (bystolic) to 2.5mg  daily.  *If you need a refill on your cardiac medications before your next appointment, please call your pharmacy*    Testing/Procedures: Non-Cardiac CT Angiography (CTA) chest/aorta, is a special type of CT scan that uses a computer to produce multi-dimensional views of major blood vessels throughout the body. In CT angiography, a contrast material is injected through an IV to help visualize the blood vessels. To be done in May 2025.    Follow-Up: At [facility], you and your health needs are our priority.  As part of our continuing mission to provide you with exceptional heart care, we have created designated Provider Care Teams.  These Care Teams include your primary Cardiologist (physician) and Advanced Practice Providers (APPs -  Physician Assistants and Nurse Practitioners) who all work together to provide you with the care you need, when you need it.  We recommend signing up for the patient portal called "MyChart".  Sign up information is provided on this After Visit Summary.  MyChart is used to connect with patients for Virtual Visits (Telemedicine).  Patients are able to view lab/test results, encounter notes, upcoming appointments, etc.  Non-urgent messages can be sent to your provider as well.   To learn more about what you can do with MyChart, go to [email or web address].    Your next appointment:   6 month(s)  Provider:   [name], MD

## 2023-04-14 ENCOUNTER — Telehealth: Payer: Self-pay | Admitting: Pharmacy Technician

## 2023-04-14 ENCOUNTER — Other Ambulatory Visit (HOSPITAL_COMMUNITY): Payer: Self-pay

## 2023-04-14 NOTE — Telephone Encounter (Signed)
Pharmacy Patient Advocate Encounter   Received notification from CoverMyMeds that prior authorization for Nebivolol HCl 2.5MG  tablets is required/requested.   Insurance verification completed.   The patient is insured through [facility] .   Per test claim: PA required; PA started via CoverMyMeds. KEY  [id number] . Waiting for clinical questions to populate.

## 2023-04-14 NOTE — Telephone Encounter (Signed)
Pharmacy Patient Advocate Encounter   Received notification from CoverMyMeds that prior authorization for Nebivolol hcl 2.5Mg  tab is required/requested.   Insurance verification completed.   The patient is insured through [facility] .   Per test claim: PA required; PA submitted to [facility] via CoverMyMeds Key/confirmation #/EOC [facility] Status is pending

## 2023-04-15 NOTE — Telephone Encounter (Signed)
Pharmacy Patient Advocate Encounter  Received notification from [facility] that Prior Authorization for Nebivolol hcl 2.5 Mg tab has been DENIED. Please advise how you'd like to proceed. Full denial letter will be uploaded to the media tab. See denial reason below.   PA #/Case ID/Reference #: [id number]

## 2023-04-28 ENCOUNTER — Other Ambulatory Visit (HOSPITAL_COMMUNITY): Payer: Self-pay

## 2023-04-29 NOTE — Telephone Encounter (Signed)
Routed to primary MD, primary nurse, PharmD team for assitance

## 2023-04-29 NOTE — Telephone Encounter (Signed)
Looks like denial was because nebivolol is not on pt's formulary anymore. Covered beta blockers include (but are probably not limited to) atenolol, bisoprolol, and metoprolol. Can defer to MD for any specific preference for other beta blocker therapy.

## 2023-05-01 NOTE — Telephone Encounter (Signed)
[name], MD  You; Cv Div Nl [phone] minutes ago (3:16 PM)    Let us try a 2.5 mg dose of bisoprolol PO daily.   Called pt with recommendations from Dr. Antoine Poche. Pt states that she recently filled this prescription and had no issues. Pt states she is confused because she has been on this medication for years. Pt also [place] and will look into this when she returns. She will call us back if she needs medication to be changed, etc.

## 2023-05-26 ENCOUNTER — Other Ambulatory Visit (HOSPITAL_COMMUNITY): Payer: Self-pay

## 2023-05-27 ENCOUNTER — Telehealth: Payer: Self-pay | Admitting: Cardiology

## 2023-05-27 MED ORDER — NEBIVOLOL HCL 2.5 MG PO TABS: 2.5000 mg | ORAL_TABLET | Freq: Every day | ORAL | 3 refills | Status: DC

## 2023-05-27 NOTE — Telephone Encounter (Signed)
*  STAT* If patient is at the pharmacy, call can be transferred to refill team.   1. Which medications need to be refilled? (please list name of each medication and dose if known)   nebivolol (BYSTOLIC) 2.5 MG tablet   NEW PHARMACY - PREVIOUSLY DENIED BY INSURANCE BUT PT STATES SHE HAS A COUPON FOR [facility]   4. Which pharmacy/location (including street and city if local pharmacy) is medication to be sent to?   [id number] [place], [place] - [address] AT [facility] [address] Phone: [phone]  Fax: [phone]     5. Do they need a 30 day or 90 day supply? 90

## 2023-05-27 NOTE — Telephone Encounter (Signed)
Refills has been sent to the pharmacy. 

## 2023-06-25 ENCOUNTER — Encounter (INDEPENDENT_AMBULATORY_CARE_PROVIDER_SITE_OTHER): Payer: Self-pay

## 2023-06-25 ENCOUNTER — Ambulatory Visit (INDEPENDENT_AMBULATORY_CARE_PROVIDER_SITE_OTHER): Payer: 59 | Admitting: Otolaryngology

## 2023-06-25 VITALS — Ht 66.0 in | Wt 139.0 lb

## 2023-06-25 DIAGNOSIS — J343 Hypertrophy of nasal turbinates: Secondary | ICD-10-CM

## 2023-06-25 DIAGNOSIS — J310 Chronic rhinitis: Secondary | ICD-10-CM | POA: Diagnosis not present

## 2023-06-29 DIAGNOSIS — J310 Chronic rhinitis: Secondary | ICD-10-CM | POA: Insufficient documentation

## 2023-06-29 DIAGNOSIS — J343 Hypertrophy of nasal turbinates: Secondary | ICD-10-CM | POA: Insufficient documentation

## 2023-06-29 NOTE — Progress Notes (Signed)
Patient ID: Patricia Flynn, female   DOB: November 19, 1945, 77 y.o.   MRN: [id number]  Follow-up: Recurrent sinusitis  HPI: The patient is a 77 year old female who returns today for her follow-up evaluation.  She was last seen 2 months ago.  At that time, she was complaining of recurrent sinusitis.  She was noted to have erythematous and edematous nasal mucosa, with bilateral inferior turbinate hypertrophy.  She was treated with levofloxacin and prednisone Dosepak.  She was also on Nasacort nasal spray.  The patient returns today reporting resolution of her sinusitis.  She denies any facial pain, fever, or visual change.  She still has occasional nasal congestion.  Exam: General: Communicates without difficulty, well nourished, no acute distress. Head: Normocephalic, no evidence injury, no tenderness, facial buttresses intact without stepoff. Face/sinus: No tenderness to palpation and percussion. Facial movement is normal and symmetric. Eyes: PERRL, EOMI. No scleral icterus, conjunctivae clear. Neuro: CN II exam reveals vision grossly intact.  No nystagmus at any point of gaze. Ears: Auricles well formed without lesions.  Ear canals are intact without mass or lesion.  No erythema or edema is appreciated.  The TMs are intact without fluid. Nose: External evaluation reveals normal support and skin without lesions.  Dorsum is intact.  Anterior rhinoscopy reveals congested mucosa over anterior aspect of inferior turbinates and intact septum.  No purulence noted. Oral:  Oral cavity and oropharynx are intact, symmetric, without erythema or edema.  Mucosa is moist without lesions. Neck: Full range of motion without pain.  There is no significant lymphadenopathy.  No masses palpable.  Thyroid bed within normal limits to palpation.  Parotid glands and submandibular glands equal bilaterally without mass.  Trachea is midline. Neuro:  CN 2-12 grossly intact.    Assessment: 1.  The patient's acute sinusitis has resolved.  No  erythema or purulent drainage is noted today. 2.  Chronic rhinitis with nasal mucosal congestion and bilateral inferior turbinate hypertrophy.  Plan: 1.  The physical exam findings are reviewed with the patient. 2.  Flonase nasal spray 2 sprays each nostril daily. 3.  Nasal saline irrigation is encouraged. 4.  The patient will return for reevaluation in 5 months, sooner if needed.

## 2023-08-19 ENCOUNTER — Telehealth: Payer: Self-pay | Admitting: Cardiology

## 2023-08-19 ENCOUNTER — Other Ambulatory Visit (HOSPITAL_BASED_OUTPATIENT_CLINIC_OR_DEPARTMENT_OTHER): Payer: Self-pay | Admitting: Obstetrics & Gynecology

## 2023-08-19 DIAGNOSIS — N952 Postmenopausal atrophic vaginitis: Secondary | ICD-10-CM

## 2023-08-19 NOTE — Telephone Encounter (Signed)
Called and spoke to patient when is calling about tachycardia and her BP being elevated. Patient report she was started on Prednisone 6 days ago that was prescribed by Dr Everardo Pacific at [facility] for sciatica pain. She is on the Prednisone 10 mg tablets,dose pack. She stated she started taking it 12/11. The tachycardia and elevated BP started Sunday 12/15. She report her BP yesterday was 150/107, HR 125 and she was jittery. She denied chest pain, SOB, dizziness, lightheadedness or any other symptoms. She took her last dose yesterday at 7 am and does not plan on taking it anymore due to symptoms. Patient stated she feels much better today and deny any symptoms at this time. Her BP is 140/96 and HR 64 today. She is scheduled to see Orthopedic specialist tomorrow who may consider doing an injection but she does not know what type of injection. Patient would like to see Dr Antoine Poche and was told his first available is 09/12/23. Offered her an appt with an APP and the first available with one of them is also 1/10 as well. She stated " Dr Antoine Poche would want to see me right away if he knew I was having these symptoms." She was told that I could not put her on his schedule or double book him without asking him. She asked if I would send a message asking him to see her one day this week or early next week because she will be is leaving to go to [place] for 4 months. Please advise.

## 2023-08-19 NOTE — Telephone Encounter (Signed)
Pt c/o medication issue:  1. Name of Medication: Prednisone   2. How are you currently taking this medication (dosage and times per day)? As written  3. Are you having a reaction (difficulty breathing--STAT)? Heart racing  4. What is your medication issue? Pt was put on medication for sciatica and she thinks this medication may have caused her racing heart or could it be something else. Please advise

## 2023-08-21 ENCOUNTER — Encounter: Payer: Self-pay | Admitting: Cardiology

## 2023-08-21 ENCOUNTER — Ambulatory Visit: Payer: 59 | Attending: Cardiology | Admitting: Cardiology

## 2023-08-21 VITALS — BP 135/89 | HR 74 | Ht 66.0 in | Wt 138.0 lb

## 2023-08-21 DIAGNOSIS — I480 Paroxysmal atrial fibrillation: Secondary | ICD-10-CM

## 2023-08-21 MED ORDER — NEBIVOLOL HCL 2.5 MG PO TABS: 2.5000 mg | ORAL_TABLET | Freq: Two times a day (BID) | ORAL | 3 refills | Status: DC

## 2023-08-21 NOTE — Patient Instructions (Addendum)
Medication Instructions:  Increase Bystolic dose to twice daily, once in the am and once in the evening. New script sent to [facility] as requested.  *If you need a refill on your cardiac medications before your next appointment, please call your pharmacy*  Follow-Up: At [facility], you and your health needs are our priority.  As part of our continuing mission to provide you with exceptional heart care, we have created designated Provider Care Teams.  These Care Teams include your primary Cardiologist (physician) and Advanced Practice Providers (APPs -  Physician Assistants and Nurse Practitioners) who all work together to provide you with the care you need, when you need it.  We recommend signing up for the patient portal called "MyChart".  Sign up information is provided on this After Visit Summary.  MyChart is used to connect with patients for Virtual Visits (Telemedicine).  Patients are able to view lab/test results, encounter notes, upcoming appointments, etc.  Non-urgent messages can be sent to your provider as well.   To learn more about what you can do with MyChart, go to [email or web address].    Your next appointment:   5 month(s)  Provider:   [name], MD

## 2023-08-21 NOTE — Progress Notes (Signed)
Cardiology Office Note:   Date:  08/21/2023  ID:  Patricia Flynn, DOB 1945/11/07, MRN [id number] PCP: Patricia Ran, MD  [facility] Providers Cardiologist:  [name], MD {  History of Present Illness:   Patricia Flynn is a 77 y.o. female who was referred by Patricia Ran, MD for evaluation of palpitations.   She has no past history other than HTN and a negative perfusion study 2012.    I sent her for a CT which demonstrated no coronary calcium although there was mild aortic dilatation.  An echo was done and there were no significant abnormalities.  EF was low normal.  She had a monitor which demonstrated some paroxysmal atrial tachycardia but no atrial fibrillation.  SVT was nonsustained.   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    This has been stable on follow up.  She had palpitations and now has been found to have short bursts of atrial flutter with variable conduction on monitor.  She also has some coarse atrial fib.  We have spoken a couple times since then because she is having increased tachypalpitations.  Her heart rate goes up very fast for 5 or 6 minutes at a time.  I spoke to her and suggested that she increase her Bystolic this morning.  She takes her dose at night.  She did not get to do this because this morning she had increased palpitations.  She has taken a couple doses of Cardizem.    She said that she had fibrillation again this morning and she was able to capture this on the phone for me and I do see fibrillation.  It does not appear to be flutter.  She is saying these are lasting again for minutes but not hours.  She is not finding any trigger.  She is pretty much stopped drinking alcohol.  She has feels very poorly with them with her heart racing and is anxious about them.  She otherwise is active and has no symptoms of chest pressure, neck or arm discomfort.  She has had no weight gain or edema.  Of note she  previously had started taking this when she gets some p.o. prednisone but she stopped this on her own and she has not had any in 3 days.  She was due to get an epidural for sciatica.  ROS: As stated in the HPI and negative for all other systems.  Studies Reviewed:    EKG:   NA  Risk Assessment/Calculations:    CHA2DS2-VASc Score = 3   This indicates a 3.2% annual risk of stroke. The patient's score is based upon: CHF History: 0 HTN History: 0 Diabetes History: 0 Stroke History: 0 Vascular Disease History: 0 Age Score: 2 Gender Score: 1       Physical Exam:   VS:  BP 135/89 (BP Location: Right Arm, Patient Position: Sitting, Cuff Size: Normal)   Pulse 74   Ht 5\' 6"  (1.676 m)   Wt 138 lb (62.6 kg)   SpO2 97%   BMI 22.27 kg/m    Wt Readings from Last 3 Encounters:  08/21/23 138 lb (62.6 kg)  06/25/23 139 lb (63 kg)  04/07/23 141 lb 3.2 oz (64 kg)     GEN: Well nourished, well developed in no acute distress NECK: No JVD; No carotid bruits CARDIAC: Regular RR, no murmurs, rubs, gallops RESPIRATORY:  Clear to auscultation without rales, wheezing or rhonchi  ABDOMEN: Soft, non-tender, non-distended EXTREMITIES:  No edema; No deformity   ASSESSMENT AND PLAN:   Palpitations:   She has flutter and coarse atrial fibrillation.    She had evidence of this today on her phone.  She is on anticoagulation.   I am going to have her increase her Bystolic to bid for now and I have spoken with Dr. Elberta Fortis and he will see her to discuss therapy possibly to include ablation.  He is now happening somewhat without trigger and more frequency and intensity.  We are going to hold off on the epidural for now.  I do not want her to have interrupted anticoagulation.  Ascending aortic dilatation: This is 43 mm.  This will be followed up in May of next year.     Follow up with me in 3 months.   Signed, [name], MD

## 2023-08-24 NOTE — Progress Notes (Unsigned)
Electrophysiology Office Note:   Date:  08/26/2023  ID:  Patricia Flynn, DOB Nov 12, 1945, MRN [id number]  Primary Cardiologist: [name], MD Primary Heart Failure: None Electrophysiologist: Jedi Catalfamo Jorja Loa, MD      History of Present Illness:   Patricia Flynn is a 77 y.o. female with h/o aortic aneurysm, left-sided breast cancer, hyperlipidemia, hypertension seen today for  for Electrophysiology evaluation of AF/flutter at the request of Angelina Sheriff.    She presents with increasing palpitations.  Her heart rate goes fast for 5 to 6 minutes at a time.  She is currently on Bystolic.  She has palpitations for minutes at a time.  She has not found any trigger.  She has stopped drinking alcohol.  She feels quite poorly with anxiety and palpitations when these episodes occur.  She does not have chest pressure, neck or arm discomfort.  She is able to do her daily activities, but she does have daily episodes of palpitations.  Palpitations last from anywhere between a few seconds to 15 to 20 minutes.  While in the clinic, she had an episode of palpitations which was confirmed to be atrial fibrillation via her Apple Watch.  She does not have much shortness of breath or fatigue, though her palpitations are quite bothersome.  She thinks that stopping alcohol did somewhat improve her arrhythmia, but it has continued after requiring prednisone.  Review of systems complete and found to be negative unless listed in HPI.   EP Information / Studies Reviewed:    EKG is ordered today. Personal review as below.  EKG Interpretation Date/Time:  Tuesday August 26 2023 08:26:29 EST Ventricular Rate:  59 PR Interval:  138 QRS Duration:  86 QT Interval:  416 QTC Calculation: 411 R Axis:   -15  Text Interpretation: Sinus bradycardia When compared with ECG of 07-Apr-2019 22:59, No significant change since last tracing Confirmed by Felma Pfefferle ([id number]) on 08/26/2023 8:31:55 AM     Risk  Assessment/Calculations:    CHA2DS2-VASc Score = 3   This indicates a 3.2% annual risk of stroke. The patient's score is based upon: CHF History: 0 HTN History: 0 Diabetes History: 0 Stroke History: 0 Vascular Disease History: 0 Age Score: 2 Gender Score: 1            Physical Exam:   VS:  BP (!) 130/92 (BP Location: Right Arm, Patient Position: Sitting, Cuff Size: Normal)   Pulse (!) 59   Ht 5\' 6"  (1.676 m)   Wt 139 lb 12.8 oz (63.4 kg)   SpO2 95%   BMI 22.56 kg/m    Wt Readings from Last 3 Encounters:  08/26/23 139 lb 12.8 oz (63.4 kg)  08/21/23 138 lb (62.6 kg)  06/25/23 139 lb (63 kg)     GEN: Well nourished, well developed in no acute distress NECK: No JVD; No carotid bruits CARDIAC: Regular rate and rhythm, no murmurs, rubs, gallops RESPIRATORY:  Clear to auscultation without rales, wheezing or rhonchi  ABDOMEN: Soft, non-tender, non-distended EXTREMITIES:  No edema; No deformity   ASSESSMENT AND PLAN:    1.  Atrial flutter/fibrillation: Currently on Bystolic.  She feels quite poorly when she is in atrial fibrillation.  She has short episodes multiple times a day.  She would prefer a rhythm control strategy to watchful waiting.  Due to that, we Rayola Everhart plan for flecainide initiation.  Sabreena Vogan have her send an EKG from her primary physician in [place] in 2 weeks.  She continues to have episodes  of atrial fibrillation, could either increase the dose of her flecainide versus ablation.  2.  Secondary hypercoagulable state: Currently on Eliquis  3.  Ascending aortic aneurysm: Follow-up with primary cardiology.  Discussed with primary cardiology  Follow up with Dr. Elberta Fortis in 3 months  Signed, Vincentina Sollers Jorja Loa, MD

## 2023-08-26 ENCOUNTER — Ambulatory Visit: Payer: 59 | Attending: Cardiology | Admitting: Cardiology

## 2023-08-26 ENCOUNTER — Encounter: Payer: Self-pay | Admitting: Cardiology

## 2023-08-26 VITALS — BP 130/92 | HR 59 | Ht 66.0 in | Wt 139.8 lb

## 2023-08-26 DIAGNOSIS — I4892 Unspecified atrial flutter: Secondary | ICD-10-CM | POA: Diagnosis not present

## 2023-08-26 DIAGNOSIS — I480 Paroxysmal atrial fibrillation: Secondary | ICD-10-CM

## 2023-08-26 DIAGNOSIS — R002 Palpitations: Secondary | ICD-10-CM

## 2023-08-26 DIAGNOSIS — D6869 Other thrombophilia: Secondary | ICD-10-CM | POA: Diagnosis not present

## 2023-08-26 MED ORDER — FLECAINIDE ACETATE 50 MG PO TABS: 50.0000 mg | ORAL_TABLET | Freq: Two times a day (BID) | ORAL | 3 refills | Status: DC

## 2023-08-26 NOTE — Patient Instructions (Signed)
Medication Instructions:  Your physician has recommended you make the following change in your medication:  START Flecainide 50 mg twice daily  (Make sure to get an EKG at your doctor's office in [place].  They can fax it to [place] at [phone])  *If you need a refill on your cardiac medications before your next appointment, please call your pharmacy*   Lab Work: None ordered   Testing/Procedures: None ordered   Follow-Up: At [facility], you and your health needs are our priority.  As part of our continuing mission to provide you with exceptional heart care, we have created designated Provider Care Teams.  These Care Teams include your primary Cardiologist (physician) and Advanced Practice Providers (APPs -  Physician Assistants and Nurse Practitioners) who all work together to provide you with the care you need, when you need it.  Your next appointment:   3 month(s)  The format for your next appointment:   In Person  Provider:   Loman Brooklyn, MD    Thank you for choosing [facility]!!   Dory Horn, RN [phone]  Other Instructions  Flecainide Tablets What is this medication? FLECAINIDE (FLEK a nide) prevents and treats a fast or irregular heartbeat (arrhythmia). It is often used to treat a type of arrhythmia known as AFib (atrial fibrillation). It works by slowing down overactive electric signals in the heart, which stabilizes your heart rhythm. It belongs to a group of medications called antiarrhythmics. This medicine may be used for other purposes; ask your health care provider or pharmacist if you have questions. COMMON BRAND NAME(S): Tambocor What should I tell my care team before I take this medication? They need to know if you have any of these conditions: High or low levels of potassium in the blood Heart disease including heart rhythm and heart rate problems Kidney disease Liver disease Recent heart attack An unusual or allergic reaction to  flecainide, other medications, foods, dyes, or preservatives Pregnant or trying to get pregnant Breastfeeding How should I use this medication? Take this medication by mouth with a glass of water. Take it as directed on the prescription label at the same time every day. You can take it with or without food. If it upsets your stomach, take it with food. Do not take your medication more often than directed. Do not stop taking this medication suddenly. This may cause serious, heart-related side effects. If your care team wants you to stop the medication, the dose may be slowly lowered over time to avoid any side effects. Talk to your care team about the use of this medication in children. While it may be prescribed for children as young as 1 year for selected conditions, precautions do apply. Overdosage: If you think you have taken too much of this medicine contact a poison control center or emergency room at once. NOTE: This medicine is only for you. Do not share this medicine with others. What if I miss a dose? If you miss a dose, take it as soon as you can. If it is almost time for your next dose, take only that dose. Do not take double or extra doses. What may interact with this medication? Do not take this medication with any of the following: Amoxapine Arsenic trioxide Certain antibiotics, such as clarithromycin, erythromycin, gatifloxacin, gemifloxacin, levofloxacin, moxifloxacin, sparfloxacin, or troleandomycin Certain antidepressants, called tricyclic antidepressants such as amitriptyline, imipramine, or nortriptyline Certain medications for irregular heartbeat, such as disopyramide, encainide, moricizine, procainamide, propafenone, and quinidine Cisapride Delavirdine Droperidol  Haloperidol [place] Imatinib Levomethadyl Maprotiline Medications for malaria, such as chloroquine and halofantrine Pentamidine Phenothiazines, such as chlorpromazine, mesoridazine, prochlorperazine,  thioridazine Pimozide Quinine Ranolazine Ritonavir Sertindole This medication may also interact with the following: Cimetidine Dofetilide Medications for angina or blood pressure Medications for irregular heartbeat, such as amiodarone and digoxin Ziprasidone This list may not describe all possible interactions. Give your health care provider a list of all the medicines, herbs, non-prescription drugs, or dietary supplements you use. Also tell them if you smoke, drink alcohol, or use illegal drugs. Some items may interact with your medicine. What should I watch for while using this medication? Visit your care team for regular checks on your progress. Because your condition and the use of this medication carries some risk, it is a good idea to carry an identification card, necklace, or bracelet with details of your condition, medications, and care team. Check your blood pressure and pulse rate as directed. Know what your blood pressure and pulse rate should be and when tod contact your care team. Your care team may schedule regular blood tests and electrocardiograms to check your progress. This medication may affect your coordination, reaction time, or judgment. Do not drive or operate machinery until you know how this medication affects you. Sit up or stand slowly to reduce the risk of dizzy or fainting spells. Drinking alcohol with this medication can increase the risk of these side effects. What side effects may I notice from receiving this medication? Side effects that you should report to your care team as soon as possible: Allergic reactions--skin rash, itching, hives, swelling of the face, lips, tongue, or throat Heart failure--shortness of breath, swelling of the ankles, feet, or hands, sudden weight gain, unusual weakness or fatigue Heart rhythm changes--fast or irregular heartbeat, dizziness, feeling faint or lightheaded, chest pain, trouble breathing Liver injury--right upper belly  pain, loss of appetite, nausea, light-colored stool, dark yellow or brown urine, yellowing skin or eyes, unusual weakness or fatigue Side effects that usually do not require medical attention (report to your care team if they continue or are bothersome): Blurry vision Constipation Dizziness Fatigue Headache Nausea Tremors or shaking This list may not describe all possible side effects. Call your doctor for medical advice about side effects. You may report side effects to FDA at 1-800-FDA-1088. Where should I keep my medication? Keep out of the reach of children and pets. Store at room temperature between 15 and 30 degrees C (59 and 86 degrees F). Protect from light. Keep container tightly closed. Throw away any unused medication after the expiration date. NOTE: This sheet is a summary. It may not cover all possible information. If you have questions about this medicine, talk to your doctor, pharmacist, or health care provider.  2024 Elsevier/Gold Standard (2022-03-27 00:00:00)

## 2023-08-28 ENCOUNTER — Encounter: Payer: Self-pay | Admitting: Cardiology

## 2023-10-01 ENCOUNTER — Encounter: Payer: Self-pay | Admitting: Cardiology

## 2023-10-01 ENCOUNTER — Telehealth: Payer: Self-pay

## 2023-10-01 NOTE — Telephone Encounter (Signed)
Patient did return call. Verified name and DOB. She is currently in [place] and is not planning on returning until April. She states she had an episode if increased heart rate early this morning. She report hear rate between 85-105 according to her Apple Watch. She deny any other symptoms at that time. Patient also think she had an episode on Sunday as well. Advised patient if she continue to have increased heart rate and/or develop new symptoms she she be seen by someone in [place]. Suggested either urgent care or ED. Please advise if any other recommendations. See MyChart message from 1/29.

## 2023-10-01 NOTE — Telephone Encounter (Signed)
Pt aware Dr. Elberta Fortis says she can increase her Flecainide to 100 mg BID and have an EKG at local cardiologist in [facility] within next 10-14 days.   Patient does not want to do this at this time.  She is going to see how things go and let us know if need to readdress.  She appreciates the follow up and will call back if need to increase the medication.

## 2023-10-01 NOTE — Telephone Encounter (Signed)
Patient called back requesting to speak to Dr Elberta Fortis and not his assistant. She is calling concerning a MyChart message sent earlier. Patient was told message would be sent.

## 2023-10-01 NOTE — Telephone Encounter (Signed)
See today's telephone note.

## 2023-10-01 NOTE — Telephone Encounter (Signed)
Patient was returning call. Please advise ?

## 2023-10-01 NOTE — Telephone Encounter (Signed)
Spoke to pt.  See telephone note today

## 2023-10-01 NOTE — Telephone Encounter (Signed)
See telephone note today

## 2023-10-01 NOTE — Telephone Encounter (Signed)
Attempted to contact patient with no answer. Left message to call back.

## 2023-10-01 NOTE — Telephone Encounter (Signed)
Called pt to discuss/explain.  Made aware provider, Dr. Elberta Fortis is not back in the office until 2/10, but will send to him while in hospital today for review/advisement. (Dr. Elberta Fortis please review this whole note & mychart message) Episodes have been "2 hours or less". HRs are usually "58-60s".  Has jumped up to 105 during episodes. She did get Prednisone injection in December, for her back. She saw a cardiologist in [place] on 1/5 (to have someone in that area when she is there). Advised pt that I am not sure we would do anything at this time being HRs are still pretty much WNL and episodes occurring in short burst.  Aware he may want her to see/discuss w/ cardiologist there in [place] since she will not return to area until April/May vs continue monitoring for now. Forwarding to MD for advisement.  Pt understands I will let her know once he reviews.

## 2023-11-27 ENCOUNTER — Other Ambulatory Visit: Payer: Self-pay | Admitting: Cardiology

## 2023-11-27 DIAGNOSIS — I4892 Unspecified atrial flutter: Secondary | ICD-10-CM

## 2023-11-28 NOTE — Telephone Encounter (Signed)
 Pt last saw Dr Elberta Fortis 08/26/23, last labs 12/13/22 Creat 0.99, age 78, weight 63.4kg, based on specified criteria pt is on appropriate dosage of Eliquis 5mg  BID for afib.  Will refill rx.

## 2023-12-10 ENCOUNTER — Other Ambulatory Visit: Payer: Self-pay | Admitting: Internal Medicine

## 2023-12-24 ENCOUNTER — Other Ambulatory Visit: Payer: Self-pay | Admitting: Cardiology

## 2024-01-01 ENCOUNTER — Ambulatory Visit: Payer: 59 | Admitting: Cardiology

## 2024-01-01 ENCOUNTER — Ambulatory Visit: Admitting: Podiatry

## 2024-01-01 ENCOUNTER — Ambulatory Visit (INDEPENDENT_AMBULATORY_CARE_PROVIDER_SITE_OTHER)

## 2024-01-01 ENCOUNTER — Ambulatory Visit (INDEPENDENT_AMBULATORY_CARE_PROVIDER_SITE_OTHER): Payer: 59

## 2024-01-01 DIAGNOSIS — M7751 Other enthesopathy of right foot: Secondary | ICD-10-CM | POA: Diagnosis not present

## 2024-01-01 DIAGNOSIS — M21611 Bunion of right foot: Secondary | ICD-10-CM | POA: Diagnosis not present

## 2024-01-01 DIAGNOSIS — M25871 Other specified joint disorders, right ankle and foot: Secondary | ICD-10-CM | POA: Diagnosis not present

## 2024-01-01 DIAGNOSIS — M2011 Hallux valgus (acquired), right foot: Secondary | ICD-10-CM | POA: Diagnosis not present

## 2024-01-01 NOTE — Patient Instructions (Signed)
  VISIT SUMMARY: Today, you were seen for right foot pain due to a bunion. You have been experiencing severe pain under your right foot, especially when walking on hard surfaces without shoes. You have been using a gel pad which has provided some relief. You are also working with a physical therapist to improve your ankle stiffness.  YOUR PLAN: -HALLUX VALGUS WITH SESAMOIDITIS: Hallux valgus is a condition where the big toe points towards the other toes, creating a bump on the side of the foot, and sesamoiditis is inflammation of the small bones under the big toe joint. You have a moderate deformity and early inflammation, which is causing pain, especially when walking on hard surfaces without shoes. To manage this, you should avoid walking barefoot on hard surfaces and use cushioned slip-on house shoes like OOFOS. You can take acetaminophen  1000 mg as needed, up to every 6 hours, and apply Voltaren gel around the first MTP joint 3-4 times daily. Continue using the gel pad for extra cushioning and consider using a bunion shield sleeve for additional support. If your pain does not improve in 5-6 weeks, we may need to consider further evaluation for a walking boot or a cortisone injection.  INSTRUCTIONS: Follow up if your pain does not improve in 5-6 weeks for further evaluation, which may include the use of a walking boot or a cortisone injection.                      Contains text generated by [employer]bridge.                                 Contains text generated by Abridge.

## 2024-01-02 ENCOUNTER — Ambulatory Visit: Payer: 59 | Admitting: Cardiology

## 2024-01-02 NOTE — Progress Notes (Signed)
 Subjective:  Patient ID: Patricia Flynn, female    DOB: 09/27/1945,  MRN: [id number]  Chief Complaint  Patient presents with   Bunions    Right foot Bunion. 9 pain. Wears metatarsal pad. Had pain for 1 month. Non diabetic.    Discussed the use of AI scribe software for clinical note transcription with the patient, who gave verbal consent to proceed.  History of Present Illness Patricia Flynn is a 78 year old female who presents with right foot pain due to a bunion.  She has been experiencing severe pain underneath the right foot, specifically under the sesamoids, for about a month. The pain worsens when walking on hard floors or tile without shoes, such as in her bathroom, but is not present when walking on carpet or when wearing shoes. She has been using a gel pad for about three weeks, which has provided some relief.  She has been working with a physical therapist who noted stiffness in her ankles and recommended exercises to improve range of motion. She has been focusing on stretching her calves to help with this issue.  She cannot take anti-inflammatory medications like Aleve or Advil, but Tylenol  is permissible, although she has not been taking much of it. She has not found Tylenol  to be particularly helpful for her pain.  No pain on the side of the foot or when pressure is applied to certain areas, and no pain when the foot is moved up and down.      Objective:    Physical Exam VASCULAR: DP and PT pulse palpable. Foot is warm and well-perfused. Capillary fill time is brisk. DERMATOLOGIC: Normal skin turgor, texture, and temperature. No open lesions, rashes, or ulcerations. NEUROLOGIC: Normal sensation to light touch and pressure. No paresthesias. ORTHOPEDIC: Right foot hallux valgus deformity with hypermobility of the first ray. No pain on medial eminence. Good range of motion of MTP joint. Tenderness and callus under sesamoids on right foot. Smooth pain-free range of motion of  all other examined joints. No ecchymosis or bruising.     No images are attached to the encounter.    Results RADIOLOGY Right foot X-ray: Moderate hallux valgus deformity with increased intermetatarsal angle and hallux abductus angle. Small bone spur, dorsal first MTP joint. Joint space overall is maintained. (01/01/2024)   Assessment:   1. Sesamoiditis of right foot   2. Hallux valgus with bunions, right      Plan:  Patient was evaluated and treated and all questions answered.  Assessment and Plan Assessment & Plan Hallux valgus with sesamoiditis Moderate hallux valgus deformity with increased intermetatarsal and hallux abductus angles. Small bone spur at the dorsal first MTP joint with maintained joint space. Early sesamoiditis with tenderness and callus under the sesamoids on the right foot. Pain exacerbated by ambulation on hard surfaces without footwear, alleviated by shoes or gel pad. Loss of fat pad on the ball of the foot contributes to discomfort. She cannot take NSAIDs but can take acetaminophen . Voltaren gel is recommended for topical use. Avoiding barefoot ambulation on hard surfaces is advised to prevent further inflammation. - Recommend cushioned slip-on house shoes, such as OOFOS, to avoid barefoot ambulation on hard surfaces. - Instruct to take acetaminophen  1000 mg as needed, up to every 6 hours. - Apply Voltaren gel around the first MTP joint 3-4 times daily. - Continue using a gel pad for additional cushioning. - Consider a bunion shield sleeve for additional support. - If pain persists beyond 5-6 weeks, consider  further evaluation for potential use of a walking boot or joint injection with cortisone.      Return if symptoms worsen or fail to improve.

## 2024-01-08 ENCOUNTER — Ambulatory Visit: Payer: 59 | Admitting: Cardiology

## 2024-01-12 ENCOUNTER — Ambulatory Visit (HOSPITAL_BASED_OUTPATIENT_CLINIC_OR_DEPARTMENT_OTHER): Payer: 59 | Admitting: Obstetrics & Gynecology

## 2024-01-12 ENCOUNTER — Encounter (HOSPITAL_BASED_OUTPATIENT_CLINIC_OR_DEPARTMENT_OTHER): Payer: Self-pay | Admitting: Obstetrics & Gynecology

## 2024-01-12 VITALS — BP 135/85 | HR 56 | Ht 66.0 in | Wt 141.0 lb

## 2024-01-12 DIAGNOSIS — Z01419 Encounter for gynecological examination (general) (routine) without abnormal findings: Secondary | ICD-10-CM | POA: Diagnosis not present

## 2024-01-12 DIAGNOSIS — I483 Typical atrial flutter: Secondary | ICD-10-CM | POA: Diagnosis not present

## 2024-01-12 DIAGNOSIS — Z853 Personal history of malignant neoplasm of breast: Secondary | ICD-10-CM | POA: Diagnosis not present

## 2024-01-12 DIAGNOSIS — Z9012 Acquired absence of left breast and nipple: Secondary | ICD-10-CM | POA: Diagnosis not present

## 2024-01-12 DIAGNOSIS — N952 Postmenopausal atrophic vaginitis: Secondary | ICD-10-CM

## 2024-01-12 MED ORDER — ESTRADIOL 10 MCG VA TABS
1.0000 | ORAL_TABLET | VAGINAL | 3 refills | Status: AC
Start: 2024-01-12 — End: ?

## 2024-01-12 MED ORDER — PREMARIN 0.625 MG/GM VA CREA: TOPICAL_CREAM | VAGINAL | 1 refills | Status: AC

## 2024-01-12 NOTE — Progress Notes (Unsigned)
 Breast and Pelvic Exam Patient name: Patricia Flynn MRN [id number]  Date of birth: [birth year] Chief Complaint:   Breast and Pelvic  History of Present Illness:   Patricia Flynn is a 78 y.o. G3P3 Caucasian female being seen today for a routine annual exam.  H/o atrial flutter that has developed this past year.  Typically goes out on her own.  Now on flecanide and nebivolol .  She is wondering about cardiac ablation.  Will see Dr. Sheran Diesel tomorrow.    Denies vaginal bleeding.  Needs RF for vaginal estradiol  tablet.  Requests refill.  No LMP recorded. Patient is postmenopausal.   Last pap 05/17/2016 . Results were: negative.  Last mammogram: 06/03/2023 . Results were: normal.  Last colonoscopy: 11/17/2014  . Results were: normal.  Dexa:  scheduled in June     01/12/2024    1:42 PM 07/30/2022   11:06 AM 12/26/2020   11:02 AM  Depression screen PHQ 2/9  Decreased Interest 0 0 0  Down, Depressed, Hopeless 0 0 0  PHQ - 2 Score 0 0 0     Review of Systems:   Pertinent items are noted in HPI Denies any urinary changes changes or bowel changes Pertinent History Reviewed:  Reviewed past medical,surgical, social and family history.  Reviewed problem list, medications and allergies. Physical Assessment:   Vitals:   01/12/24 1336  BP: 135/85  Pulse: (!) 56  Weight: 141 lb (64 kg)  Height: 5\' 6"  (1.676 m)  Body mass index is 22.76 kg/m.        Physical Examination:   General appearance - well appearing, and in no distress  Mental status - alert, oriented to person, place, and time  Psych:  She has a normal mood and affect  Skin - warm and dry, normal color, no suspicious lesions noted  Chest - effort normal, all lung fields clear to auscultation bilaterally  Heart - normal rate and regular rhythm  Neck:  midline trachea, no thyromegaly or nodules  Breasts - breasts appear normal, no suspicious masses, no skin or nipple changes or axillary nodes, well healed scars on  left  Abdomen - soft, nontender, nondistended, no masses or organomegaly  Pelvic - VULVA: normal appearing vulva with no masses, tenderness or lesions   VAGINA: atrophic mucosa with normal color and discharge, no lesions   CERVIX: normal appearing cervix without discharge or lesions, no CMT  Thin prep pap not indicated  UTERUS: uterus is felt to be normal size, shape, consistency and nontender   ADNEXA: No adnexal masses or tenderness noted.  Rectal - normal rectal, good sphincter tone, no masses felt.  Extremities:  No swelling or varicosities noted  Chaperone present for exam  Assessment & Plan:  1. Encntr for gyn exam (general) (routine) w/o abn findings (Primary) - Pap smear 05/2016 neg - Mammogram 06/03/2023 - Colonoscopy 11/17/2014 - Bone mineral density scheduled for June - lab work done with PCP, Dr. Genelle Kennedy - vaccines reviewed/updated  2. History of breast cancer  3. Typical atrial flutter (HCC) - seeing Dr. Lawana Pray  4. S/P left mastectomy  5. Postmenopausal atrophic vaginitis - Estradiol  10 MCG TABS vaginal tablet; Place 1 tablet (10 mcg total) vaginally 2 (two) times a week.  Dispense: 24 tablet; Refill: 3 - conjugated estrogens  (PREMARIN ) vaginal cream; Use pea sized amount externally twice weekly  Dispense: 30 g; Refill: 1    Meds:  Meds ordered this encounter  Medications   Estradiol  10 MCG TABS vaginal tablet  Sig: Place 1 tablet (10 mcg total) vaginally 2 (two) times a week.    Dispense:  24 tablet    Refill:  3   conjugated estrogens  (PREMARIN ) vaginal cream    Sig: Use pea sized amount externally twice weekly    Dispense:  30 g    Refill:  1    Please file rx and she will call when needs RF.    Follow-up: No follow-ups on file.  Lillian Rein, MD 01/15/2024 11:27 PM

## 2024-01-13 ENCOUNTER — Telehealth: Payer: Self-pay

## 2024-01-13 ENCOUNTER — Ambulatory Visit: Payer: 59 | Attending: Cardiology | Admitting: Cardiology

## 2024-01-13 ENCOUNTER — Encounter: Payer: Self-pay | Admitting: Cardiology

## 2024-01-13 VITALS — BP 114/80 | HR 57 | Ht 66.0 in | Wt 138.0 lb

## 2024-01-13 DIAGNOSIS — I480 Paroxysmal atrial fibrillation: Secondary | ICD-10-CM

## 2024-01-13 DIAGNOSIS — D6869 Other thrombophilia: Secondary | ICD-10-CM

## 2024-01-13 DIAGNOSIS — R002 Palpitations: Secondary | ICD-10-CM | POA: Diagnosis not present

## 2024-01-13 DIAGNOSIS — Z79899 Other long term (current) drug therapy: Secondary | ICD-10-CM

## 2024-01-13 DIAGNOSIS — I4892 Unspecified atrial flutter: Secondary | ICD-10-CM

## 2024-01-13 NOTE — Patient Instructions (Signed)
   Testing/Procedures:  A FIB ABLATION-AUGUST  Follow-Up: At [facility], you and your health needs are our priority.  As part of our continuing mission to provide you with exceptional heart care, our providers are all part of one team.  This team includes your primary Cardiologist (physician) and Advanced Practice Providers or APPs (Physician Assistants and Nurse Practitioners) who all work together to provide you with the care you need, when you need it.  Your next appointment:   TO BE DETERMINED

## 2024-01-13 NOTE — Progress Notes (Signed)
 Electrophysiology Office Note:   Date:  01/13/2024  ID:  Patricia Flynn, DOB Mar 17, 1946, MRN [id number]  Primary Cardiologist: Eilleen Grates, MD Primary Heart Failure: None Electrophysiologist: Tanner Vigna Cortland Ding, MD      History of Present Illness:   Patricia Flynn is a 78 y.o. female with h/o aortic aneurysm, left-sided breast cancer, hyperlipidemia, hypertension, atrial fibrillation/flutter seen today for routine electrophysiology followup.   Since last being seen in our clinic the patient reports doing well.  She has had no further episodes of atrial fibrillation.  She has not noted palpitations, fatigue, shortness of breath.  She continues to do all of her daily activities.  Despite this, she would like to avoid long-term antiarrhythmics if possible.  she denies chest pain, palpitations, dyspnea, PND, orthopnea, nausea, vomiting, dizziness, syncope, edema, weight gain, or early satiety.   Review of systems complete and found to be negative unless listed in HPI.   EP Information / Studies Reviewed:    EKG is ordered today. Personal review as below.  EKG Interpretation Date/Time:  Tuesday Jan 13 2024 09:42:13 EDT Ventricular Rate:  57 PR Interval:  196 QRS Duration:  90 QT Interval:  452 QTC Calculation: 439 R Axis:   -43  Text Interpretation: Sinus bradycardia Left axis deviation When compared with ECG of 26-Aug-2023 08:26, No significant change was found Confirmed by Bain Whichard ([id number]) on 01/13/2024 9:47:38 AM     Risk Assessment/Calculations:    CHA2DS2-VASc Score = 3   This indicates a 3.2% annual risk of stroke. The patient's score is based upon: CHF History: 0 HTN History: 0 Diabetes History: 0 Stroke History: 0 Vascular Disease History: 0 Age Score: 2 Gender Score: 1             Physical Exam:   VS:  BP 114/80 (BP Location: Right Arm, Patient Position: Sitting, Cuff Size: Normal)   Pulse (!) 57   Ht 5\' 6"  (1.676 m)   Wt 138 lb (62.6 kg)   SpO2 97%    BMI 22.27 kg/m    Wt Readings from Last 3 Encounters:  01/13/24 138 lb (62.6 kg)  01/12/24 141 lb (64 kg)  08/26/23 139 lb 12.8 oz (63.4 kg)     GEN: Well nourished, well developed in no acute distress NECK: No JVD; No carotid bruits CARDIAC: Regular rate and rhythm, no murmurs, rubs, gallops RESPIRATORY:  Clear to auscultation without rales, wheezing or rhonchi  ABDOMEN: Soft, non-tender, non-distended EXTREMITIES:  No edema; No deformity   ASSESSMENT AND PLAN:    1.  Paroxysmal atrial fibrillation/flutter: On the Bystolic  and flecainide .  She has not had any atrial fibrillation on her current medications, though she would prefer to avoid long-term antiarrhythmics.  Due to that, we Charlet Harr plan for ablation.  Risks and benefits of discussed.  She understands risks and is agreed to the procedure.  Risk, benefits, and alternatives to EP study and radiofrequency/pulse field ablation for afib were also discussed in detail today. These risks include but are not limited to stroke, bleeding, vascular damage, tamponade, perforation, damage to the esophagus, lungs, and other structures, pulmonary vein stenosis, worsening renal function, and death. The patient understands these risk and wishes to proceed.  We Jahmarion Popoff therefore proceed with catheter ablation at the next available time.  Carto, ICE, anesthesia are requested for the procedure.  Javante Nilsson also obtain CT PV protocol prior to the procedure to exclude LAA thrombus and further evaluate atrial anatomy.  2.  Secondary hypercoagulable state: On  Eliquis   3.  Ascending aortic aneurysm: Follows with primary cardiology  4.  High risk medication monitoring: Intervals remained stable on flecainide   Follow up with Afib Clinic as usual post procedure  Signed, Yacoub Diltz Cortland Ding, MD

## 2024-01-13 NOTE — Telephone Encounter (Signed)
 Pt is scheduled for Afib Ablation with Dr. Reno Cash on 8/20 at 1:30 PM.   She will be out of the [facility] 7/20 - 8/4...  She will have labs done on 8/5  I will email instruction letters to pt once completed per her request.

## 2024-01-15 ENCOUNTER — Encounter (HOSPITAL_BASED_OUTPATIENT_CLINIC_OR_DEPARTMENT_OTHER): Payer: Self-pay | Admitting: Obstetrics & Gynecology

## 2024-01-20 DIAGNOSIS — I480 Paroxysmal atrial fibrillation: Secondary | ICD-10-CM | POA: Insufficient documentation

## 2024-01-20 NOTE — Progress Notes (Signed)
 Cardiology Office Note:   Date:  01/22/2024  ID:  Patricia Flynn, DOB Dec 07, 1945, MRN [id number] PCP: Aldo Hun, MD  [facility] Providers Cardiologist:  Eilleen Grates, MD Electrophysiologist:  Will Cortland Ding, MD {  History of Present Illness:   Patricia Flynn is a 78 y.o. female who was referred by Aldo Hun, MD for evaluation of palpitations.   She has no past history other than HTN and a negative perfusion study 2012.    I sent her for a CT which demonstrated no coronary calcium although there was mild aortic dilatation.  An echo was done and there were no significant abnormalities.  EF was low normal.  She had a monitor which demonstrated some paroxysmal atrial tachycardia but no atrial fibrillation.  SVT was nonsustained.   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    This has been stable on follow up.  She had palpitations and now has been found to have short bursts of atrial flutter with variable conduction on monitor.  She also has some coarse atrial fib.  She saw Dr. Lawana Pray and is scheduled for an ablation.  She came back today to talk about this.  She has not been having any of the fibrillation on the flecainide  but she is still interested in the ablation.  She has a lot of trouble taking antibiotics because of the flecainide  and would want to come off of this.  She also would want to consider coming off of anticoagulation in the future if possible because she is having a lot of trouble with sciatica and cannot take nonsteroidals.  She has had problems with exercise because of the sciatica.  She has had fatigue because of disturbed sleep.  ROS: Positive for fatigue, decreased sleep, sciatica, recurrent UTIs. Otherwise as stated in the HPI and negative for all other systems.  Studies Reviewed:    EKG:   Sinus rhythm, rate 57, left axis deviation, RSR prime V1, no acute ST-T wave changes.   Risk  Assessment/Calculations:    CHA2DS2-VASc Score = 3   This indicates a 3.2% annual risk of stroke. The patient's score is based upon: CHF History: 0 HTN History: 0 Diabetes History: 0 Stroke History: 0 Vascular Disease History: 0 Age Score: 2 Gender Score: 1     Physical Exam:   VS:  BP 138/88   Pulse 60   Ht 5' 5.5" (1.664 m)   Wt 136 lb (61.7 kg)   SpO2 96%   BMI 22.29 kg/m    Wt Readings from Last 3 Encounters:  01/22/24 136 lb (61.7 kg)  01/13/24 138 lb (62.6 kg)  01/12/24 141 lb (64 kg)     GEN: Well nourished, well developed in no acute distress NECK: No JVD; No carotid bruits CARDIAC: RRR, no murmurs, rubs, gallops RESPIRATORY:  Clear to auscultation without rales, wheezing or rhonchi  ABDOMEN: Soft, non-tender, non-distended EXTREMITIES:  No edema; No deformity   ASSESSMENT AND PLAN:   PAF:  She is having ablation in August.  We talked about this again at length today.  She had questions about proceeding with it and I do think it is reasonable particularly given the fact that she not infrequently needs antibiotics for UTIs.  She might even be able to come off of the anticoagulant at some point so she can get nonsteroidals.  For now she will continue the meds as listed.  Ascending aortic  dilatation:   She will follow up with CT of her aorta in August.  She will have this prior to her ablation.  Sciatica: I am going to refer her to sports medicine.  She is already seen a Careers adviser.    Follow up with me in six months.   Signed, Eilleen Grates, MD

## 2024-01-22 ENCOUNTER — Encounter: Payer: Self-pay | Admitting: Cardiology

## 2024-01-22 ENCOUNTER — Ambulatory Visit: Payer: 59 | Attending: Cardiology | Admitting: Cardiology

## 2024-01-22 ENCOUNTER — Ambulatory Visit: Payer: 59 | Admitting: Cardiology

## 2024-01-22 VITALS — BP 138/88 | HR 60 | Ht 65.5 in | Wt 136.0 lb

## 2024-01-22 DIAGNOSIS — I77810 Thoracic aortic ectasia: Secondary | ICD-10-CM

## 2024-01-22 DIAGNOSIS — I480 Paroxysmal atrial fibrillation: Secondary | ICD-10-CM

## 2024-01-22 MED ORDER — NITROFURANTOIN MONOHYD MACRO 100 MG PO CAPS: 100.0000 mg | ORAL_CAPSULE | Freq: Two times a day (BID) | ORAL | 0 refills | Status: DC

## 2024-01-22 NOTE — Patient Instructions (Addendum)
 Medication Instructions:  Your physician has recommended you make the following change in your medication:   May take Keflex 500 mg twice daily  *If you need a refill on your cardiac medications before your next appointment, please call your pharmacy*  Lab Work: Labs for ablation are due by 04/07/24, no appointment needed. If you have labs (blood work) drawn today and your tests are completely normal, you will receive your results only by: MyChart Message (if you have MyChart) OR A paper copy in the mail If you have any lab test that is abnormal or we need to change your treatment, we will call you to review the results.  Follow-Up: At [facility], you and your health needs are our priority.  As part of our continuing mission to provide you with exceptional heart care, our providers are all part of one team.  This team includes your primary Cardiologist (physician) and Advanced Practice Providers or APPs (Physician Assistants and Nurse Practitioners) who all work together to provide you with the care you need, when you need it.  Your next appointment:   6 month(s)  Provider:   Eilleen Grates, MD  We recommend signing up for the patient portal called "MyChart".  Sign up information is provided on this After Visit Summary.  MyChart is used to connect with patients for Virtual Visits (Telemedicine).  Patients are able to view lab/test results, encounter notes, upcoming appointments, etc.  Non-urgent messages can be sent to your provider as well.   To learn more about what you can do with MyChart, go to [email or web address].

## 2024-02-16 ENCOUNTER — Other Ambulatory Visit: Payer: Self-pay | Admitting: Internal Medicine

## 2024-02-17 ENCOUNTER — Ambulatory Visit: Admitting: Sports Medicine

## 2024-02-17 ENCOUNTER — Other Ambulatory Visit: Payer: Self-pay

## 2024-02-17 ENCOUNTER — Encounter: Payer: Self-pay | Admitting: Sports Medicine

## 2024-02-17 VITALS — BP 131/97 | Ht 66.0 in | Wt 136.0 lb

## 2024-02-17 DIAGNOSIS — M23204 Derangement of unspecified medial meniscus due to old tear or injury, left knee: Secondary | ICD-10-CM | POA: Diagnosis not present

## 2024-02-17 DIAGNOSIS — S46219A Strain of muscle, fascia and tendon of other parts of biceps, unspecified arm, initial encounter: Secondary | ICD-10-CM | POA: Diagnosis not present

## 2024-02-17 DIAGNOSIS — M25562 Pain in left knee: Secondary | ICD-10-CM | POA: Diagnosis not present

## 2024-02-17 DIAGNOSIS — M25511 Pain in right shoulder: Secondary | ICD-10-CM | POA: Diagnosis not present

## 2024-02-17 DIAGNOSIS — M5416 Radiculopathy, lumbar region: Secondary | ICD-10-CM | POA: Insufficient documentation

## 2024-02-17 NOTE — Assessment & Plan Note (Signed)
-   The patient's history, physical, and ultrasound findings are consistent with osteoarthritis and degenerative tears of the medial and lateral meniscus.  She does have a moderate effusion but her range of motion is intact. - For now she will continue conservative management with a compression sleeve, ice, Voltaren gel, and activity modification to include avoidance of deep squats. - Quadricep strengthening exercises were given with VMO focus. - We will follow-up in 4 weeks.  If there is no improvement at that time or she has worsening pain we can consider steroid injection but I would defer at this point.

## 2024-02-17 NOTE — Progress Notes (Signed)
 Patricia Flynn - 78 y.o. female MRN [id number]  Date of birth: 1945/09/11  PCP: Aldo Hun, MD  Subjective:  No chief complaint on file.  Right shoulder, lower back, and left knee pain  Note she was referred to me by Dr. Lavonne Prairie who manages her AFIB  HPI: Past Medical, Surgical, Social, and Family History Reviewed & Updated per EMR.   Patient is a 78 y.o. female here for pain in her right shoulder that started recently without injury. She exercises frequently and incorporates resistance training and has had some pain with abduction and overhead movement. She denies any numbness or weakness. She has a long hx of lumbar scoliosis with right sided pain and sciatic nerve pain down the back of the leg that she has been seen for by neurosurgery and received 3 epidural injections for previously in [place]. An MRI showed foraminal narrowing and some facet arthritis with nerve impingement. The third of these gave her 3 days of complete relief of her pain. PT has been ordered for her back but she wanted to wait until being seen here to start it. The pain has improved and has been manageable.  She also had left knee pain develop after standing quickly from a seated position recently. She felt immediate pain and swelling. The knee has not had mechanical locking and the swelling has somewhat improved but continues to hurt and impede her normal activities. She denies any numbness, weakness, redness in the joints, fevers, chills, unexplained weight loss or loss of bowel or bladder.   Pertinent PMHx: Hx of lumbar scoliosis  Past Surgical History:  Procedure Laterality Date   CHOLECYSTECTOMY  09/15/2012   Procedure: LAPAROSCOPIC CHOLECYSTECTOMY WITH INTRAOPERATIVE CHOLANGIOGRAM;  Surgeon: Darcella Earnest, MD;  Location: [facility] OR;  Service: General;  Laterality: N/A;   COLONOSCOPY     MASTECTOMY  1995   left, followed by reconstruction    UPPER GASTROINTESTINAL ENDOSCOPY      Allergies  Allergen Reactions    Prednisone Other (See Comments)    Elevated heart rate        Objective:  Physical Exam: VS: BP:(!) 131/97  HR: bpm  TEMP: ( )  RESP:   HT:5' 6 (167.6 cm)   WT:136 lb (61.7 kg)  BMI:21.96  Gen: Well developed, NAD, speaks clearly, comfortable in exam room Respiratory: Normal work of breathing on room air, no respiratory distress Skin: No rashes, abrasions, or ecchymosis MSK: Right Shoulder: Inspection there is good scapular control with overhead movement. There appears to be a mild defect in the proximal biceps.  ROM: full in all directions Strength: 5/5 in all directions AC joint: NT Special tests: Hawkins, speeds, and yergasons are positive Empty can equivocal Obrien's, neers, crossover negative  Lower back:  There is some left lateral scoliosis and obvious spasm of the right lumbar musculature TTP w/ spasticity of the right quadratus lumborum  No TTP over the spinous processes  Left Knee: Inspection: moderate effusion ROM: 0-120 degrees Strength: 5/5 in flexion and extension Palpation w/ no warmth or posterior tenderness but some fullness appreciated Medial>lateral joint line tenderness to palpation  no patellar tenderness, translation NT No condyle tenderness. Special Tests:  Varus/valgus stress test negative, lachman's negative Neuro: NVID  Ultrasound of Right Shoulder:  Biceps Tendon SAX and LAX: visualized in bicipital groove w/ hypoechoic changes surrounding and within a defect in the medial tendon fibers Subscapularis tendon - viewed in and LAX inserting into the inferior lesser tubercle of humerus. Hypoechoic fluid in  the inferior most aspect of the tendon AC joint - mildly narrowed, w/ osteophyte formation, and positive geyser sign Supraspinatus tendon - viewed in SAX and LAX, tendon in tact, insertion at superior facet of greater tubercle of the humerus w/ several small linear hypoechoic defects within the tendon but overall fibers attached   Infraspinatus and Teres minor tendons - viewed in LAX and SAX at the middle facet of the greater tubercle of humerus w/ echogenics: no hypo/hyperechoic changes present Posterior glenohumeral joint visualized: no effusion present  Summary: Partial thickness, medial sided tear of the proximal long head of the biceps tendon. Tendonitis and partial tears of the subscapularis and supraspinatus tendons.   Ultrasound and interpretation by Dr. Dannis Dy and Dr. Nolene Baumgarten  Ultrasound of the Left knee Moderate effusion in the suprapatellar pouch Patellar and quadriceps tendons are normal Lateral meniscus has hypoechoic defects with some extrusion and surrounding hypoechoic fluid with narrowing and osteophyte formation along the lateral joint line Medial meniscus also has hypoechoic defects with some extrusion and surrounding hypoechoic fluid with narrowing and osteophyte formation along the lateral joint line  Summary: Medial and lateral degenerative meniscus tears with a moderate effusion  Ultrasound and interpretation by Dr. Dannis Dy and Dr. Nolene Baumgarten    Assessment & Plan:   Tear of biceps tendon - The patient's history, exam, and ultrasound findings are consistent with partial tear of the proximal head of the right biceps tendon.  There is small partial tearing to a lesser extent of the subscapularis and supraspinatus. - Her strength and range of motion is intact. - Will have her start home biceps tendon and rotator cuff rehab exercises.  She can continue ice and Voltaren gel over the area. - We discussed modification of activities including lifting to maximize healing.  Chronic right-sided lumbar radiculopathy - The patient has had some chronic but improved right sided sciatic pain that is due to lumbar etiology.  She does have some scoliosis and compensatory pain as well as spasm of the quadratus lumborum on exam. - Her outside MRI was reviewed which shows multilevel degenerative facet changes as well as  some foraminal narrowing at the L4-L5 level.  There is also some narrowing at the L5-S1 level.  These are consistent with her symptom pattern. - She did get 3 days of complete relief from a prior epidural injection which supports lumbar etiology. - She has already seen neurosurgeon specialist in town and has an order for physical therapy.  We will see her back as soon as she is able to schedule a visit for shockwave therapy of the quadratus lumborum.  Once this has settled down she can start her physical therapy which I believe will be very beneficial for her. - Overall she states that her pain is largely controlled but we can pursue other pain management options in the future if needed.  Degenerative tear of medial meniscus of left knee - The patient's history, physical, and ultrasound findings are consistent with osteoarthritis and degenerative tears of the medial and lateral meniscus.  She does have a moderate effusion but her range of motion is intact. - For now she will continue conservative management with a compression sleeve, ice, Voltaren gel, and activity modification to include avoidance of deep squats. - Quadricep strengthening exercises were given with VMO focus. - We will follow-up in 4 weeks.  If there is no improvement at that time or she has worsening pain we can consider steroid injection but I would defer at this point.  Berneda Bridges MD [facility] Sports Medicine Fellow

## 2024-02-17 NOTE — Assessment & Plan Note (Signed)
-   The patient's history, exam, and ultrasound findings are consistent with partial tear of the proximal head of the right biceps tendon.  There is small partial tearing to a lesser extent of the subscapularis and supraspinatus. - Her strength and range of motion is intact. - Will have her start home biceps tendon and rotator cuff rehab exercises.  She can continue ice and Voltaren gel over the area. - We discussed modification of activities including lifting to maximize healing.

## 2024-02-17 NOTE — Assessment & Plan Note (Signed)
-   The patient has had some chronic but improved right sided sciatic pain that is due to lumbar etiology.  She does have some scoliosis and compensatory pain as well as spasm of the quadratus lumborum on exam. - Her outside MRI was reviewed which shows multilevel degenerative facet changes as well as some foraminal narrowing at the L4-L5 level.  There is also some narrowing at the L5-S1 level.  These are consistent with her symptom pattern. - She did get 3 days of complete relief from a prior epidural injection which supports lumbar etiology. - She has already seen neurosurgeon specialist in town and has an order for physical therapy.  We will see her back as soon as she is able to schedule a visit for shockwave therapy of the quadratus lumborum.  Once this has settled down she can start her physical therapy which I believe will be very beneficial for her. - Overall she states that her pain is largely controlled but we can pursue other pain management options in the future if needed.

## 2024-03-02 ENCOUNTER — Ambulatory Visit (INDEPENDENT_AMBULATORY_CARE_PROVIDER_SITE_OTHER): Admitting: Sports Medicine

## 2024-03-02 VITALS — Ht 66.0 in

## 2024-03-02 DIAGNOSIS — M5441 Lumbago with sciatica, right side: Secondary | ICD-10-CM

## 2024-03-02 DIAGNOSIS — M545 Low back pain, unspecified: Secondary | ICD-10-CM | POA: Insufficient documentation

## 2024-03-02 DIAGNOSIS — G8929 Other chronic pain: Secondary | ICD-10-CM

## 2024-03-02 NOTE — Assessment & Plan Note (Signed)
 Trial of ESWT  Plan 6 sessions and reevaluate

## 2024-03-02 NOTE — Progress Notes (Signed)
 ESWT # 1  Patient here for a trial of ESWT Chronic pain over RT QL for 3 years Heat sometimes helps  Site: RT Quadratus Lumborum Head size: large Impulses: 2000 Power: 60 mJ Frequency 10  Patient tolerated procedure well.  Felt looser at completion. She will do a trial of 6 sessions.

## 2024-03-16 ENCOUNTER — Ambulatory Visit (INDEPENDENT_AMBULATORY_CARE_PROVIDER_SITE_OTHER): Payer: Self-pay | Admitting: Sports Medicine

## 2024-03-16 ENCOUNTER — Telehealth: Payer: Self-pay | Admitting: Cardiology

## 2024-03-16 ENCOUNTER — Ambulatory Visit (INDEPENDENT_AMBULATORY_CARE_PROVIDER_SITE_OTHER): Admitting: Sports Medicine

## 2024-03-16 VITALS — BP 120/78 | Ht 66.0 in | Wt 138.0 lb

## 2024-03-16 DIAGNOSIS — S46219A Strain of muscle, fascia and tendon of other parts of biceps, unspecified arm, initial encounter: Secondary | ICD-10-CM | POA: Diagnosis not present

## 2024-03-16 DIAGNOSIS — Z01812 Encounter for preprocedural laboratory examination: Secondary | ICD-10-CM

## 2024-03-16 DIAGNOSIS — I480 Paroxysmal atrial fibrillation: Secondary | ICD-10-CM

## 2024-03-16 DIAGNOSIS — M545 Low back pain, unspecified: Secondary | ICD-10-CM

## 2024-03-16 DIAGNOSIS — G8929 Other chronic pain: Secondary | ICD-10-CM

## 2024-03-16 NOTE — Telephone Encounter (Signed)
 Pt requesting cb to discuss labs prior to ablation

## 2024-03-16 NOTE — Progress Notes (Signed)
 ESWT # 2   Patient here for a trial of ESWT Chronic pain over RT QL for 3 years Heat sometimes helps   Site: RT Quadratus Lumborum Head size: large Impulses: 2500 Power: 90 mJ Frequency 14   Patient tolerated procedure well.    She will do a trial of 6 sessions.

## 2024-03-16 NOTE — Progress Notes (Addendum)
  Patricia Flynn - 78 y.o. female MRN [id number]  Date of birth: 1945-10-02  SUBJECTIVE:  Including CC & ROS.  No chief complaint on file. Patient is presenting for follow-up of her partial tear of the proximal head of the right biceps tendon, as well as degenerative tear of the medial meniscus of her left knee.  Patient states that she has been doing home exercises for both biceps and rotator cuff as well as quadricep strengthening with VMO focus.  Patient states overall she feels stronger both with her right biceps as well as her left knee.  Patient is planning on going out of the country to [place] in [place] and doing a lot of walking.  She is unsure if she wants a steroid injection before her trip.  Patient only feels pain with her right biceps when lifting laundry.  PHYSICAL EXAM:  VS: BP:120/78  HR: bpm  TEMP: ( )  RESP:   HT:5' 6 (167.6 cm)   WT:138 lb (62.6 kg)  BMI:22.28 PHYSICAL EXAM:  Right shoulder and biceps Appearance: No effusion, erythema or edema Palpation: No AC joint tenderness, no bicipital groove tenderness, no humeral head tenderness ROM: Full in both shoulder flexion and abduction Strength: 5/5 bilaterally with abduction, internal and external rotation, and elbow flexion Special test: Hawking's negative, crossover negative, speeds negative, Yergason's negative, empty can negative, drop arm negative Neuro: Sensation equal bilaterally  Left knee Appearance: Mild effusion, no erythema or edema Palpation: Medial joint line tender to palpation greater than lateral ROM: Full 0 to 120 degrees Strength: 5/5 bilaterally with extension and flexion Special test: Valgus and varus stress negative, Lachman's negative, McMurray's negative Neuro: Sensation equal bilaterally  ASSESSMENT & PLAN: See problem based charting & AVS for pt instructions. Patient has continued to rehab both her right biceps tendon as well as left medial meniscus.  She is improving and her strength has  improved.  We discussed the option of giving her a steroid injection into her left knee to which she declined.  She would like to go on her trip without any steroid.  If she is having problems, she will return to visit with us  for steroid injection.  Discussed continuing doing therapy and exercises while on her trip.

## 2024-03-16 NOTE — Telephone Encounter (Signed)
 Patient states she will be out of the country until August 4th. She will not be able to have labs drawn until August 5th.   Informed patient she must have labs drawn this day as her CT scan is August 6th. CBC, BMET reordered as STAT and released to [facility]. Patient states she will get her labs drawn this day and expressed appreciation for follow-up.

## 2024-03-16 NOTE — Assessment & Plan Note (Signed)
 Improving on a home exercise program  We will continue this  However on testing she is doing well so we will allow more activity

## 2024-03-16 NOTE — Assessment & Plan Note (Signed)
 See planned treatments with ESWT

## 2024-03-24 ENCOUNTER — Telehealth: Payer: Self-pay

## 2024-03-24 NOTE — Telephone Encounter (Signed)
 CT/Ablation letters emailed to pt. Also sent via MyChart.

## 2024-04-06 ENCOUNTER — Ambulatory Visit (INDEPENDENT_AMBULATORY_CARE_PROVIDER_SITE_OTHER): Payer: Self-pay | Admitting: Sports Medicine

## 2024-04-06 ENCOUNTER — Telehealth: Payer: Self-pay | Admitting: Cardiology

## 2024-04-06 DIAGNOSIS — G8929 Other chronic pain: Secondary | ICD-10-CM

## 2024-04-06 DIAGNOSIS — M545 Low back pain, unspecified: Secondary | ICD-10-CM

## 2024-04-06 LAB — BASIC METABOLIC PANEL WITH GFR
BUN/Creatinine Ratio: 15 (ref 12–28)
BUN: 13 mg/dL (ref 8–27)
CO2: 24 mmol/L (ref 20–29)
Calcium: 9.1 mg/dL (ref 8.7–10.3)
Chloride: 103 mmol/L (ref 96–106)
Creatinine, Ser: 0.85 mg/dL (ref 0.57–1.00)
Glucose: 81 mg/dL (ref 70–99)
Potassium: 4.1 mmol/L (ref 3.5–5.2)
Sodium: 139 mmol/L (ref 134–144)
eGFR: 70 mL/min/1.73 (ref >59)

## 2024-04-06 LAB — CBC
Hematocrit: 43.2 % (ref 34.0–46.6)
Hemoglobin: 14.3 g/dL (ref 11.1–15.9)
MCH: 30 pg (ref 26.6–33.0)
MCHC: 33.1 g/dL (ref 31.5–35.7)
MCV: 91 fL (ref 79–97)
Platelets: 191 x10E3/uL (ref 150–450)
RBC: 4.77 x10E6/uL (ref 3.77–5.28)
RDW: 14 % (ref 11.7–15.4)
WBC: 5.1 x10E3/uL (ref 3.4–10.8)

## 2024-04-06 NOTE — Telephone Encounter (Signed)
 Reason for walk-in: Walk-in Reasons: lab-related request (patient needing clarity on STAT bloodwork done this morning; wants to make sure that results will be received prior to procedure tomorrow morning).  If patient is requesting to be seen today, or if patient is having symptoms:  What symptoms are being reported (if any)?  N/A  Route to triage pool and ensure Teams message has been sent to the Triage Walk-In chat.  3.   For medication samples, medication refills, HIM requests, appointment requests, lab-related requests, or form/record drop-off, please route to the appropriate pool.

## 2024-04-06 NOTE — Telephone Encounter (Signed)
 Spoke with pt in person regarding labs. Pt stated she has an ablation scheduled tomorrow and had labs drawn this morning that were not stat according to the lab techs but the pt stated they were supposed to be stat. Active stat labs were in the chart. Lab requisitions were printed and sent down stat. Pt planned to go straight to the lab. Pt also stated she wanted us  to send her records to [facility]. Pt was advised to call medical records. Pt verbalized understanding. All questions if any were answered.

## 2024-04-06 NOTE — Progress Notes (Addendum)
   FOLLOW-UP VISIT:  Shockwave Therapy Procedure   I observed and examined the patient with the SM resident and agree with assessment and plan.  Note reviewed and modified by me. PATRICE Haddock, MD  NAME:  BLONNIE MASKE DOB:  Jan 29, 1946 MR#: [id number] DATE OF VISIT:  04/06/2024  Encounter:   Patricia Flynn comes in for a follow up for Radial Extracorporeal Shockwave therapy (ESWT) treatment to the right quadratus lumborum.  Sherrian reports no significant interval change in symptoms.  She has been active with /home exercises PT.   Patient had also recently returned from [place] and noted right hip pain with brisk walking.   ESWT Procedure Note: Treatment #3  Diagnosis: Chronic right-sided low back pain without sciatica  Procedure Details  Consent for the procedure was obtained. Time out was performed. The anatomical landmarks and target areas for the procedure were identified.   PROCEDURE: ESWT was discussed with the patient in detail.  The risk and benefits were explained.  The point of maximal tenderness was identified and the oscillator probe was applied with moderate pressure. Treatment adjusted as mediated by patient feedback/pain.  Right quadratus lumborum was targeted for ESWT  Power level: 100 MJ Frequency: 14 Hz Impulses: 2500 Head size: Large  Complications:  None; patient tolerated the procedure well.  Plan:   ESWT completed today as noted above Return in 1 week for ESWT procedure Procedure aftercare reviewed Recommended avoiding strenuous activities and using OTC analgesia prn. Gave patient right hip abductor strengthening exercises to his home as well  Krystal Lowing, DO Sports Medicine Fellow

## 2024-04-07 ENCOUNTER — Other Ambulatory Visit: Payer: Self-pay | Admitting: Cardiology

## 2024-04-07 ENCOUNTER — Ambulatory Visit (HOSPITAL_COMMUNITY)
Admission: RE | Admit: 2024-04-07 | Discharge: 2024-04-07 | Disposition: A | Discharge status: Home / Self Care | Source: Ambulatory Visit | Attending: Cardiovascular Disease | Admitting: Cardiovascular Disease

## 2024-04-07 DIAGNOSIS — I77810 Thoracic aortic ectasia: Secondary | ICD-10-CM | POA: Diagnosis not present

## 2024-04-07 DIAGNOSIS — I517 Cardiomegaly: Secondary | ICD-10-CM | POA: Insufficient documentation

## 2024-04-07 DIAGNOSIS — I480 Paroxysmal atrial fibrillation: Secondary | ICD-10-CM

## 2024-04-07 MED ORDER — IOHEXOL 350 MG/ML SOLN
100.0000 mL | Freq: Once | INTRAVENOUS | Status: AC | PRN
  Administered 2024-04-07: 100 mL via INTRAVENOUS

## 2024-04-13 ENCOUNTER — Ambulatory Visit (INDEPENDENT_AMBULATORY_CARE_PROVIDER_SITE_OTHER): Admitting: Family Medicine

## 2024-04-13 ENCOUNTER — Telehealth (HOSPITAL_COMMUNITY): Payer: Self-pay

## 2024-04-13 ENCOUNTER — Encounter: Payer: Self-pay | Admitting: Family Medicine

## 2024-04-13 DIAGNOSIS — M545 Low back pain, unspecified: Secondary | ICD-10-CM

## 2024-04-13 DIAGNOSIS — G8929 Other chronic pain: Secondary | ICD-10-CM

## 2024-04-13 NOTE — Patient Instructions (Signed)

## 2024-04-13 NOTE — Telephone Encounter (Signed)
 Attempted to reach patient to discuss upcoming procedure. Spoke with patient who stated, now was not a good time and she would return the call.

## 2024-04-13 NOTE — Progress Notes (Signed)
 FOLLOW-UP VISIT:  Shockwave Therapy Procedure   NAME:  ARLEY GARANT DOB:  10/22/1945 MR#: [id number] DATE OF VISIT:  04/13/2024  Encounter:   Deane comes in for a follow up evaluation and Radial Extracorporeal Shockwave therapy (ESWT) treatment to the right quadratus lumborum.  Tishawna reports no interval change in symptoms. She has mostly been resting, however, she says that she was able to walk 2 miles yesterday and an additional 2 miles earlier this morning without her lower back tightening up.  She has been active with /home exercises PT.   ESWT Procedure Note: Treatment #4  Diagnosis: Chronic right-sided low back pain without sciatica   Procedure Details  Consent for the procedure was obtained. Time out was performed. The anatomical landmarks and target areas for the procedure were identified.   PROCEDURE: ESWT was discussed with the patient in detail.  The risk and benefits were explained.  The point of maximal tenderness was identified and the oscillator probe was applied with moderate pressure. Treatment adjusted as mediated by patient feedback/pain.  Right quadratus lumborum was targeted for ESWT   Power level: 110 MJ Frequency: 16 Hz Impulses: 2500 Head size: Large   Complications:  None; patient tolerated the procedure well.  ASSESSMENT/PLAN Chronic right-sided low back pain without sciatica  Plan:   ESWT completed today as noted above Return in 1 week for ESWT procedure #5 Procedure aftercare reviewed Recommended avoiding strenuous activities and using OTC analgesia prn.

## 2024-04-14 NOTE — Telephone Encounter (Addendum)
 Patient returned call to discuss upcoming procedure.   CT: completed.  Labs: completed.   Any recent signs of acute illness or been started on antibiotics? Pt had a dental extraction and was placed on prophylactic amoxicillin - last dose tomorrow. She denies any s/o infection. Pt had ECSWT for chronic right-sided low back pain on 8/12.   Any new medications started? No Any medications to hold? No Any missed doses of blood thinner? No Advised patient to continue taking ANTICOAGULANT: Eliquis  (Apixaban ) twice daily without missing any doses.  Medication instructions:  On the morning of your procedure DO NOT take any medication., including Eliquis  or the procedure may be rescheduled. Nothing to eat or drink after midnight prior to your procedure.  Confirmed patient is scheduled for Atrial Fibrillation Ablation on Wednesday, August 20 with Dr. Soyla Norton. Instructed patient to arrive at the Main Entrance A at [facility]: [address] [place], [place] [address] and check in at Admitting at 11:30 AM.  Advised of plan to go home the same day and will only stay overnight if medically necessary. You MUST have a responsible adult to drive you home and MUST be with you the first 24 hours after you arrive home or your procedure could be cancelled.  Patient verbalized understanding to all instructions provided and agreed to proceed with procedure.

## 2024-04-17 ENCOUNTER — Ambulatory Visit: Payer: Self-pay | Admitting: Cardiology

## 2024-04-18 ENCOUNTER — Ambulatory Visit: Payer: Self-pay | Admitting: Cardiology

## 2024-04-18 DIAGNOSIS — I77810 Thoracic aortic ectasia: Secondary | ICD-10-CM

## 2024-04-19 ENCOUNTER — Encounter: Payer: Self-pay | Admitting: Cardiology

## 2024-04-20 ENCOUNTER — Ambulatory Visit (INDEPENDENT_AMBULATORY_CARE_PROVIDER_SITE_OTHER): Payer: Self-pay | Admitting: Sports Medicine

## 2024-04-20 DIAGNOSIS — M5416 Radiculopathy, lumbar region: Secondary | ICD-10-CM

## 2024-04-20 NOTE — Telephone Encounter (Signed)
 The patient has been notified of the result and verbalized understanding.  All questions (if any) were answered. Patricia LOISE Bill, RN 04/20/2024 4:52 PM  Repeat CT ordered for 1 year out. Pt aware. Results sent to PCP.

## 2024-04-20 NOTE — Telephone Encounter (Signed)
 See results note from 8/19

## 2024-04-20 NOTE — Progress Notes (Signed)
 ESWT # 5  Patient is on shockwave treatment #5 for quadratus lumborum pain on the right low back.  She is showing significant benefit in that she was able to walk 2 miles yesterday without significant back pain.    Indication quadratus lumborum spasm on the right Head size large Impulses 3000 Power level 120 mJ Frequency 16  Patient tolerated the procedure well.  She was warned about the possibility of some bruising.  She will return for another treatment in 1 to 2 weeks.  As an aside she has bilateral lateral hip pain worse on the right side.  I did an exam and found she had significant hip abduction weakness.  She was given a home exercise series for hip abduction strengthening.

## 2024-04-20 NOTE — Telephone Encounter (Signed)
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  Previously 43 mm.  No change in therapy.  Follow up CT in one year.  Please schedule for follow up CT angiogram aorta in one year.  Call Ms. Renie with the results and  send results to Shayne Anes, MD.  Small nodule does not need follow up but we will look at it in one year.   ----- Message ----- From: Interface, Rad Results In Sent: 04/17/2024   3:39 PM EDT To: Lynwood Schilling, MD

## 2024-04-20 NOTE — Pre-Procedure Instructions (Signed)
 Attempted to call patient regarding procedure instructions.  Left voicemail on  the following items: Arrival time 1100 Nothing to eat or drink after midnight No meds AM of procedure Responsible person to drive you home and stay with you for 24 hrs  Have you missed any doses of anti-coagulant Eliquis- should be taken twice a day, if you have missed any doses please let us know.  Don't take dose morning of procedure.

## 2024-04-20 NOTE — Assessment & Plan Note (Signed)
 She continues with ESWT treatment to her quadratus lumborum. This has lessen the degree of pain she has with walking.  This does seem to lessen the amount of radiculopathy pain that she has as well.  We will continue this for symptom relief for 1-3 more sessions as needed.

## 2024-04-21 ENCOUNTER — Ambulatory Visit (HOSPITAL_BASED_OUTPATIENT_CLINIC_OR_DEPARTMENT_OTHER): Admitting: Anesthesiology

## 2024-04-21 ENCOUNTER — Other Ambulatory Visit: Payer: Self-pay

## 2024-04-21 ENCOUNTER — Ambulatory Visit (HOSPITAL_COMMUNITY): Admitting: Anesthesiology

## 2024-04-21 ENCOUNTER — Ambulatory Visit (HOSPITAL_COMMUNITY)
Admission: RE | Disposition: A | Discharge status: Home / Self Care | Payer: Self-pay | Source: Home / Self Care | Attending: Cardiology

## 2024-04-21 ENCOUNTER — Ambulatory Visit (HOSPITAL_COMMUNITY)
Admission: RE | Admit: 2024-04-21 | Discharge: 2024-04-21 | Disposition: A | Discharge status: Home / Self Care | Attending: Cardiology | Admitting: Cardiology

## 2024-04-21 DIAGNOSIS — Z87891 Personal history of nicotine dependence: Secondary | ICD-10-CM | POA: Insufficient documentation

## 2024-04-21 DIAGNOSIS — I480 Paroxysmal atrial fibrillation: Secondary | ICD-10-CM | POA: Diagnosis present

## 2024-04-21 DIAGNOSIS — I10 Essential (primary) hypertension: Secondary | ICD-10-CM | POA: Diagnosis not present

## 2024-04-21 DIAGNOSIS — I4891 Unspecified atrial fibrillation: Secondary | ICD-10-CM | POA: Diagnosis not present

## 2024-04-21 DIAGNOSIS — I483 Typical atrial flutter: Secondary | ICD-10-CM | POA: Diagnosis not present

## 2024-04-21 HISTORY — PX: ATRIAL FIBRILLATION ABLATION: EP1191

## 2024-04-21 LAB — POCT ACTIVATED CLOTTING TIME: Activated Clotting Time: 331 s

## 2024-04-21 SURGERY — ATRIAL FIBRILLATION ABLATION
Anesthesia: General

## 2024-04-21 MED ORDER — SODIUM CHLORIDE 0.9% FLUSH: 3.0000 mL | Freq: Two times a day (BID) | INTRAVENOUS | Status: DC

## 2024-04-21 MED ORDER — SUGAMMADEX SODIUM 200 MG/2ML IV SOLN
INTRAVENOUS | Status: DC | PRN
  Administered 2024-04-21: 200 mg via INTRAVENOUS

## 2024-04-21 MED ORDER — ACETAMINOPHEN 325 MG PO TABS: 650.0000 mg | ORAL_TABLET | ORAL | Status: DC | PRN

## 2024-04-21 MED ORDER — HEPARIN (PORCINE) IN NACL 1000-0.9 UT/500ML-% IV SOLN
INTRAVENOUS | Status: DC | PRN
  Administered 2024-04-21 (×3): 500 mL

## 2024-04-21 MED ORDER — ROCURONIUM BROMIDE 10 MG/ML (PF) SYRINGE
PREFILLED_SYRINGE | INTRAVENOUS | Status: DC | PRN
  Administered 2024-04-21: 50 mg via INTRAVENOUS

## 2024-04-21 MED ORDER — LACTATED RINGERS IV SOLN: INTRAVENOUS | Status: DC | PRN

## 2024-04-21 MED ORDER — ONDANSETRON HCL 4 MG/2ML IJ SOLN: 4.0000 mg | Freq: Four times a day (QID) | INTRAMUSCULAR | Status: DC | PRN

## 2024-04-21 MED ORDER — FENTANYL CITRATE (PF) 250 MCG/5ML IJ SOLN
INTRAMUSCULAR | Status: DC | PRN
  Administered 2024-04-21: 50 ug via INTRAVENOUS
  Administered 2024-04-21: 100 ug via INTRAVENOUS

## 2024-04-21 MED ORDER — ATROPINE SULFATE 1 MG/ML IV SOLN
INTRAVENOUS | Status: DC | PRN
  Administered 2024-04-21: 1 mg via INTRAVENOUS

## 2024-04-21 MED ORDER — SODIUM CHLORIDE 0.9% FLUSH: 3.0000 mL | INTRAVENOUS | Status: DC | PRN

## 2024-04-21 MED ORDER — SODIUM CHLORIDE 0.9 % IV SOLN: 250.0000 mL | INTRAVENOUS | Status: DC | PRN

## 2024-04-21 MED ORDER — HEPARIN SODIUM (PORCINE) 1000 UNIT/ML IJ SOLN
INTRAMUSCULAR | Status: DC | PRN
Start: 2024-04-21 — End: 2024-04-21
  Administered 2024-04-21: 14000 [IU] via INTRAVENOUS

## 2024-04-21 MED ORDER — PROTAMINE SULFATE 10 MG/ML IV SOLN
INTRAVENOUS | Status: DC | PRN
  Administered 2024-04-21: 40 mg via INTRAVENOUS

## 2024-04-21 MED ORDER — LIDOCAINE 2% (20 MG/ML) 5 ML SYRINGE
INTRAMUSCULAR | Status: DC | PRN
  Administered 2024-04-21: 50 mg via INTRAVENOUS

## 2024-04-21 MED ORDER — PROPOFOL 10 MG/ML IV BOLUS
INTRAVENOUS | Status: DC | PRN
  Administered 2024-04-21: 100 mg via INTRAVENOUS

## 2024-04-21 MED ORDER — FENTANYL CITRATE (PF) 100 MCG/2ML IJ SOLN
INTRAMUSCULAR | Status: AC
  Filled 2024-04-21: qty 2

## 2024-04-21 MED ORDER — SODIUM CHLORIDE 0.9 % IV SOLN: INTRAVENOUS | Status: DC

## 2024-04-21 MED ORDER — PHENYLEPHRINE 80 MCG/ML (10ML) SYRINGE FOR IV PUSH (FOR BLOOD PRESSURE SUPPORT)
PREFILLED_SYRINGE | INTRAVENOUS | Status: DC | PRN
  Administered 2024-04-21 (×2): 80 ug via INTRAVENOUS
  Administered 2024-04-21: 200 ug via INTRAVENOUS

## 2024-04-21 MED ORDER — ONDANSETRON HCL 4 MG/2ML IJ SOLN
INTRAMUSCULAR | Status: DC | PRN
  Administered 2024-04-21: 4 mg via INTRAVENOUS

## 2024-04-21 MED ORDER — PROPOFOL 500 MG/50ML IV EMUL
INTRAVENOUS | Status: DC | PRN
  Administered 2024-04-21: 150 ug/kg/min via INTRAVENOUS

## 2024-04-21 MED ORDER — PHENYLEPHRINE HCL-NACL 20-0.9 MG/250ML-% IV SOLN
INTRAVENOUS | Status: DC | PRN
  Administered 2024-04-21: 40 ug/min via INTRAVENOUS

## 2024-04-21 MED ORDER — DEXAMETHASONE SODIUM PHOSPHATE 10 MG/ML IJ SOLN
INTRAMUSCULAR | Status: DC | PRN
  Administered 2024-04-21: 10 mg via INTRAVENOUS

## 2024-04-21 MED ORDER — NITROGLYCERIN 1 MG/10 ML FOR IR/CATH LAB
INTRA_ARTERIAL | Status: DC | PRN
  Administered 2024-04-21: 200 ug via INTRAVENOUS

## 2024-04-21 SURGICAL SUPPLY — 16 items
CABLE FARASTAR GEN2 SNGL USE (CABLE) IMPLANT
CATH ACUNAV GE 8F-90 (CATHETERS) IMPLANT
CATH FARAWAVE NAV 31 (CATHETERS) IMPLANT
CATH WEB BIDIR CS D-F NONAUTO (CATHETERS) IMPLANT
CLOSURE MYNX CONTROL 6F/7F (Vascular Products) IMPLANT
CLOSURE PERCLOSE PROSTYLE (VASCULAR PRODUCTS) IMPLANT
COVER SWIFTLINK CONNECTOR (BAG) ×2 IMPLANT
DILATOR VESSEL 38 20CM 16FR (INTRODUCER) IMPLANT
GUIDEWIRE INQWIRE 1.5J.035X260 (WIRE) IMPLANT
KIT PATCH RHYTHMIA HDX (MISCELLANEOUS) IMPLANT
PACK EP LF (CUSTOM PROCEDURE TRAY) ×2 IMPLANT
PAD DEFIB RADIO PHYSIO CONN (PAD) ×2 IMPLANT
SHEATH FARADRIVE STEERABLE (SHEATH) IMPLANT
SHEATH PINNACLE 8F 10CM (SHEATH) IMPLANT
SHEATH PINNACLE 9F 10CM (SHEATH) IMPLANT
SHEATH PROBE COVER 6X72 (BAG) IMPLANT

## 2024-04-21 NOTE — Transfer of Care (Signed)
 Immediate Anesthesia Transfer of Care Note  Patient: Patricia Flynn  Procedure(s) Performed: ATRIAL FIBRILLATION ABLATION  Patient Location: PACU  Anesthesia Type:General  Level of Consciousness: awake, alert , and oriented  Airway & Oxygen Therapy: Patient Spontanous Breathing and Patient connected to nasal cannula oxygen  Post-op Assessment: Report given to RN and Post -op Vital signs reviewed and stable  Post vital signs: Reviewed and stable  Last Vitals:  Vitals Value Taken Time  BP 89/66 04/21/24 15:10  Temp 35.8 C 04/21/24 15:08  Pulse 67 04/21/24 15:13  Resp 14 04/21/24 15:13  SpO2 94 % 04/21/24 15:13  Vitals shown include unfiled device data.  Last Pain:  Vitals:   04/21/24 1508  TempSrc: Temporal  PainSc: Asleep         Complications: There were no known notable events for this encounter.

## 2024-04-21 NOTE — H&P (Signed)
  Electrophysiology Office Note:   Date:  04/21/2024  ID:  Patricia Flynn, Patricia Flynn [birth year], MRN [id number]  Primary Cardiologist: Lynwood Schilling, MD Primary Heart Failure: None Electrophysiologist: Nyeli Holtmeyer Gladis Norton, MD      History of Present Illness:   Patricia Flynn is a 78 y.o. female with h/o aortic aneurysm, left-sided breast cancer, hyperlipidemia, hypertension, atrial fibrillation/flutter seen today for routine electrophysiology followup.   Today, denies symptoms of palpitations, chest pain, dyspnea, orthopnea, PND, lower extremity edema, claudication, dizziness, presyncope, syncope, bleeding, or neurologic sequela. The patient is tolerating medications without difficulties. Plan ablation today.   EP Information / Studies Reviewed:    EKG is ordered today. Personal review as below.        Risk Assessment/Calculations:    CHA2DS2-VASc Score =     This indicates a  % annual risk of stroke. The patient's score is based upon:               Physical Exam:   VS:  BP 135/84   Pulse 60   Temp 98.4 F (36.9 C)   Resp 16   Ht 5' 6 (1.676 m)   Wt 62.1 kg   SpO2 99%   BMI 22.11 kg/m    Wt Readings from Last 3 Encounters:  04/21/24 62.1 kg  03/16/24 62.6 kg  02/17/24 61.7 kg     GEN: Well nourished, well developed in no acute distress NECK: No JVD; No carotid bruits CARDIAC: Regular rate and rhythm, no murmurs, rubs, gallops RESPIRATORY:  Clear to auscultation without rales, wheezing or rhonchi  ABDOMEN: Soft, non-tender, non-distended EXTREMITIES:  No edema; No deformity    ASSESSMENT AND PLAN:    1.  Paroxysmal atrial fibrillation/flutter: ALEASE Flynn has presented today for surgery, with the diagnosis of AF.  The various methods of treatment have been discussed with the patient and family. After consideration of risks, benefits and other options for treatment, the patient has consented to  Procedure(s): Catheter ablation as a surgical intervention .  Risks  include but not limited to complete heart block, stroke, esophageal damage, nerve damage, bleeding, vascular damage, tamponade, perforation, MI, and death. The patient's history has been reviewed, patient examined, no change in status, stable for surgery.  I have reviewed the patient's chart and labs.  Questions were answered to the patient's satisfaction.    Patricia Coye Norton, MD 04/21/2024 12:00 PM

## 2024-04-21 NOTE — Progress Notes (Signed)
 Ambulated to the bathroom. Bilateral groin sites clean, dry, and intact. Patient was able to void.

## 2024-04-21 NOTE — Discharge Instructions (Signed)

## 2024-04-21 NOTE — Anesthesia Preprocedure Evaluation (Signed)
 Anesthesia Evaluation  Patient identified by MRN, date of birth, ID band Patient awake    Reviewed: Allergy & Precautions, NPO status , Patient's Chart, lab work & pertinent test results  History of Anesthesia Complications Negative for: history of anesthetic complications  Airway Mallampati: II  TM Distance: >3 FB Neck ROM: Full    Dental  (+) Dental Advisory Given   Pulmonary neg shortness of breath, neg sleep apnea, neg COPD, neg recent URI, former smoker   breath sounds clear to auscultation       Cardiovascular hypertension, + dysrhythmias Atrial Fibrillation  Rhythm:Regular  1. The left ventricle has low normal systolic function, with an ejection  fraction of 50-55%. The cavity size was normal. Left ventricular diastolic  Doppler parameters are consistent with impaired relaxation.   2. The right ventricle has normal systolic function. The cavity was  normal.   3. The mitral valve is grossly normal.   4. The tricuspid valve is grossly normal.   5. The aortic valve is tricuspid. Mild thickening of the aortic valve. No  stenosis of the aortic valve.   6. The aorta is abnormal in size and structure.   7. There is mild dilatation of the ascending aorta measuring 38 mm.   8. The inferior vena cava was dilated in size with >50% respiratory  variability.   9. Normal LV systolic function; mild diastolic dysfunction; mildly  dilated ascending aorta.     Neuro/Psych    GI/Hepatic ,GERD  ,,  Endo/Other  negative endocrine ROS    Renal/GU negative Renal ROS     Musculoskeletal negative musculoskeletal ROS (+)    Abdominal   Peds  Hematology  (+) Blood dyscrasia Lab Results      Component                Value               Date                      WBC                      5.1                 04/06/2024                HGB                      14.3                04/06/2024                HCT                      43.2                 04/06/2024                MCV                      91                  04/06/2024                PLT                      191  04/06/2024             eliquis    Anesthesia Other Findings   Reproductive/Obstetrics                              Anesthesia Physical Anesthesia Plan  ASA: 2  Anesthesia Plan: General   Post-op Pain Management: Minimal or no pain anticipated   Induction: Intravenous  PONV Risk Score and Plan: 3 and Ondansetron , Propofol  infusion, Dexamethasone  and TIVA  Airway Management Planned: Oral ETT  Additional Equipment: None  Intra-op Plan:   Post-operative Plan: Extubation in OR  Informed Consent: I have reviewed the patients History and Physical, chart, labs and discussed the procedure including the risks, benefits and alternatives for the proposed anesthesia with the patient or authorized representative who has indicated his/her understanding and acceptance.     Dental advisory given  Plan Discussed with: CRNA  Anesthesia Plan Comments:          Anesthesia Quick Evaluation

## 2024-04-21 NOTE — Anesthesia Procedure Notes (Signed)
 Procedure Name: Intubation Date/Time: 04/21/2024 1:22 PM  Performed by: Scherrie Mast, CRNAPre-anesthesia Checklist: Patient identified, Emergency Drugs available, Suction available and Patient being monitored Patient Re-evaluated:Patient Re-evaluated prior to induction Oxygen Delivery Method: Circle System Utilized Preoxygenation: Pre-oxygenation with 100% oxygen Induction Type: IV induction Ventilation: Mask ventilation without difficulty Laryngoscope Size: Mac Grade View: Grade I Tube type: Oral Tube size: 6.5 mm Number of attempts: 1 Airway Equipment and Method: Stylet and Oral airway Placement Confirmation: ETT inserted through vocal cords under direct vision, positive ETCO2 and breath sounds checked- equal and bilateral Secured at: 21 cm Tube secured with: Tape Dental Injury: Teeth and Oropharynx as per pre-operative assessment

## 2024-04-21 NOTE — Anesthesia Postprocedure Evaluation (Signed)
 Anesthesia Post Note  Patient: Patricia Flynn  Procedure(s) Performed: ATRIAL FIBRILLATION ABLATION     Patient location during evaluation: PACU Anesthesia Type: General Level of consciousness: awake and alert Pain management: pain level controlled Vital Signs Assessment: post-procedure vital signs reviewed and stable Respiratory status: spontaneous breathing, nonlabored ventilation, respiratory function stable and patient connected to nasal cannula oxygen Cardiovascular status: blood pressure returned to baseline and stable Postop Assessment: no apparent nausea or vomiting Anesthetic complications: no Comments: C/o left eye foreign body sensation. No obvious foreign body visualized; no crusting or conjunctival injection. No vision change with right eye occluded. Advised her that this is likely a mild corneal abrasion which should resolve in the next day or so. She will contact our office for further instructions if she has worsening symptoms or signs of infection.   There were no known notable events for this encounter.  Last Vitals:  Vitals:   04/21/24 1645 04/21/24 1700  BP: 117/77 120/81  Pulse: 61 (!) 59  Resp: 12 (!) 8  Temp:    SpO2: 91% 96%    Last Pain:  Vitals:   04/21/24 1606  TempSrc:   PainSc: 0-No pain                 Lynwood MARLA Cornea

## 2024-04-22 ENCOUNTER — Telehealth (HOSPITAL_COMMUNITY): Payer: Self-pay

## 2024-04-22 ENCOUNTER — Encounter (HOSPITAL_COMMUNITY): Payer: Self-pay | Admitting: Cardiology

## 2024-04-22 NOTE — Telephone Encounter (Signed)
 Spoke with patient to complete post procedure follow up call.  Patient reports no complications with groin sites.   Instructions reviewed with patient:  Remove large bandage at puncture site after 24 hours. It is normal to have bruising, tenderness, mild swelling, and a pea or marble sized lump/knot at the groin site which can take up to three months to resolve.  Get help right away if you notice sudden swelling at the puncture site.  Check your puncture site every day for signs of infection: fever, redness, swelling, pus drainage, warmth, foul odor or excessive pain. If this occurs, please call the office at [phone], to speak with the nurse. Get help right away if your puncture site is bleeding and the bleeding does not stop after applying firm pressure to the area.  You may continue to have skipped beats/ atrial fibrillation during the first several months after your procedure.  It is very important not to miss any doses of your blood thinner Eliquis . You will follow up with the Afib clinic on 05/20/24 and follow up with the Afib clinic on 07/23/24.   Patient verbalized understanding to all instructions provided.

## 2024-04-22 NOTE — Anesthesia Postprocedure Evaluation (Signed)
 Anesthesia Post Note  Patient: Patricia Flynn  Procedure(s) Performed: ATRIAL FIBRILLATION ABLATION     Patient location during evaluation: Cath Lab Anesthesia Type: General Level of consciousness: awake and alert Pain management: pain level controlled Vital Signs Assessment: post-procedure vital signs reviewed and stable Respiratory status: spontaneous breathing, nonlabored ventilation and respiratory function stable Cardiovascular status: blood pressure returned to baseline and stable Postop Assessment: no apparent nausea or vomiting Anesthetic complications: no   There were no known notable events for this encounter.                  Donis Kotowski

## 2024-04-23 MED FILL — Fentanyl Citrate Preservative Free (PF) Inj 100 MCG/2ML: INTRAMUSCULAR | Qty: 3 | Status: AC

## 2024-04-27 ENCOUNTER — Ambulatory Visit: Payer: Self-pay | Admitting: Sports Medicine

## 2024-05-11 ENCOUNTER — Ambulatory Visit: Payer: Self-pay | Admitting: Sports Medicine

## 2024-05-11 VITALS — BP 112/60 | Ht 66.0 in | Wt 137.0 lb

## 2024-05-11 DIAGNOSIS — G8929 Other chronic pain: Secondary | ICD-10-CM

## 2024-05-11 DIAGNOSIS — M545 Low back pain, unspecified: Secondary | ICD-10-CM | POA: Diagnosis not present

## 2024-05-11 MED ORDER — AMITRIPTYLINE HCL 10 MG PO TABS
10.0000 mg | ORAL_TABLET | Freq: Every day | ORAL | 1 refills | Status: AC
Start: 2024-05-11 — End: ?

## 2024-05-11 NOTE — Progress Notes (Signed)
 PCP: Shayne Anes, MD  Subjective:   HPI: Patient is a 78 y.o. female here for chronic quadratus lumborum spasm.  Patient previously tried 5 sessions of shockwave therapy and has had 0 improvement.  Patient has a history of sciatica which has improved on the right side, as well as scoliosis.  She recently had ablation and is on a blood thinner.  She still has persistent spasm and tightening of her quadratus lumborum on the right.  She has been taking Aleve periodically during her golfing which helps.  She is aware that this is not recommended.  She denies any tingling or numbness or weakness.  She is using a heat pad, stretching the morning and walking.  She has not had trigger point injections, acupuncture, muscle relaxers.  She states that this does not wake her up at night.  Past Medical History:  Diagnosis Date   Aortic aneurysm (HCC)    Diverticulosis of colon (without mention of hemorrhage)    Elevated LFTs    GERD (gastroesophageal reflux disease)    History of breast cancer 1995   left   Hyperlipidemia    Hypertension 2012    Current Outpatient Medications on File Prior to Visit  Medication Sig Dispense Refill   conjugated estrogens  (PREMARIN ) vaginal cream Use pea sized amount externally twice weekly 30 g 1   ELIQUIS  5 MG TABS tablet TAKE ONE TABLET BY MOUTH TWICE DAILY 60 tablet 6   Estradiol  10 MCG TABS vaginal tablet Place 1 tablet (10 mcg total) vaginally 2 (two) times a week. 24 tablet 3   flecainide  (TAMBOCOR ) 50 MG tablet TAKE ONE TABLET TWICE DAILY 180 tablet 2   NASCOBAL  500 MCG/0.1ML SOLN Place 500 mcg into both nostrils once a week.     nebivolol  (BYSTOLIC ) 2.5 MG tablet Take 1 tablet (2.5 mg total) by mouth in the morning and at bedtime. 90 tablet 3   omeprazole  (PRILOSEC ) 20 MG capsule Take 1 capsule (20 mg total) by mouth daily. Office visit for further refills 90 capsule 0   rosuvastatin (CRESTOR) 5 MG tablet Take 5 mg by mouth 4 (four) times a week.     No  current facility-administered medications on file prior to visit.    BP 112/60   Ht 5' 6 (1.676 m)   Wt 137 lb (62.1 kg)   BMI 22.11 kg/m        Objective:   Physical Exam:  Gen: NAD, comfortable in exam room Right lumbar region Inspection: Free from erythema, edema or warmth Palpation: Quadratus lumborum on the right is spasmed, taut from T8-L1 ROM: Patient is able to forward flex without difficulty and this relieves her pain Special Tests: Straight leg test negative bilaterally Neuro: Strength 5/5 with hip flexion, knee extension and flexion, dorsiflexion plantarflexion, sensation is intact distally and equal bilaterally  Assessment/Plan:   Patricia Flynn is a 78 y.o. female who was seen today for the following: 1. Chronic right-sided low back pain without sciatica (Primary) - Reviewed MRI that was obtained within the last 10 months which did show some narrowing of her lumbar spinal canal - Explained to patient there is concern for an impingement of the nerve as it exits in this region causing her quadratus lumborum to spasm - Had a discussion with the patient about starting amitriptyline  10 mg at bedtime - Discussed side effects with patient and plan of side effect should occur - Will start at a low dose and increase as tolerated - Follow-up in  1 month - amitriptyline  (ELAVIL ) 10 MG tablet; Take 1 tablet (10 mg total) by mouth at bedtime.  Dispense: 30 tablet; Refill: 1   Follow-up/Education:   No follow-ups on file.   May return sooner as needed and encouraged to call/e-mail for additional questions or  worsening symptoms in the interim.  Krystal Lowing, DO Sports Medicine Fellow 05/11/2024 12:53 PM

## 2024-05-19 ENCOUNTER — Ambulatory Visit (HOSPITAL_COMMUNITY): Admitting: Internal Medicine

## 2024-05-20 ENCOUNTER — Ambulatory Visit (HOSPITAL_COMMUNITY)
Admission: RE | Admit: 2024-05-20 | Discharge: 2024-05-20 | Disposition: A | Discharge status: Home / Self Care | Source: Ambulatory Visit | Attending: Internal Medicine | Admitting: Internal Medicine

## 2024-05-20 ENCOUNTER — Encounter (HOSPITAL_COMMUNITY): Payer: Self-pay | Admitting: Internal Medicine

## 2024-05-20 VITALS — BP 122/90 | HR 57 | Ht 66.0 in | Wt 143.0 lb

## 2024-05-20 DIAGNOSIS — I480 Paroxysmal atrial fibrillation: Secondary | ICD-10-CM | POA: Diagnosis not present

## 2024-05-20 DIAGNOSIS — Z79899 Other long term (current) drug therapy: Secondary | ICD-10-CM

## 2024-05-20 DIAGNOSIS — D6869 Other thrombophilia: Secondary | ICD-10-CM | POA: Diagnosis not present

## 2024-05-20 DIAGNOSIS — Z5181 Encounter for therapeutic drug level monitoring: Secondary | ICD-10-CM

## 2024-05-20 NOTE — Progress Notes (Addendum)
 Primary Care Physician: Shayne Anes, MD Primary Cardiologist: Lynwood Schilling, MD Electrophysiologist: Will Gladis Norton, MD     Referring Physician: Dr. Norton Mullet Patricia Flynn is a 78 y.o. female with a history of aortic aneurysm, left-sided breast cancer, HLD, HTN, and atrial fibrillation/flutter who presents for consultation in the [facility]. Patient is on Eliquis  5 mg BID for a CHADS2VASC score of 4.  On evaluation 05/20/24, patient is currently in NSR. S/p Afib/flutter ablation on 04/21/24 by Dr. Norton. No episodes of Afib since ablation. She is taking flecainide  50 mg BID. She maybe feels less fatigue compared to previous. No chest pain or SOB. Leg sites healed without issue. No missed doses of anticoagulant.  Today, she denies symptoms of orthopnea, PND, lower extremity edema, dizziness, presyncope, syncope, snoring, daytime somnolence, bleeding, or neurologic sequela. The patient is tolerating medications without difficulties and is otherwise without complaint today.    she has a BMI of Body mass index is 23.08 kg/m.[name] Filed Weights   05/20/24 1135  Weight: 64.9 kg    Current Outpatient Medications  Medication Sig Dispense Refill   amitriptyline  (ELAVIL ) 10 MG tablet Take 1 tablet (10 mg total) by mouth at bedtime. 30 tablet 1   conjugated estrogens  (PREMARIN ) vaginal cream Use pea sized amount externally twice weekly 30 g 1   ELIQUIS  5 MG TABS tablet TAKE ONE TABLET BY MOUTH TWICE DAILY 60 tablet 6   Estradiol  10 MCG TABS vaginal tablet Place 1 tablet (10 mcg total) vaginally 2 (two) times a week. 24 tablet 3   flecainide  (TAMBOCOR ) 50 MG tablet TAKE ONE TABLET TWICE DAILY 180 tablet 2   NASCOBAL  500 MCG/0.1ML SOLN Place 500 mcg into both nostrils once a week.     nebivolol  (BYSTOLIC ) 2.5 MG tablet Take 1 tablet (2.5 mg total) by mouth in the morning and at bedtime. 90 tablet 3   omeprazole  (PRILOSEC ) 20 MG capsule Take 1 capsule (20 mg  total) by mouth daily. Office visit for further refills 90 capsule 0   rosuvastatin (CRESTOR) 5 MG tablet Take 5 mg by mouth 4 (four) times a week.     No current facility-administered medications for this encounter.    Atrial Fibrillation Management history:  Previous antiarrhythmic drugs: flecainide  Previous cardioversions: none Previous ablations: 04/21/24 Anticoagulation history: Eliquis    ROS- All systems are reviewed and negative except as per the HPI above.  Physical Exam: BP (!) 122/90   Pulse (!) 57   Ht 5' 6 (1.676 m)   Wt 64.9 kg   BMI 23.08 kg/m   GEN: Well nourished, well developed in no acute distress NECK: No JVD; No carotid bruits CARDIAC: Regular rate and rhythm, no murmurs, rubs, gallops RESPIRATORY:  Clear to auscultation without rales, wheezing or rhonchi  ABDOMEN: Soft, non-tender, non-distended EXTREMITIES:  No edema; No deformity   EKG today demonstrates  Vent. rate 57 BPM PR interval 198 ms QRS duration 88 ms QT/QTcB 458/445 ms P-R-T axes 63 -12 66 Sinus bradycardia Septal infarct , age undetermined T wave abnormality, consider anterior ischemia Abnormal ECG When compared with ECG of 21-Apr-2024 15:21, Previous ECG is present  Echo 04/08/2019 demonstrated  1. The left ventricle has low normal systolic function, with an ejection  fraction of 50-55%. The cavity size was normal. Left ventricular diastolic  Doppler parameters are consistent with impaired relaxation.   2. The right ventricle has normal systolic function. The cavity was  normal.  3. The mitral valve is grossly normal.   4. The tricuspid valve is grossly normal.   5. The aortic valve is tricuspid. Mild thickening of the aortic valve. No  stenosis of the aortic valve.   6. The aorta is abnormal in size and structure.   7. There is mild dilatation of the ascending aorta measuring 38 mm.   8. The inferior vena cava was dilated in size with >50% respiratory  variability.   9.  Normal LV systolic function; mild diastolic dysfunction; mildly  dilated ascending aorta.   ASSESSMENT & PLAN CHA2DS2-VASc Score = 4  The patient's score is based upon: CHF History: 0 HTN History: 1 Diabetes History: 0 Stroke History: 0 Vascular Disease History: 0 Age Score: 2 Gender Score: 1       ASSESSMENT AND PLAN: Paroxysmal Atrial Fibrillation (ICD10:  I48.0) The patient's CHA2DS2-VASc score is 4, indicating a 4.8% annual risk of stroke.   S/p Afib/flutter ablation on 04/21/24 by Dr. Inocencio.  Patient is currently in NSR. Anticipate can discontinue flecainide  at upcoming office visit.    Secondary Hypercoagulable State (ICD10:  D68.69) The patient is at significant risk for stroke/thromboembolism based upon her CHA2DS2-VASc Score of 4.  Continue Apixaban  (Eliquis ).   Continue Eliquis .    High risk medication monitoring (ICD10: [id number]) Patient requires ongoing monitoring for anti-arrhythmic medication which has the potential to cause life threatening arrhythmias or AV block. ECG intervals are stable. Continue flecainide  50 mg BID. Class B interaction with amitriptyline  - okay to continue both.  Continue Bystolic  2.5 mg BID.    Follow up with EP as scheduled.    [name], [facility]  Afib Clinic [address] [place], [place] [address] [phone]

## 2024-06-09 ENCOUNTER — Ambulatory Visit

## 2024-06-10 ENCOUNTER — Other Ambulatory Visit: Payer: Self-pay | Admitting: Cardiology

## 2024-06-10 ENCOUNTER — Telehealth: Payer: Self-pay | Admitting: Cardiology

## 2024-06-10 MED ORDER — NEBIVOLOL HCL 2.5 MG PO TABS: 2.5000 mg | ORAL_TABLET | Freq: Two times a day (BID) | ORAL | 2 refills | Status: AC

## 2024-06-10 NOTE — Telephone Encounter (Signed)
*  STAT* If patient is at the pharmacy, call can be transferred to refill team.   1. Which medications need to be refilled? (please list name of each medication and dose if known)  nebivolol  (BYSTOLIC ) 2.5 MG tablet   2. Which pharmacy/location (including street and city if local pharmacy) is medication to be sent to? [id number] - [place], [place] - [address] AT [facility] OF [address]  3. Do they need a 30 day or 90 day supply?  90 day supply

## 2024-06-10 NOTE — Telephone Encounter (Signed)
 Pt's medication was sent to pt's pharmacy as requested. Confirmation received.

## 2024-07-06 ENCOUNTER — Telehealth: Payer: Self-pay | Admitting: Cardiology

## 2024-07-06 NOTE — Telephone Encounter (Signed)
   Patient Name: Patricia Flynn  DOB: 1946/07/03 MRN: [id number]  Primary Cardiologist: Lynwood Schilling, MD  Chart reviewed as part of pre-operative protocol coverage.   IF SIMPLE EXTRACTION/CLEANINGS: Simple dental extractions (i.e. 1-2 teeth) are considered low risk procedures per guidelines and generally do not require any specific cardiac clearance. It is also generally accepted that for simple extractions and dental cleanings, there is no need to interrupt blood thinner therapy.  SBE prophylaxis is not required for the patient from a cardiac standpoint.  I will route this recommendation to the requesting party via Epic fax function and remove from pre-op pool.  Please call with questions.  Rosaline EMERSON Bane, NP-C 07/06/2024, 11:17 AM [address], [address] [place], [place] [address] Office [phone] Fax [phone]

## 2024-07-06 NOTE — Telephone Encounter (Signed)
 I called the DDS to clarify the procedure as the fax that I received stated dental implant only. Though I see the request was sent to preop APP stating dental extraction.   I confirmed with dental office the pt has already had the extraction and this procedure is only for the dental implant. Also was stated to me that the pt would like to have this done by tomorrow afternoon if possible.    I will update the preop APP the wrong procedure was given to preop.

## 2024-07-06 NOTE — Telephone Encounter (Signed)
 Clearance covers all simple dental procedures including dental extractions, implants, cleanings, fillings, and crowns.  Recommendation is to continue Eliquis  as bleeding risk is low and patient does not require SBE prophylaxis.  Rosaline EMERSON Bane, NP-C  07/06/2024, 2:12 PM [address] [place], [place] [address] Office [phone] Fax [phone]

## 2024-07-06 NOTE — Telephone Encounter (Signed)
   Pre-operative Risk Assessment    Patient Name: Patricia Flynn  DOB: [birth year] MRN: [id number]   Date of last office visit: 01/22/24 Date of next office visit: TBD   Request for Surgical Clearance    Procedure:  Dental Extraction - Amount of Teeth to be Pulled:  1  Date of Surgery:  Clearance 07/08/24                                Surgeon:  Ozell Sis  Surgeon's Group or Practice Name:  [facility] Phone number:  [phone] Fax number:  [phone]   Type of Clearance Requested:   - Pharmacy:  Hold Apixaban  (Eliquis ) TBD    Type of Anesthesia:  Local    Additional requests/questions:    [name]   07/06/2024, 10:53 AM

## 2024-07-06 NOTE — Telephone Encounter (Signed)
 I will update the requesting dental office to see notes from preop APP Rosaline Bane, NP. See recommendations about Eliquis .

## 2024-07-08 NOTE — Telephone Encounter (Signed)
 I s/w the pt and assured her that we did fax the notes providing clearance for the dental implant to the dental office x 2 11/4 and 07/07/24. Pt's phone cut off. I then called the DDS and reviewed the notes from 07/06/24 when I s/w the DDS. See notes 11/4 stating for procedure dental implant.   I will re-fax the notes today again as well. DDS is aware no SBE/ABX needed and no need to to stop her blood thinner.

## 2024-07-08 NOTE — Telephone Encounter (Signed)
 Patient is following up requesting updates on clearance request. She says her dental work is scheduled for this afternoon at 4:00 PM.

## 2024-07-22 ENCOUNTER — Ambulatory Visit (HOSPITAL_COMMUNITY)
Admission: RE | Admit: 2024-07-22 | Discharge: 2024-07-22 | Disposition: A | Discharge status: Home / Self Care | Source: Ambulatory Visit | Attending: Internal Medicine | Admitting: Internal Medicine

## 2024-07-22 ENCOUNTER — Telehealth: Payer: Self-pay | Admitting: Cardiology

## 2024-07-22 VITALS — BP 126/86 | HR 71 | Ht 66.0 in | Wt 141.4 lb

## 2024-07-22 DIAGNOSIS — I4891 Unspecified atrial fibrillation: Secondary | ICD-10-CM

## 2024-07-22 DIAGNOSIS — R002 Palpitations: Secondary | ICD-10-CM

## 2024-07-22 NOTE — Telephone Encounter (Signed)
 Pt reporting that she was out at a social gathering, sitting in the living room and my heart started racing.  Episode lasted 20 minutes, HR in the 80s (says that normally her HR is 58-60). This morning she got up and was fine, took a walk, ran an errand and it started again.  Last short time again, BP was normal, HR 73. She thinks she possibly had another episode about 10 days ago.  She does have an apple watch but it has not alerted to afib, only to elevated HR (70-80s). Pt is asymptomatic.  Reports that she did not take her Flecainide  this morning and she has looked up this medication online and it could be the cause of her symptoms so she has stopped it.  Educated to what this medication is used for and that it would not cause elevated heart rates but its goal would be to prevent the abnormal heart rhythm.  Aware that she may need a monitor to determine what exactly is occurring.  Pt insistent on being seen today.  Informed pt that with reported concerns and asymptomatic that we would keep appt for tomorrow as nothing acute is occurring.  Aware I will discuss with afib clinic and let her know if they have other recommendation.  (Dr. Inocencio did call me soon after speaking with this pt.  I informed him of what pt reported and advisement that was given.  MD agreeable pt should keep routine post ablation follow up tomorrow)

## 2024-07-22 NOTE — Progress Notes (Addendum)
 Primary Care Physician: Shayne Anes, MD Primary Cardiologist: Lynwood Schilling, MD Electrophysiologist: Will Gladis Norton, MD     Referring Physician: Dr. Norton Mullet Patricia Flynn is a 78 y.o. female with a history of aortic aneurysm, left-sided breast cancer, HLD, HTN, and atrial fibrillation/flutter who presents for consultation in the [facility]. Patient is on Eliquis  5 mg BID for a CHADS2VASC score of 4. S/p Afib/flutter ablation on 04/21/24 by Dr. Norton. She is taking flecainide  50 mg BID.   On follow-up 07/22/2024, patient is currently in NSR.  Patient contacted clinic this morning noting to have intermittent A-fib since last night.  Patient notes last night she was at a social gathering and felt her heart race for approximately 20 minutes.  She noted her heart rate was in the 80s and normally it is around 60 bpm.  She noted another brief episode began earlier this morning.  She does have an Apple Watch but it has not alerted to A-fib, only to elevated heart rate compared to baseline.  She did not take her flecainide  this morning because she looked up this medication online and noted it could be the cause of her symptoms so she has stopped it.  Patient concerned overall with the uncertainty of why her heart rate has been elevated the past 2 days and if it is her new baseline.  No missed doses of Eliquis .  Today, she denies symptoms of orthopnea, PND, lower extremity edema, dizziness, presyncope, syncope, snoring, daytime somnolence, bleeding, or neurologic sequela. The patient is tolerating medications without difficulties and is otherwise without complaint today.    she has a BMI of Body mass index is 22.82 kg/m.[name] Filed Weights   07/22/24 1543  Weight: 64.1 kg     Current Outpatient Medications  Medication Sig Dispense Refill   conjugated estrogens  (PREMARIN ) vaginal cream Use pea sized amount externally twice weekly 30 g 1   ELIQUIS  5 MG TABS  tablet TAKE ONE TABLET BY MOUTH TWICE DAILY 60 tablet 6   Estradiol  10 MCG TABS vaginal tablet Place 1 tablet (10 mcg total) vaginally 2 (two) times a week. 24 tablet 3   flecainide  (TAMBOCOR ) 50 MG tablet TAKE ONE TABLET TWICE DAILY 180 tablet 2   NASCOBAL  500 MCG/0.1ML SOLN Place 500 mcg into both nostrils once a week.     nebivolol  (BYSTOLIC ) 2.5 MG tablet Take 1 tablet (2.5 mg total) by mouth in the morning and at bedtime. 180 tablet 2   omeprazole  (PRILOSEC ) 20 MG capsule Take 1 capsule (20 mg total) by mouth daily. Office visit for further refills 90 capsule 0   rosuvastatin (CRESTOR) 5 MG tablet Take 5 mg by mouth 4 (four) times a week.     amitriptyline  (ELAVIL ) 10 MG tablet Take 1 tablet (10 mg total) by mouth at bedtime. (Patient not taking: Reported on 07/22/2024) 30 tablet 1   No current facility-administered medications for this encounter.    Atrial Fibrillation Management history:  Previous antiarrhythmic drugs: flecainide  Previous cardioversions: none Previous ablations: 04/21/24 Anticoagulation history: Eliquis    ROS- All systems are reviewed and negative except as per the HPI above.  Physical Exam: BP 126/86   Pulse 71   Ht 5' 6 (1.676 m)   Wt 64.1 kg   BMI 22.82 kg/m   GEN- The patient is well appearing, alert and oriented x 3 today.   Neck - no JVD or carotid bruit noted Lungs- Clear to ausculation bilaterally,  normal work of breathing Heart- Regular rate and rhythm, no murmurs, rubs or gallops, PMI not laterally displaced Extremities- no clubbing, cyanosis, or edema Skin - no rash or ecchymosis noted   EKG today demonstrates  EKG Interpretation Date/Time:  Thursday July 22 2024 15:49:53 EST Ventricular Rate:  71 PR Interval:  184 QRS Duration:  88 QT Interval:  416 QTC Calculation: 452 R Axis:   -38  Text Interpretation: Normal sinus rhythm with sinus arrhythmia Left axis deviation Abnormal ECG When compared with ECG of 20-May-2024 11:38,  PREVIOUS ECG IS PRESENT Confirmed by [name] ([id number]) on 07/22/2024 3:51:27 PM    Echo 04/08/2019 demonstrated  1. The left ventricle has low normal systolic function, with an ejection  fraction of 50-55%. The cavity size was normal. Left ventricular diastolic  Doppler parameters are consistent with impaired relaxation.   2. The right ventricle has normal systolic function. The cavity was  normal.   3. The mitral valve is grossly normal.   4. The tricuspid valve is grossly normal.   5. The aortic valve is tricuspid. Mild thickening of the aortic valve. No  stenosis of the aortic valve.   6. The aorta is abnormal in size and structure.   7. There is mild dilatation of the ascending aorta measuring 38 mm.   8. The inferior vena cava was dilated in size with >50% respiratory  variability.   9. Normal LV systolic function; mild diastolic dysfunction; mildly  dilated ascending aorta.   ASSESSMENT & PLAN CHA2DS2-VASc Score = 4  The patient's score is based upon: CHF History: 0 HTN History: 1 Diabetes History: 0 Stroke History: 0 Vascular Disease History: 0 Age Score: 2 Gender Score: 1       ASSESSMENT AND PLAN: Paroxysmal Atrial Fibrillation (ICD10:  I48.0) The patient's CHA2DS2-VASc score is 4, indicating a 4.8% annual risk of stroke.   S/p Afib/flutter ablation on 04/21/24 by Dr. Inocencio.  Patient is currently in NSR. Patient already stopped flecainide , so will discontinue medication since we are at the conclusion of the blanking period. Reassurance provided to patient that with ECG today there is no new arrhythmia.  During our conversation in office, patient noted to feel the palpitations occurring at that moment.  I auscultated the patient and noted that I could not detect any significant tachycardia, ectopy, or pauses.  I do not have a specific etiology as to why sudden increase in heart rate.  After discussion with patient, will place cardiac monitor for 1 week to assess heart  rate. Continue Bystolic  2.5 mg BID.  Secondary Hypercoagulable State (ICD10:  D68.69) The patient is at significant risk for stroke/thromboembolism based upon her CHA2DS2-VASc Score of 4.  Continue Apixaban  (Eliquis ).   No missed doses.     Follow up 6 months with Dr. Inocencio.     [name], [facility]  Afib Clinic [address] [place], [place] [address] [phone]

## 2024-07-22 NOTE — Telephone Encounter (Signed)
 Did speak with afib clinic who will call pt if they are able to work her in today.

## 2024-07-22 NOTE — Telephone Encounter (Signed)
  Patient c/o Palpitations:  STAT if patient reporting lightheadedness, shortness of breath, or chest pain  How long have you had palpitations/irregular HR/ Afib? Are you having the symptoms now? No   Are you currently experiencing lightheadedness, SOB or CP? No   Do you have a history of afib (atrial fibrillation) or irregular heart rhythm? Yes   Have you checked your BP or HR? (document readings if available):   Are you experiencing any other symptoms?    Patient said she's been feeling afib since last night and off and on this morning. Patient denied any  lightheadedness, SOB or CP. Patient sounds frantic and requesting to be seen today. I also warm transfer her call to afib clinic

## 2024-07-22 NOTE — Patient Instructions (Addendum)
 Stop Flecainide     Follow up with Dr Inocencio in 6 months

## 2024-07-23 ENCOUNTER — Ambulatory Visit (HOSPITAL_COMMUNITY): Admitting: Internal Medicine

## 2024-08-02 ENCOUNTER — Other Ambulatory Visit: Payer: Self-pay | Admitting: Cardiology

## 2024-08-02 DIAGNOSIS — I4892 Unspecified atrial flutter: Secondary | ICD-10-CM

## 2024-08-02 NOTE — Telephone Encounter (Signed)
 Prescription refill request for Eliquis  received. Indication:afib Last office visit:11/25 Scr: 0.85  8/25 Age:78 Weight:64.1  kg  Prescription refilled

## 2024-08-04 ENCOUNTER — Encounter (INDEPENDENT_AMBULATORY_CARE_PROVIDER_SITE_OTHER): Payer: Self-pay | Admitting: Otolaryngology

## 2024-08-04 ENCOUNTER — Ambulatory Visit (INDEPENDENT_AMBULATORY_CARE_PROVIDER_SITE_OTHER): Admitting: Otolaryngology

## 2024-08-04 VITALS — BP 110/73 | HR 95 | Temp 98.4°F | Ht 66.0 in | Wt 137.0 lb

## 2024-08-04 DIAGNOSIS — R519 Headache, unspecified: Secondary | ICD-10-CM | POA: Diagnosis not present

## 2024-08-04 DIAGNOSIS — J310 Chronic rhinitis: Secondary | ICD-10-CM | POA: Diagnosis not present

## 2024-08-04 DIAGNOSIS — J343 Hypertrophy of nasal turbinates: Secondary | ICD-10-CM

## 2024-08-06 DIAGNOSIS — R519 Headache, unspecified: Secondary | ICD-10-CM | POA: Insufficient documentation

## 2024-08-06 NOTE — Progress Notes (Signed)
 Patient ID: Patricia Flynn, female   DOB: Jul 29, 1946, 78 y.o.   MRN: [id number]  Follow up: Recurrent sinusitis, headaches  Discussed the use of AI scribe software for clinical note transcription with the patient, who gave verbal consent to proceed.  History of Present Illness Patricia Flynn is a 78 year old female who presents with left temporal pain following a Botox injection.  The patient was previously seen for recurrent sinusitis, chronic nasal congestion, and bilateral inferior turbinate hypertrophy.  She has been experiencing intermittent left temporal pain for two to three weeks. The pain is episodic and occurs with head movements, particularly when shaking or turning her head.  She initially suspected sinus issues due to accompanying congestion and a weepy eye. No fever. She uses Flonase every morning, which she finds helpful for congestion.  Approximately three weeks ago, she received a Botox injection in the area where she is experiencing pain, raising the possibility of nerve involvement due to the procedure.  Exam: General: Communicates without difficulty, well nourished, no acute distress. Head: Normocephalic, no evidence injury, no tenderness, facial buttresses intact without stepoff. Face/sinus: No tenderness to palpation and percussion. Facial movement is normal and symmetric. Eyes: PERRL, EOMI. No scleral icterus, conjunctivae clear. Neuro: CN II exam reveals vision grossly intact.  No nystagmus at any point of gaze. Ears: Auricles well formed without lesions.  Ear canals are intact without mass or lesion.  No erythema or edema is appreciated.  The TMs are intact without fluid. Nose: External evaluation reveals normal support and skin without lesions.  Dorsum is intact.  Anterior rhinoscopy reveals congested mucosa over anterior aspect of inferior turbinates and intact septum.  No purulence noted. Oral:  Oral cavity and oropharynx are intact, symmetric, without erythema or edema.   Mucosa is moist without lesions. Neck: Full range of motion without pain.  There is no significant lymphadenopathy.  No masses palpable.  Thyroid  bed within normal limits to palpation.  Parotid glands and submandibular glands equal bilaterally without mass.  Trachea is midline. Neuro:  CN 2-12 grossly intact.    Assessment and Plan Assessment & Plan Temporal headache  Intermittent left temporal pain for 2-3 weeks, exacerbated by head movement. No fever or significant sinus involvement. Possible nerve involvement, potentially related to recent Botox injection.  - Continue to monitor symptoms for another month. - If symptoms persist, will order facial CT scan.  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 Mild nasal congestion. No significant sinus infection noted. Continues to use Flonase nasal spray daily, which helps reduce congestion. - Continue Flonase nasal spray daily.

## 2024-08-10 NOTE — Addendum Note (Signed)
 Encounter addended by: Janel Nancy SAUNDERS, RN on: 08/10/2024 2:10 PM  Actions taken: Imaging Exam ended

## 2024-08-10 NOTE — Addendum Note (Signed)
 Encounter addended by: Janel Nancy SAUNDERS, RN on: 08/10/2024 2:11 PM  Actions taken: Imaging Exam ended

## 2024-08-11 ENCOUNTER — Ambulatory Visit (HOSPITAL_COMMUNITY): Payer: Self-pay | Admitting: Internal Medicine

## 2024-08-19 ENCOUNTER — Other Ambulatory Visit: Payer: Self-pay | Admitting: Cardiology

## 2024-08-19 DIAGNOSIS — I4892 Unspecified atrial flutter: Secondary | ICD-10-CM

## 2024-08-19 MED ORDER — APIXABAN 5 MG PO TABS: 5.0000 mg | ORAL_TABLET | Freq: Two times a day (BID) | ORAL | 1 refills | Status: AC

## 2025-01-05 ENCOUNTER — Ambulatory Visit: Admitting: Cardiology

## 2025-01-11 ENCOUNTER — Ambulatory Visit (HOSPITAL_BASED_OUTPATIENT_CLINIC_OR_DEPARTMENT_OTHER): Admitting: Obstetrics & Gynecology
